# Patient Record
Sex: Female | Born: 1950 | Race: White | Hispanic: No | Marital: Married | Smoking: Never smoker
Health system: Southern US, Community
[De-identification: ages and names within clinical notes are randomized; demographics above are authoritative.]

## PROBLEM LIST (undated history)

## (undated) DIAGNOSIS — D759 Disease of blood and blood-forming organs, unspecified: Secondary | ICD-10-CM

## (undated) DIAGNOSIS — K219 Gastro-esophageal reflux disease without esophagitis: Secondary | ICD-10-CM

## (undated) DIAGNOSIS — D1803 Hemangioma of intra-abdominal structures: Secondary | ICD-10-CM

## (undated) DIAGNOSIS — Z9289 Personal history of other medical treatment: Secondary | ICD-10-CM

## (undated) DIAGNOSIS — I10 Essential (primary) hypertension: Secondary | ICD-10-CM

## (undated) DIAGNOSIS — Z87898 Personal history of other specified conditions: Secondary | ICD-10-CM

## (undated) DIAGNOSIS — C50919 Malignant neoplasm of unspecified site of unspecified female breast: Secondary | ICD-10-CM

## (undated) DIAGNOSIS — E1169 Type 2 diabetes mellitus with other specified complication: Secondary | ICD-10-CM

## (undated) DIAGNOSIS — M1990 Unspecified osteoarthritis, unspecified site: Secondary | ICD-10-CM

## (undated) DIAGNOSIS — Z87442 Personal history of urinary calculi: Secondary | ICD-10-CM

## (undated) DIAGNOSIS — E669 Obesity, unspecified: Secondary | ICD-10-CM

## (undated) HISTORY — DX: Obesity, unspecified: E66.9

## (undated) HISTORY — PX: DILATION AND CURETTAGE OF UTERUS: SHX78

## (undated) HISTORY — DX: Type 2 diabetes mellitus with other specified complication: E11.69

## (undated) HISTORY — PX: CATARACT EXTRACTION, BILATERAL: SHX1313

## (undated) HISTORY — PX: TONSILLECTOMY: SUR1361

## (undated) HISTORY — PX: LITHOTRIPSY: SUR834

## (undated) HISTORY — PX: OTHER SURGICAL HISTORY: SHX169

## (undated) HISTORY — DX: Malignant neoplasm of unspecified site of unspecified female breast: C50.919

## (undated) HISTORY — PX: ABDOMINAL HYSTERECTOMY: SHX81

---

## 2003-12-10 ENCOUNTER — Other Ambulatory Visit: Admission: RE | Admit: 2003-12-10 | Discharge: 2003-12-10 | Payer: Self-pay | Admitting: Family Medicine

## 2005-01-30 ENCOUNTER — Other Ambulatory Visit: Admission: RE | Admit: 2005-01-30 | Discharge: 2005-01-30 | Payer: Self-pay | Admitting: Family Medicine

## 2005-07-09 ENCOUNTER — Ambulatory Visit (HOSPITAL_COMMUNITY): Admission: RE | Admit: 2005-07-09 | Discharge: 2005-07-09 | Payer: Self-pay | Admitting: Gastroenterology

## 2005-07-09 ENCOUNTER — Encounter (INDEPENDENT_AMBULATORY_CARE_PROVIDER_SITE_OTHER): Payer: Self-pay | Admitting: Specialist

## 2006-02-13 ENCOUNTER — Other Ambulatory Visit: Admission: RE | Admit: 2006-02-13 | Discharge: 2006-02-13 | Payer: Self-pay | Admitting: Family Medicine

## 2006-08-09 ENCOUNTER — Ambulatory Visit (HOSPITAL_COMMUNITY): Admission: RE | Admit: 2006-08-09 | Discharge: 2006-08-09 | Payer: Self-pay | Admitting: *Deleted

## 2006-08-09 ENCOUNTER — Encounter (INDEPENDENT_AMBULATORY_CARE_PROVIDER_SITE_OTHER): Payer: Self-pay | Admitting: Specialist

## 2008-03-02 ENCOUNTER — Other Ambulatory Visit: Admission: RE | Admit: 2008-03-02 | Discharge: 2008-03-02 | Payer: Self-pay | Admitting: Family Medicine

## 2008-10-29 HISTORY — PX: DILATION AND CURETTAGE OF UTERUS: SHX78

## 2010-04-21 ENCOUNTER — Other Ambulatory Visit: Admission: RE | Admit: 2010-04-21 | Discharge: 2010-04-21 | Payer: Self-pay | Admitting: Family Medicine

## 2010-11-02 ENCOUNTER — Emergency Department (HOSPITAL_COMMUNITY)
Admission: EM | Admit: 2010-11-02 | Discharge: 2010-11-02 | Payer: Self-pay | Source: Home / Self Care | Admitting: Emergency Medicine

## 2010-11-02 LAB — URINE MICROSCOPIC-ADD ON

## 2010-11-02 LAB — URINALYSIS, ROUTINE W REFLEX MICROSCOPIC
Ketones, ur: NEGATIVE mg/dL
Nitrite: NEGATIVE
Protein, ur: NEGATIVE mg/dL
Urine Glucose, Fasting: NEGATIVE mg/dL
Urobilinogen, UA: 0.2 mg/dL (ref 0.0–1.0)
pH: 6 (ref 5.0–8.0)

## 2010-11-09 ENCOUNTER — Ambulatory Visit (HOSPITAL_COMMUNITY)
Admission: RE | Admit: 2010-11-09 | Discharge: 2010-11-09 | Payer: Self-pay | Source: Home / Self Care | Attending: Urology | Admitting: Urology

## 2010-11-22 ENCOUNTER — Encounter
Admission: RE | Admit: 2010-11-22 | Discharge: 2010-11-22 | Payer: Self-pay | Source: Home / Self Care | Attending: Family Medicine | Admitting: Family Medicine

## 2011-03-16 NOTE — Op Note (Signed)
NAMEPARISS, Morgan Frye               ACCOUNT NO.:  [id number]   MEDICAL RECORD NO.:  [id number]          PATIENT TYPE:  AMB   LOCATION:  ENDO                         FACILITY:  [facility]   PHYSICIAN:  Anselmo Rod, M.D.  DATE OF BIRTH:  1951/02/07   DATE OF PROCEDURE:  07/09/2005  DATE OF DISCHARGE:                                 OPERATIVE REPORT   PROCEDURE PERFORMED:  Screening colonoscopy.   ENDOSCOPIST:  Charna Elizabeth, M.D.   INSTRUMENT USED:  Olympus video colonoscope.   INDICATIONS FOR PROCEDURE:  A 60 year old white female undergoing a  screening colonoscopy to rule out colonic polyps, masses, etc.   PREPROCEDURE PREPARATION:  Informed consent was procured from the patient.  The patient was fasted for eight hours prior to the procedure and prepped  with a bottle of magnesium citrate and a gallon of GoLYTELY the night prior  to the procedure.  The risks and benefits of the procedure including a 10%  miss rate for colon polyps or cancers was discussed with the patient as  well.   PREPROCEDURE PHYSICAL:  The patient had stable vital signs.  Neck supple.  Chest clear to auscultation.  S1 and S2 regular.  Abdomen soft with normal  bowel sounds.   DESCRIPTION OF PROCEDURE:  The patient was placed in left lateral decubitus  position and sedated with 75 mg of Demerol and 10 mg of Versed in slow  incremental doses.  Once the patient was adequately sedated and maintained  on low flow oxygen and continuous cardiac monitoring, the Olympus video  colonoscope was advanced from the rectum to the cecum.  The appendicular  orifice and ileocecal valve appeared healthy.  Multiple washes were done.  The terminal ileum appeared normal.  Two small sessile polyps were removed  by cold biopsy forceps from the anal verge and the rectum.  An isolated  diverticulum was seen in the left colon.  The rest of the exam was  unremarkable.   IMPRESSION:  1.  Two small sessile polyps removed from the  rectum at the anal verge by      cold biopsy forceps (cold biopsies times two).  2.  Isolated diverticula with midleft colon.  3.  Normal-appearing transverse colon, right colon, cecum and terminal      ileum.   RECOMMENDATIONS:  1.  Await pathology results.  2.  Avoid all nonsteroidals including aspirin for the next two weeks.  3.  Repeat colonoscopy depending on pathology results.  4.  Outpatient followup as need arises in the future.  5.  A high fiber diet with liberal fluid intake has been emphasized.      Anselmo Rod, M.D.  Electronically Signed     JNM/MEDQ  D:  07/09/2005  T:  07/09/2005  Job:  [id number]   cc:   Otilio Connors. Gerri Spore, M.D.  [address]  [address]  [place]  [place] [address]  Fax: [phone]

## 2011-03-16 NOTE — Op Note (Signed)
[name], Morgan Frye               ACCOUNT NO.:  [id number]   MEDICAL RECORD NO.:  [id number]          PATIENT TYPE:  AMB   LOCATION:  SDC                           FACILITY:  [facility]   PHYSICIAN:  Pershing Cox, M.D.DATE OF BIRTH:  1951/09/27   DATE OF PROCEDURE:  08/09/2006  DATE OF DISCHARGE:                                 OPERATIVE REPORT   PREOPERATIVE DIAGNOSES:  1. Postmenopausal bleeding.  2. Endometrial polyp on hysterogram.   POSTOPERATIVE DIAGNOSIS:  Endometrial polyps arising from fundus.   OPERATION/PROCEDURE:  1. Exam under anesthesia.  2. Fractional dilatation and curettage.  3. Hysteroscopy with resection of polyp.   SURGEON:  Pershing Cox, M.D.   ASSISTANT:  None.   ANESTHESIA:  MAC by LMA and Marcaine paracervical block.   SPECIMENS:  Cervical curettings, combined endometrial polyp and endometrial  curettings.   INDICATIONS FOR PROCEDURE:  The patient has recently had some problems with  postmenopausal bleeding.  She is menopausal.  A sonogram performed by her  doctor, Dr. Clyde Canterbury showed an increased endometrial stripe and her  sonogram showed endometrial polyp arising from the fundus and after  counseling, she has agreed top present for resection of this polyp.  Endometrial biopsy performed on April 04, 2006 was benign.   DESCRIPTION OF PROCEDURE:  The patient was brought to the operating room  with an IV in place.  She received 400 mg of vancomycin in the holding area.  Supine on the OR table, IV sedation was administered.  LMA was placed  without difficulty.  Once totally asleep she was placed in the [place]  stirrups and prepped with Betadine in lower abdomen and perineum and vagina.  She was then draped for sterile vaginal procedure. and a collecting  deape__________ , was placed beneath her hip so that we could adequately  measure the _effluent_________ .  During the Betadine prep, I was able do an  exam under anesthesia with the finding of  a small anteflexed uterus and no  adnexal masses.   Bivalve speculum was inserted into the vagina.  Cervix was visualized and  0.25% Marcaine was injected into the anterior cervix which was then grasped  with a tenaculum.  Marcaine was injected at the 3, 4, 7, and 8 positions  using a total volume of 20 mL of 0.25% Marcaine.  A Kevorkian curet was used  to obtain endocervical curettings onto a Telfa and these were submitted  separately.  A sound passed to 9.25 cm.  Serial Pratt dilators were used to  dilate to size 15 and the diagnostic scope was introduced.  Photographs were  taken of the endometrial cavity and the decision was made to proceed with  resection.  The cervix was further dilated to size 31.  Resectoscope was  introduced and using through and through sorbitol irrigation, the cavity was  visualized using a single loop wire, the polyp fragments were resected.  The  large polyps were removed from the uterus with polyp forceps.  Once the  polyps had been resected, the small sharp curet was used to curet the  endometrial wall and the _meigs curette________ was used to do it likewise  again onto a separate Telfa.  There was no active bleeding even when the  tenaculum was removed.  The patient was taken to the recovery room in very  good condition.  Before completing the procedure, the uterus sounded again  to 9 cm.      Pershing Cox, M.D.  Electronically Signed     MAJ/MEDQ  D:  08/09/2006  T:  08/10/2006  Job:  [id number]   cc:   Otilio Connors. Gerri Spore, M.D.  Fax: [phone]

## 2011-04-25 ENCOUNTER — Other Ambulatory Visit: Payer: Self-pay | Admitting: Family Medicine

## 2011-04-25 DIAGNOSIS — R160 Hepatomegaly, not elsewhere classified: Secondary | ICD-10-CM

## 2011-06-20 ENCOUNTER — Other Ambulatory Visit: Payer: Self-pay

## 2011-06-22 ENCOUNTER — Ambulatory Visit
Admission: RE | Admit: 2011-06-22 | Discharge: 2011-06-22 | Disposition: A | Discharge status: Home / Self Care | Payer: BC Managed Care – PPO | Source: Ambulatory Visit | Attending: Family Medicine | Admitting: Family Medicine

## 2011-06-22 ENCOUNTER — Other Ambulatory Visit: Payer: Self-pay | Admitting: Family Medicine

## 2011-06-22 DIAGNOSIS — R160 Hepatomegaly, not elsewhere classified: Secondary | ICD-10-CM

## 2011-07-07 IMAGING — US US ABDOMEN COMPLETE
1 series · 13 of 25 positions shown · non-contrast
Comparison: CT from [HOSPITAL] on 08/18/2010

CLINICAL DATA: Liver mass seen on outside CT.  Nephrolithiasis.

ABDOMINAL ULTRASOUND COMPLETE

[Series 1: us abdomen complete · 13 of 91 slices shown]
[im 1/91]
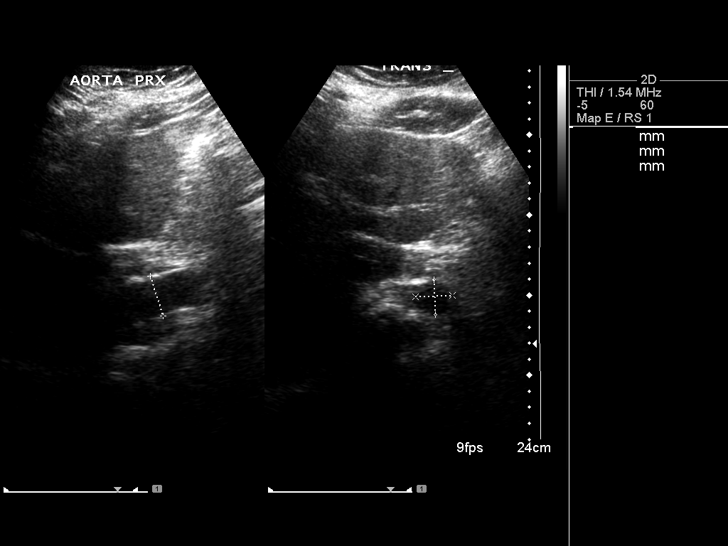
[im 8/91]
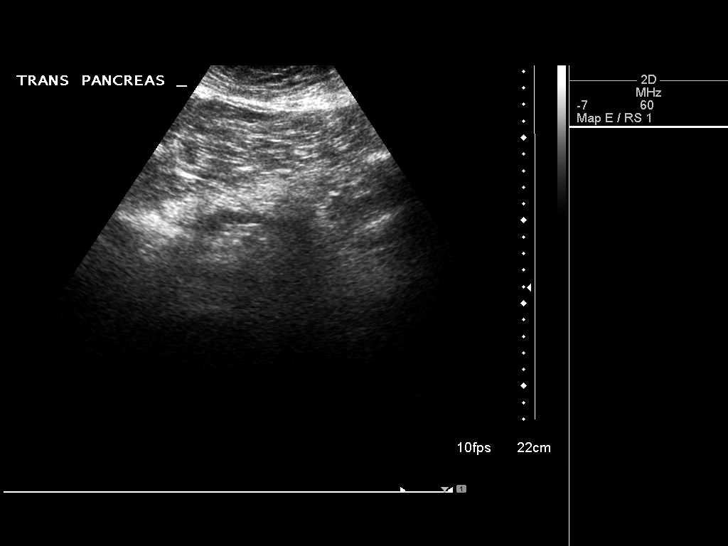
[im 16/91]
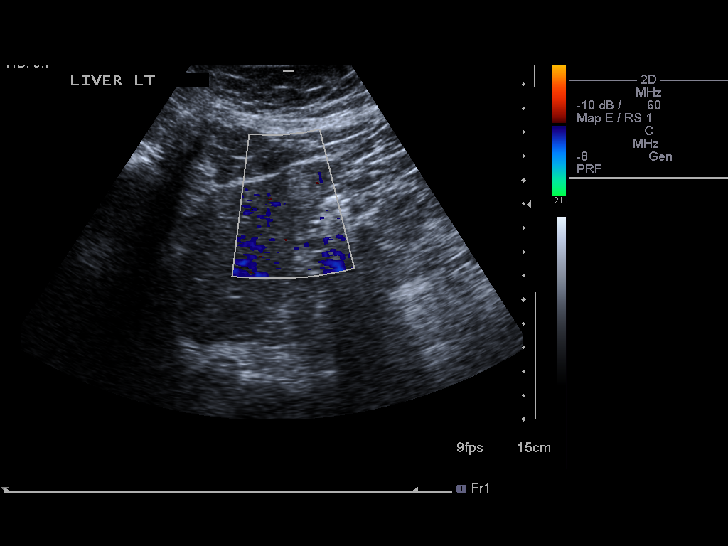
[im 23/91]
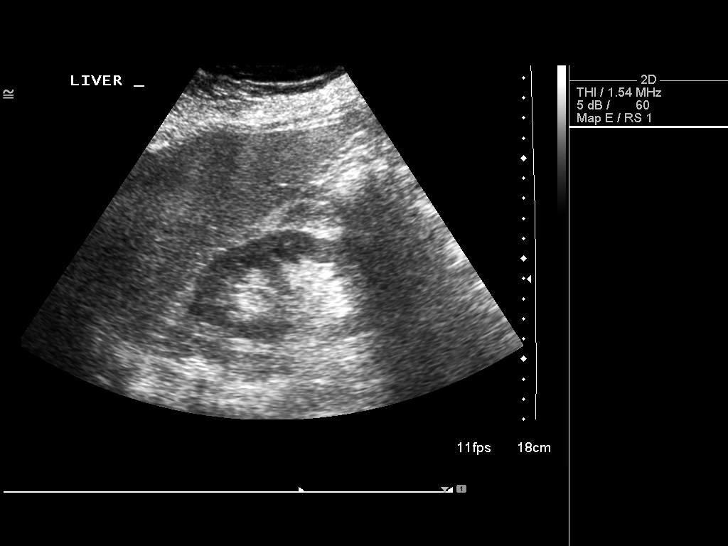
[im 31/91]
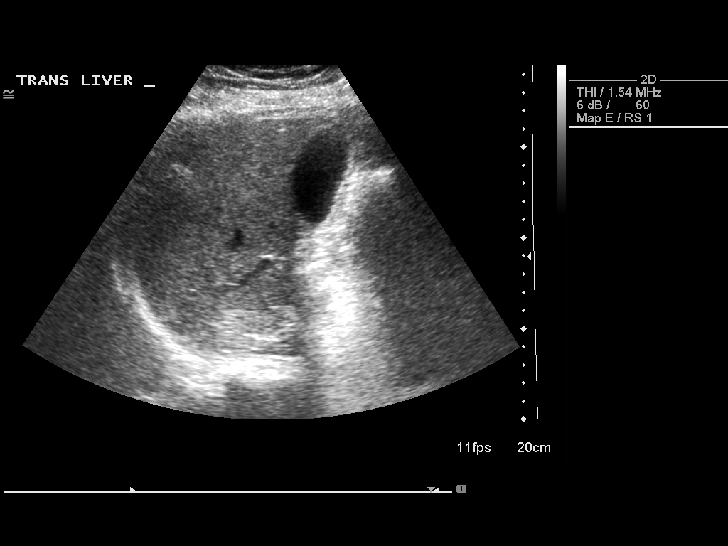
[im 38/91]
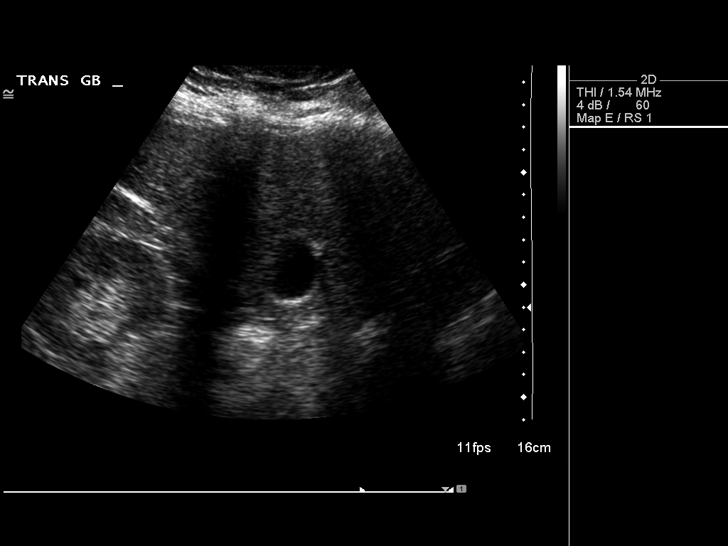
[im 46/91]
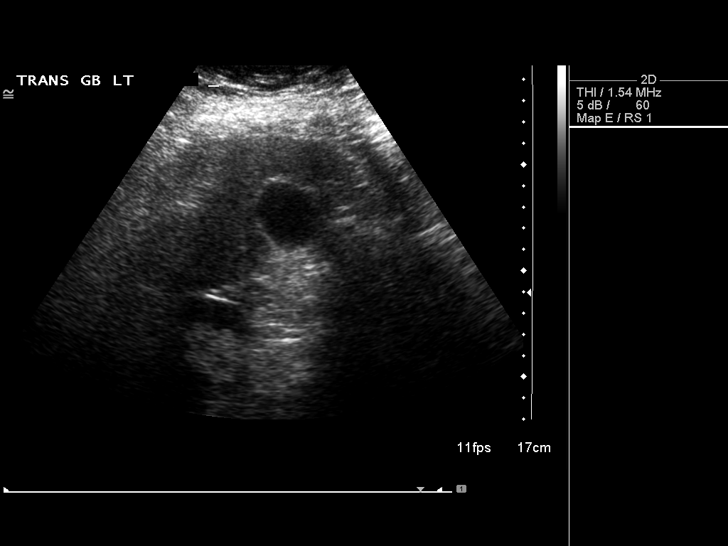
[im 53/91]
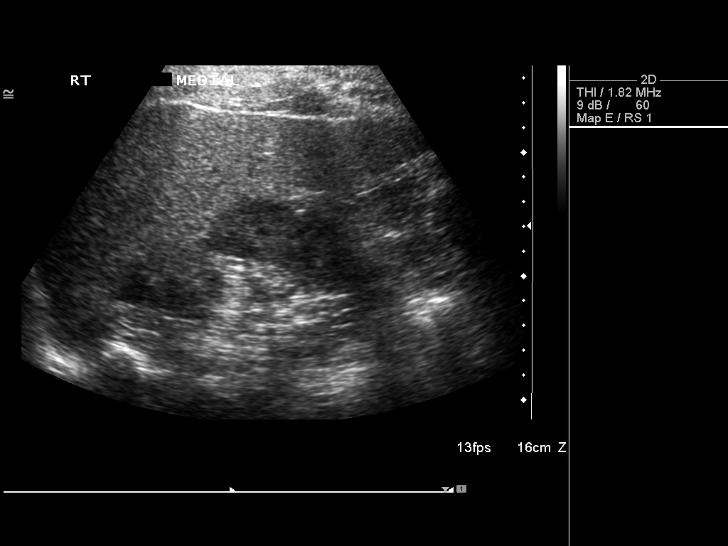
[im 61/91]
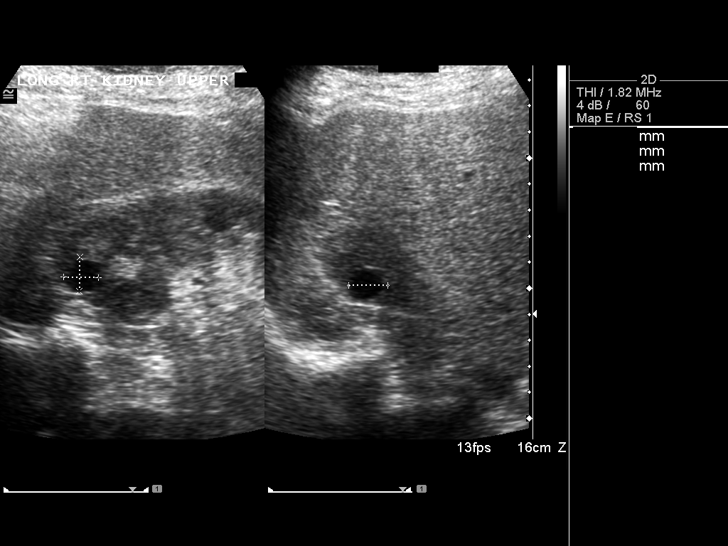
[im 68/91]
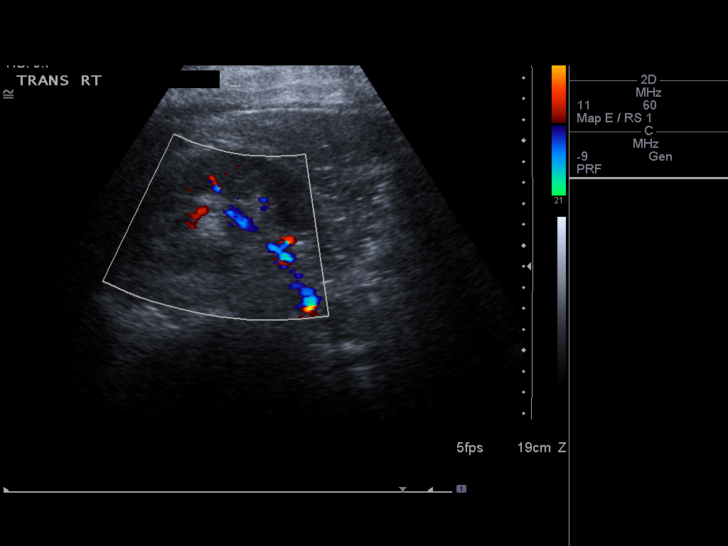
[im 76/91]
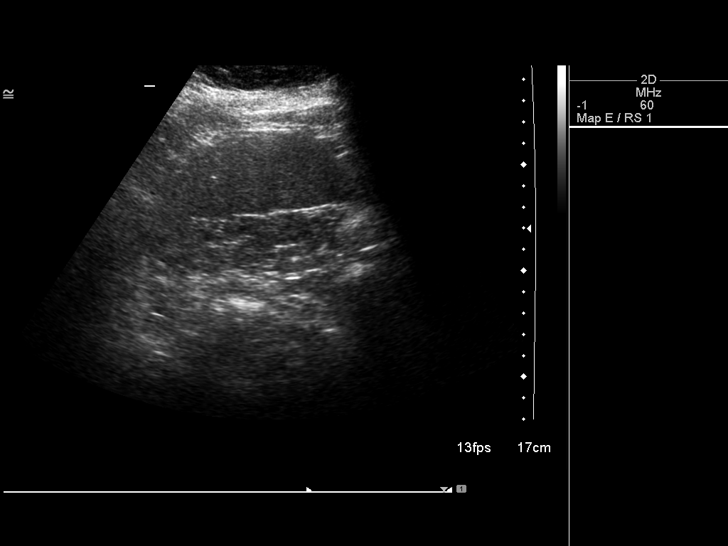
[im 83/91]
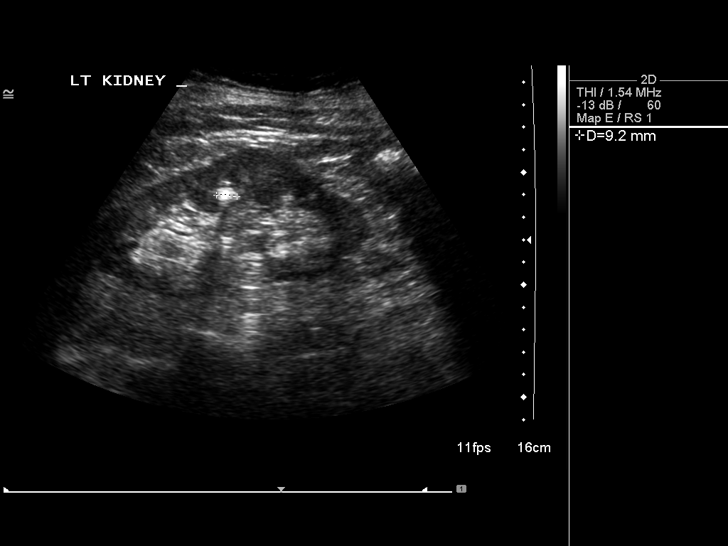
[im 91/91]
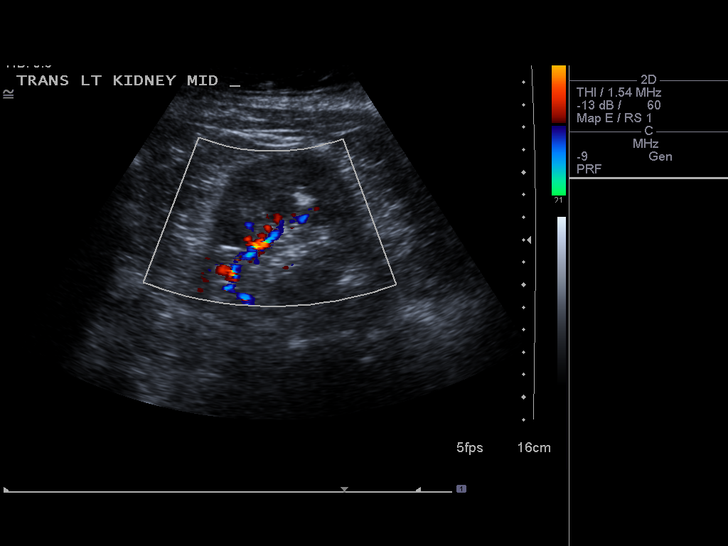

[13 of 25 positions shown; findings below may reference images not displayed]

FINDINGS: Gallbladder:  No gallstones, gallbladder wall thickening, or
pericholecystic fluid.

Common Bile Duct:  Within normal limits in caliber.  Measures 3 mm
in diameter.

Liver: A hyperechoic lesion is seen in the lateral segment of the
left hepatic lobe which measures 2.3 cm.  This corresponds to the
lesion seen on recent CT, although it measures larger than the
cm on that exam.  No other liver masses are identified.

IVC:  Appears normal.

Pancreas:  No abnormality identified.

Spleen:  Within normal limits in size and echotexture.

Right kidney:  Normal in size and parenchymal echogenicity.  No
evidence of mass or hydronephrosis.  A small less than 1 cm
calculus noted in the mid pole and 1.5 cm cysts seen in the upper
pole.

Left kidney:  Normal in size and parenchymal echogenicity.  No
evidence of mass or hydronephrosis.  A 1 cm calculus is seen in the
mid pole.

Abdominal Aorta:  No aneurysm identified.
IMPRESSION: 1.  2.3 cm hyperechoic lesion in the lateral segment of the left
hepatic lobe, consistent with a benign hemangioma.  This measures
larger than on the previous CT, however, this likely due to
technical differences between the two exams.  Follow-up by
ultrasound is recommended in 6 months to confirm stability over a
longer period of time.
2.  Bilateral nephrolithiasis.  No evidence of hydronephrosis.

## 2012-02-04 IMAGING — US US ABDOMEN LIMITED
1 series · 14 of 24 positions shown · non-contrast
Comparison: Ultrasound 11/22/2010 and CT [DATE]

CLINICAL DATA: Follow-up liver lesion seen on prior exams

LIMITED ABDOMINAL ULTRASOUND

[Series 1: us abdomen limited · 0.24mm/px · 14 of 24 slices shown]
[im 1/24]
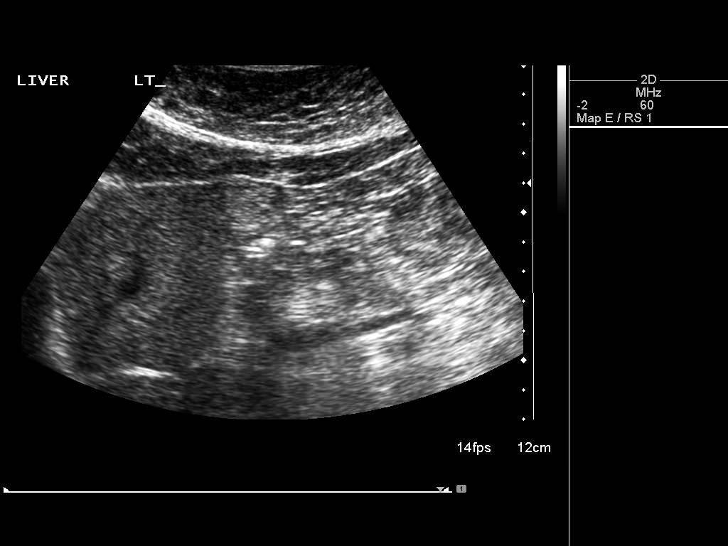
[im 3/24]
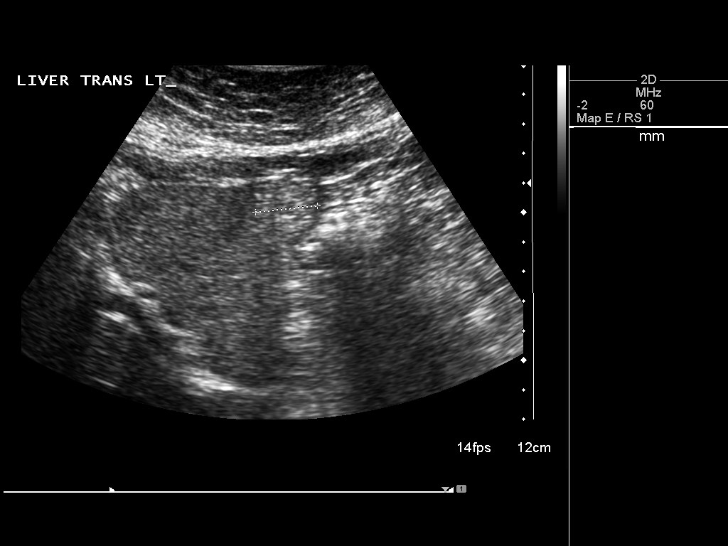
[im 5/24]
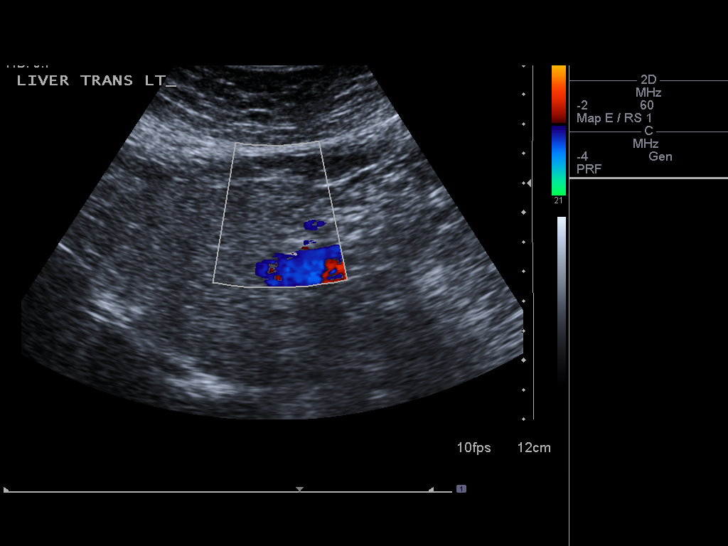
[im 7/24]
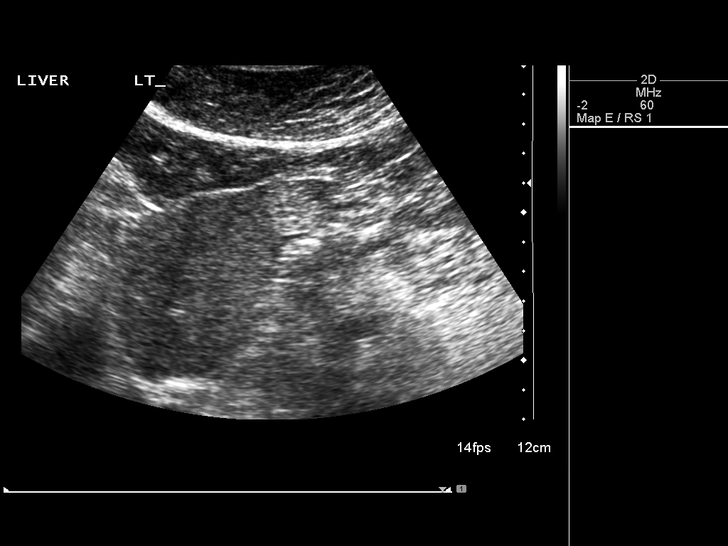
[im 8/24]
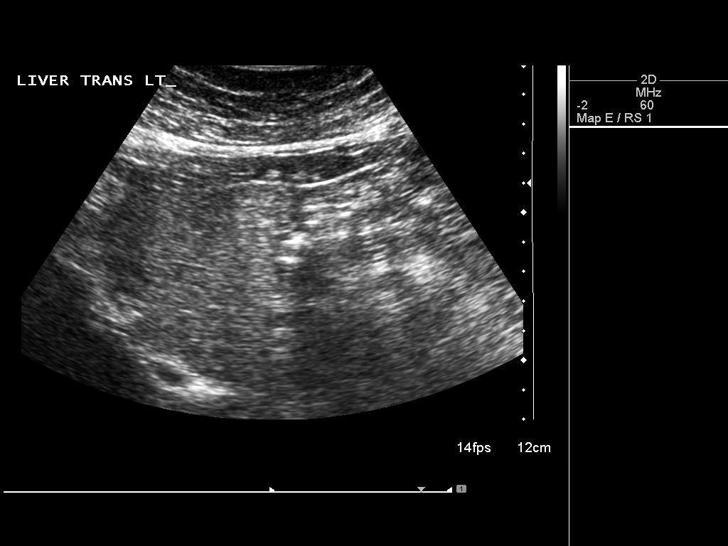
[im 10/24]
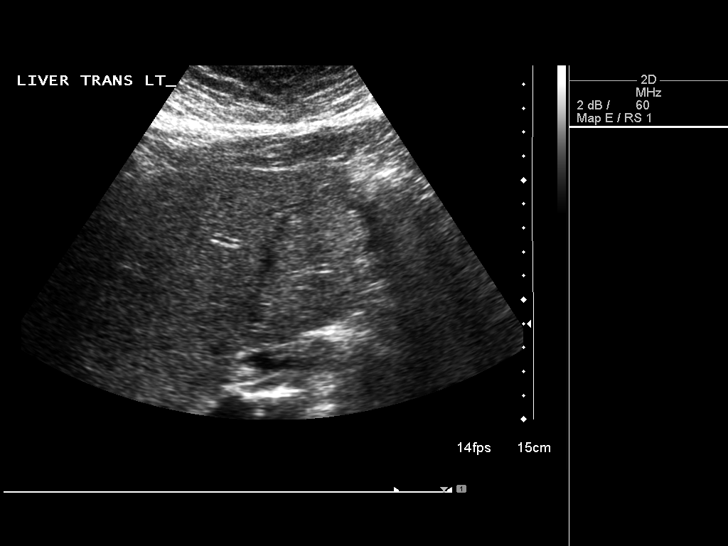
[im 12/24]
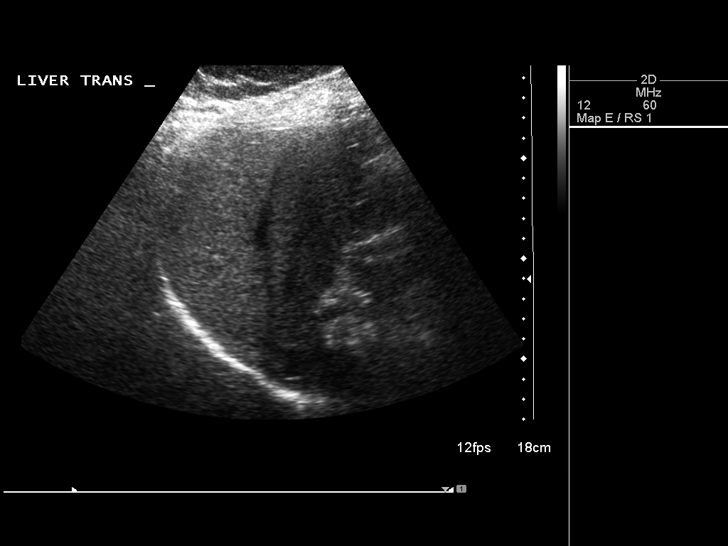
[im 13/24]
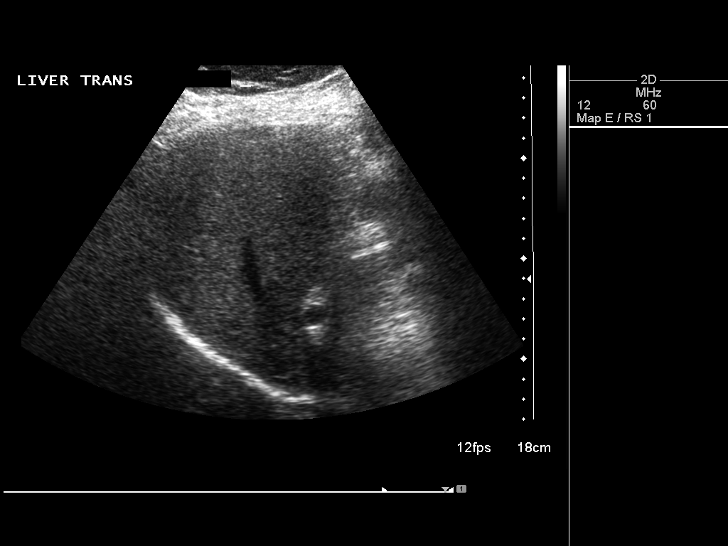
[im 15/24]
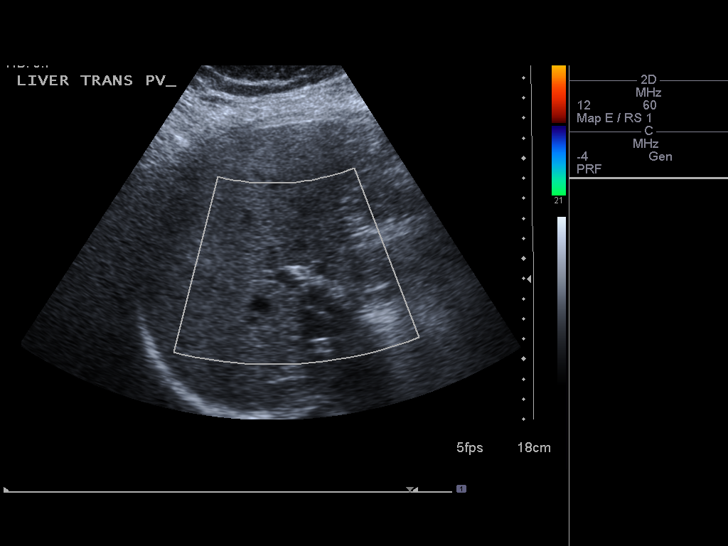
[im 17/24]
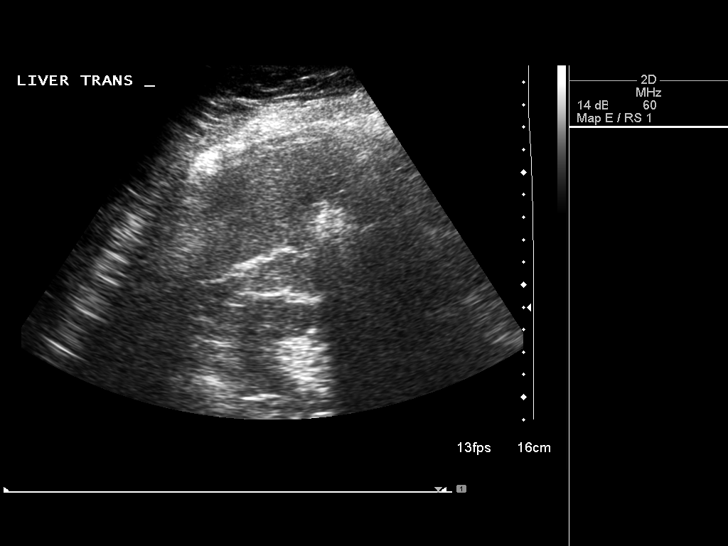
[im 19/24]
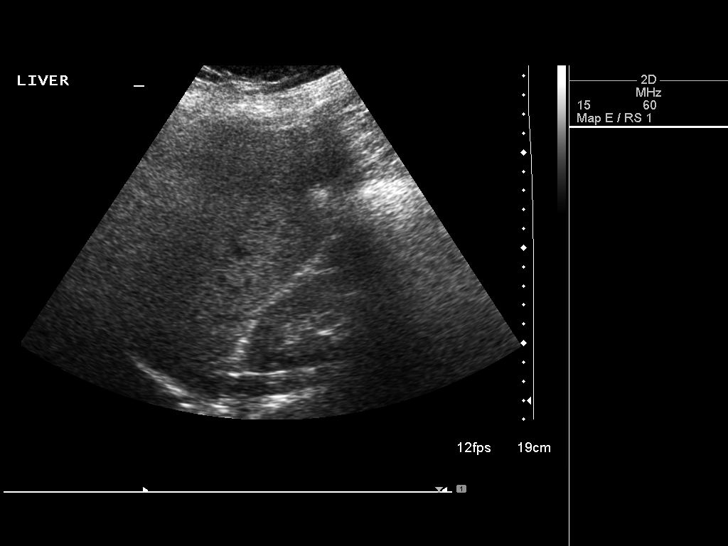
[im 20/24]
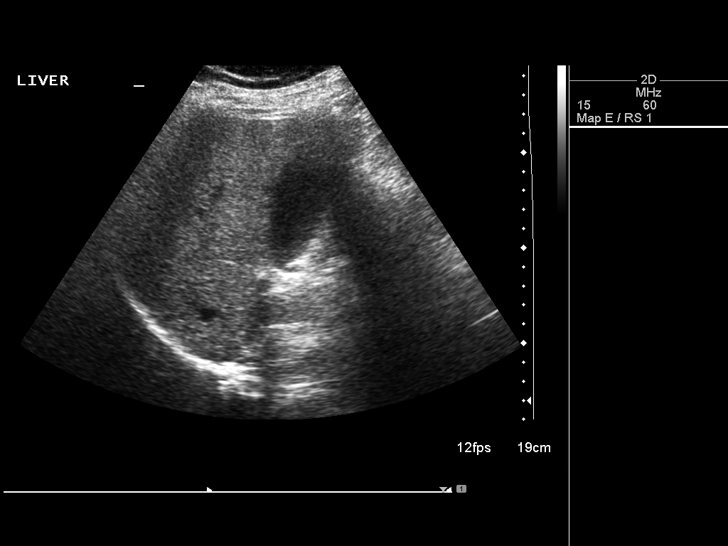
[im 22/24]
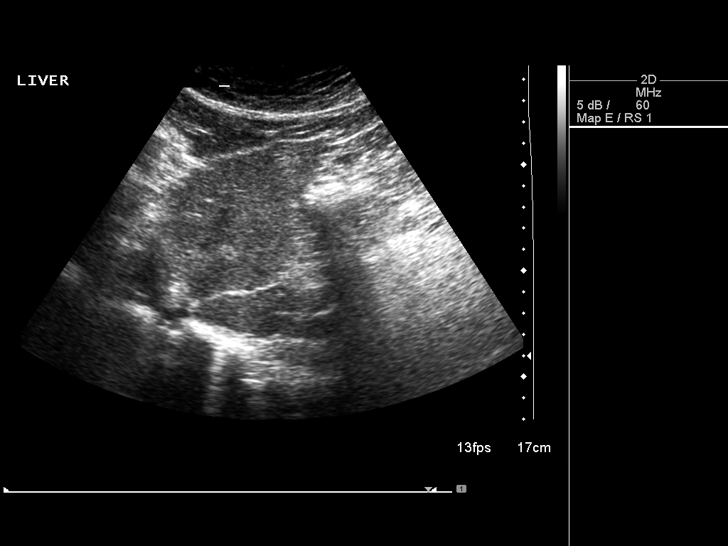
[im 24/24]
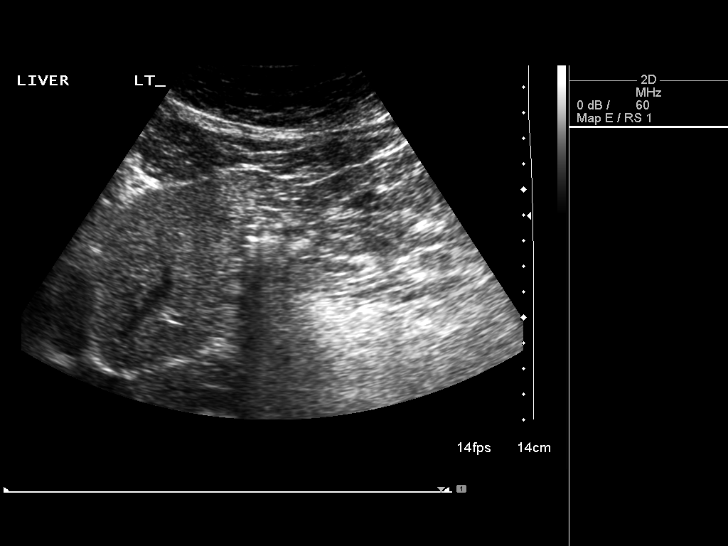

[14 of 24 positions shown; findings below may reference images not displayed]

FINDINGS: Again noted is an echogenic mass in the left lobe of the
liver which measures 2.2 x 1.8 x 2.3 cm when measured in a similar
plain to the prior study.  This is stable in comparison and would
correlate with the previous impression of a hepatic hemangioma. No
new focal hepatic lesions are identified.  No intrahepatic ductal
dilatation is seen.
IMPRESSION: Stable echogenic focus in the left lobe of the liver as noted above
most compatible with a benign hepatic hemangioma. If there is no
underlying history of cancer, no further follow-up is necessary.

## 2012-05-13 ENCOUNTER — Other Ambulatory Visit (HOSPITAL_COMMUNITY)
Admission: RE | Admit: 2012-05-13 | Discharge: 2012-05-13 | Disposition: A | Discharge status: Home / Self Care | Payer: BC Managed Care – PPO | Source: Ambulatory Visit | Attending: Family Medicine | Admitting: Family Medicine

## 2012-05-13 ENCOUNTER — Other Ambulatory Visit: Payer: Self-pay | Admitting: Family Medicine

## 2012-05-13 DIAGNOSIS — Z124 Encounter for screening for malignant neoplasm of cervix: Secondary | ICD-10-CM | POA: Insufficient documentation

## 2014-03-22 ENCOUNTER — Other Ambulatory Visit: Payer: Self-pay | Admitting: Orthopedic Surgery

## 2014-04-05 ENCOUNTER — Inpatient Hospital Stay: Admit: 2014-04-05 | Payer: Self-pay | Admitting: Orthopedic Surgery

## 2014-04-05 SURGERY — ARTHROPLASTY, KNEE, TOTAL
Anesthesia: Choice | Site: Knee | Laterality: Right

## 2014-05-05 ENCOUNTER — Encounter (HOSPITAL_COMMUNITY): Payer: Self-pay | Admitting: Pharmacy Technician

## 2014-05-12 ENCOUNTER — Encounter (HOSPITAL_COMMUNITY)
Admission: RE | Admit: 2014-05-12 | Discharge: 2014-05-12 | Disposition: A | Discharge status: Home / Self Care | Payer: BC Managed Care – PPO | Source: Ambulatory Visit | Attending: Orthopedic Surgery | Admitting: Orthopedic Surgery

## 2014-05-12 ENCOUNTER — Ambulatory Visit (HOSPITAL_COMMUNITY)
Admission: RE | Admit: 2014-05-12 | Discharge: 2014-05-12 | Disposition: A | Discharge status: Home / Self Care | Payer: BC Managed Care – PPO | Source: Ambulatory Visit | Attending: Orthopedic Surgery | Admitting: Orthopedic Surgery

## 2014-05-12 ENCOUNTER — Encounter (HOSPITAL_COMMUNITY): Payer: Self-pay

## 2014-05-12 DIAGNOSIS — I10 Essential (primary) hypertension: Secondary | ICD-10-CM | POA: Insufficient documentation

## 2014-05-12 DIAGNOSIS — Z01818 Encounter for other preprocedural examination: Secondary | ICD-10-CM | POA: Insufficient documentation

## 2014-05-12 DIAGNOSIS — Z01810 Encounter for preprocedural cardiovascular examination: Secondary | ICD-10-CM | POA: Insufficient documentation

## 2014-05-12 DIAGNOSIS — Z01812 Encounter for preprocedural laboratory examination: Secondary | ICD-10-CM | POA: Insufficient documentation

## 2014-05-12 HISTORY — DX: Personal history of other medical treatment: Z92.89

## 2014-05-12 HISTORY — DX: Unspecified osteoarthritis, unspecified site: M19.90

## 2014-05-12 HISTORY — DX: Personal history of urinary calculi: Z87.442

## 2014-05-12 HISTORY — DX: Hemangioma of intra-abdominal structures: D18.03

## 2014-05-12 HISTORY — DX: Gastro-esophageal reflux disease without esophagitis: K21.9

## 2014-05-12 HISTORY — DX: Personal history of other specified conditions: Z87.898

## 2014-05-12 HISTORY — DX: Essential (primary) hypertension: I10

## 2014-05-12 LAB — APTT: aPTT: 31 seconds (ref 24–37)

## 2014-05-12 LAB — PROTIME-INR
INR: 1.32 (ref 0.00–1.49)
Prothrombin Time: 16.4 seconds — ABNORMAL HIGH (ref 11.6–15.2)

## 2014-05-12 LAB — URINALYSIS, ROUTINE W REFLEX MICROSCOPIC
Glucose, UA: NEGATIVE mg/dL
Hgb urine dipstick: NEGATIVE
Ketones, ur: NEGATIVE mg/dL
NITRITE: NEGATIVE
PH: 6 (ref 5.0–8.0)
Protein, ur: NEGATIVE mg/dL
SPECIFIC GRAVITY, URINE: 1.021 (ref 1.005–1.030)
Urobilinogen, UA: 0.2 mg/dL (ref 0.0–1.0)

## 2014-05-12 LAB — CBC
HCT: 47.3 % — ABNORMAL HIGH (ref 36.0–46.0)
Hemoglobin: 17 g/dL — ABNORMAL HIGH (ref 12.0–15.0)
MCH: 33.2 pg (ref 26.0–34.0)
MCHC: 35.9 g/dL (ref 30.0–36.0)
MCV: 92.4 fL (ref 78.0–100.0)
PLATELETS: 180 10*3/uL (ref 150–400)
RBC: 5.12 MIL/uL — AB (ref 3.87–5.11)
RDW: 12.8 % (ref 11.5–15.5)
WBC: 5.4 10*3/uL (ref 4.0–10.5)

## 2014-05-12 LAB — COMPREHENSIVE METABOLIC PANEL
ALT: 82 U/L — AB (ref 0–35)
AST: 59 U/L — AB (ref 0–37)
Albumin: 4.1 g/dL (ref 3.5–5.2)
Alkaline Phosphatase: 89 U/L (ref 39–117)
Anion gap: 15 (ref 5–15)
BUN: 18 mg/dL (ref 6–23)
CALCIUM: 10.3 mg/dL (ref 8.4–10.5)
CO2: 23 meq/L (ref 19–32)
Chloride: 105 mEq/L (ref 96–112)
Creatinine, Ser: 0.71 mg/dL (ref 0.50–1.10)
GFR calc Af Amer: >90 mL/min (ref >90)
GFR calc non Af Amer: >90 mL/min (ref >90)
Glucose, Bld: 136 mg/dL — ABNORMAL HIGH (ref 70–99)
Potassium: 4 mEq/L (ref 3.7–5.3)
SODIUM: 143 meq/L (ref 137–147)
Total Bilirubin: 0.9 mg/dL (ref 0.3–1.2)
Total Protein: 7.3 g/dL (ref 6.0–8.3)

## 2014-05-12 LAB — URINE MICROSCOPIC-ADD ON

## 2014-05-12 LAB — SURGICAL PCR SCREEN
MRSA, PCR: NEGATIVE
Staphylococcus aureus: NEGATIVE

## 2014-05-12 NOTE — Pre-Procedure Instructions (Addendum)
05-12-14 EKG/CXR done today. Clearance note 5'15 Dr. Justin Mend- with chart. Labs viewable in epic, note urinalysis. 05-20-14 1401 faxed note from Dr. Anne Fu office" no Action"-labs noted.W.Teigen Bellin,RN

## 2014-05-12 NOTE — Progress Notes (Signed)
05-12-14 [place] in [place], note urinalysis.

## 2014-05-12 NOTE — Patient Instructions (Signed)
20 Morgan Frye  05/12/2014   Your procedure is scheduled on: 7-27  -2015  Enter through [facility] Entrance and follow signs to [facility]. Arrive at     0515   AM .  Call this number if you have problems the morning of surgery: [phone]  Or Presurgical Testing [phone](Morgan Frye) For Living Will and/or Health Care Power Attorney Forms: please provide copy for your medical record,may bring AM of surgery(Forms should be already notarized -we do not provide this service).(05-12-14 Yes/ will bring Living Will if not connected to financial will -05-24-14).     Do not eat food:After Midnight.    Take these medicines the morning of surgery with A SIP OF WATER: Tylenol if needed.   Do not wear jewelry, make-up or nail polish.  Do not wear lotions, powders, or perfumes. You may wear deodorant.  Do not shave 48 hours(2 days) prior to first CHG shower(legs and under arms).(Shaving face and neck okay.)  Do not bring valuables to the hospital.(Hospital is not responsible for lost valuables).  Contacts, dentures or removable bridgework, body piercing, hair pins may not be worn into surgery.  Leave suitcase in the car. After surgery it may be brought to your room.  For patients admitted to the hospital, checkout time is 11:00 AM the day of discharge.(Restricted visitors-Any Persons displaying flu-like symptoms or illness).    Patients discharged the day of surgery will not be allowed to drive home. Must have responsible person with you x 24 hours once discharged.  Name and phone number of your driver: Morgan Frye, spouse [phone] cell  Special Instructions: CHG(Chlorhedine 4%-"Hibiclens","Betasept","Aplicare") Shower Use Special Wash: see special instructions.(avoid face and genitals)   Please read over the following fact sheets that you were given: MRSA Information, Blood Transfusion fact sheet, Incentive Spirometry Instruction.  Remember : Type/Screen "Blue armbands" -  may not be removed once applied(would result in being retested AM of surgery, if removed).    ___________________________    [facility] - Preparing for Surgery Before surgery, you can play an important role.  Because skin is not sterile, your skin needs to be as free of germs as possible.  You can reduce the number of germs on your skin by washing with CHG (chlorahexidine gluconate) soap before surgery.  CHG is an antiseptic cleaner which kills germs and bonds with the skin to continue killing germs even after washing. Please DO NOT use if you have an allergy to CHG or antibacterial soaps.  If your skin becomes reddened/irritated stop using the CHG and inform your nurse when you arrive at Short Stay. Do not shave (including legs and underarms) for at least 48 hours prior to the first CHG shower.  You may shave your face/neck. Please follow these instructions carefully:  1.  Shower with CHG Soap the night before surgery and the  morning of Surgery.  2.  If you choose to wash your hair, wash your hair first as usual with your  normal  shampoo.  3.  After you shampoo, rinse your hair and body thoroughly to remove the  shampoo.                           4.  Use CHG as you would any other liquid soap.  You can apply chg directly  to the skin and wash  Gently with a scrungie or clean washcloth.  5.  Apply the CHG Soap to your body ONLY FROM THE NECK DOWN.   Do not use on face/ open                           Wound or open sores. Avoid contact with eyes, ears mouth and genitals (private parts).                       Wash face,  Genitals (private parts) with your normal soap.             6.  Wash thoroughly, paying special attention to the area where your surgery  will be performed.  7.  Thoroughly rinse your body with warm water from the neck down.  8.  DO NOT shower/wash with your normal soap after using and rinsing off  the CHG Soap.                9.  Pat yourself dry with a clean  towel.            10.  Wear clean pajamas.            11.  Place clean sheets on your bed the night of your first shower and do not  sleep with pets. Day of Surgery : Do not apply any lotions/deodorants the morning of surgery.  Please wear clean clothes to the hospital/surgery center.  FAILURE TO FOLLOW THESE INSTRUCTIONS MAY RESULT IN THE CANCELLATION OF YOUR SURGERY PATIENT SIGNATURE_________________________________  NURSE SIGNATURE__________________________________  ________________________________________________________________________   Morgan Frye  An incentive spirometer is a tool that can help keep your lungs clear and active. This tool measures how well you are filling your lungs with each breath. Taking long deep breaths may help reverse or decrease the chance of developing breathing (pulmonary) problems (especially infection) following:  A long period of time when you are unable to move or be active. BEFORE THE PROCEDURE   If the spirometer includes an indicator to show your best effort, your nurse or respiratory therapist will set it to a desired goal.  If possible, sit up straight or lean slightly forward. Try not to slouch.  Hold the incentive spirometer in an upright position. INSTRUCTIONS FOR USE  1. Sit on the edge of your bed if possible, or sit up as far as you can in bed or on a chair. 2. Hold the incentive spirometer in an upright position. 3. Breathe out normally. 4. Place the mouthpiece in your mouth and seal your lips tightly around it. 5. Breathe in slowly and as deeply as possible, raising the piston or the ball toward the top of the column. 6. Hold your breath for 3-5 seconds or for as long as possible. Allow the piston or ball to fall to the bottom of the column. 7. Remove the mouthpiece from your mouth and breathe out normally. 8. Rest for a few seconds and repeat Steps 1 through 7 at least 10 times every 1-2 hours when you are awake. Take your  time and take a few normal breaths between deep breaths. 9. The spirometer may include an indicator to show your best effort. Use the indicator as a goal to work toward during each repetition. 10. After each set of 10 deep breaths, practice coughing to be sure your lungs are clear. If you have an incision (the cut made at the time of  surgery), support your incision when coughing by placing a pillow or rolled up towels firmly against it. Once you are able to get out of bed, walk around indoors and cough well. You may stop using the incentive spirometer when instructed by your caregiver.  RISKS AND COMPLICATIONS  Take your time so you do not get dizzy or light-headed.  If you are in pain, you may need to take or ask for pain medication before doing incentive spirometry. It is harder to take a deep breath if you are having pain. AFTER USE  Rest and breathe slowly and easily.  It can be helpful to keep track of a log of your progress. Your caregiver can provide you with a simple table to help with this. If you are using the spirometer at home, follow these instructions: [place] IF:   You are having difficultly using the spirometer.  You have trouble using the spirometer as often as instructed.  Your pain medication is not giving enough relief while using the spirometer.  You develop fever of 100.5 F (38.1 C) or higher. SEEK IMMEDIATE MEDICAL CARE IF:   You cough up bloody sputum that had not been present before.  You develop fever of 102 F (38.9 C) or greater.  You develop worsening pain at or near the incision site. MAKE SURE YOU:   Understand these instructions.  Will watch your condition.  Will get help right away if you are not doing well or get worse. Document Released: 02/25/2007 Document Revised: 01/07/2012 Document Reviewed: 04/28/2007 Exit[employer] Patient Information 2014 [employer],  [place].   ________________________________________________________________________  WHAT IS A BLOOD TRANSFUSION? Blood Transfusion Information  A transfusion is the replacement of blood or some of its parts. Blood is made up of multiple cells which provide different functions.  Red blood cells carry oxygen and are used for blood loss replacement.  White blood cells fight against infection.  Platelets control bleeding.  Plasma helps clot blood.  Other blood products are available for specialized needs, such as hemophilia or other clotting disorders. BEFORE THE TRANSFUSION  Who gives blood for transfusions?   Healthy volunteers who are fully evaluated to make sure their blood is safe. This is blood bank blood. Transfusion therapy is the safest it has ever been in the practice of medicine. Before blood is taken from a donor, a complete history is taken to make sure that person has no history of diseases nor engages in risky social behavior (examples are intravenous drug use or sexual activity with multiple partners). The donor's travel history is screened to minimize risk of transmitting infections, such as malaria. The donated blood is tested for signs of infectious diseases, such as HIV and hepatitis. The blood is then tested to be sure it is compatible with you in order to minimize the chance of a transfusion reaction. If you or a relative donates blood, this is often done in anticipation of surgery and is not appropriate for emergency situations. It takes many days to process the donated blood. RISKS AND COMPLICATIONS Although transfusion therapy is very safe and saves many lives, the main dangers of transfusion include:   Getting an infectious disease.  Developing a transfusion reaction. This is an allergic reaction to something in the blood you were given. Every precaution is taken to prevent this. The decision to have a blood transfusion has been considered carefully by your caregiver  before blood is given. Blood is not given unless the benefits outweigh the risks. AFTER THE  TRANSFUSION  Right after receiving a blood transfusion, you will usually feel much better and more energetic. This is especially true if your red blood cells have gotten low (anemic). The transfusion raises the level of the red blood cells which carry oxygen, and this usually causes an energy increase.  The nurse administering the transfusion will monitor you carefully for complications. HOME CARE INSTRUCTIONS  No special instructions are needed after a transfusion. You may find your energy is better. Speak with your caregiver about any limitations on activity for underlying diseases you may have. SEEK MEDICAL CARE IF:   Your condition is not improving after your transfusion.  You develop redness or irritation at the intravenous (IV) site. SEEK IMMEDIATE MEDICAL CARE IF:  Any of the following symptoms occur over the next 12 hours:  Shaking chills.  You have a temperature by mouth above 102 F (38.9 C), not controlled by medicine.  Chest, back, or muscle pain.  People around you feel you are not acting correctly or are confused.  Shortness of breath or difficulty breathing.  Dizziness and fainting.  You get a rash or develop hives.  You have a decrease in urine output.  Your urine turns a dark color or changes to pink, red, or brown. Any of the following symptoms occur over the next 10 days:  You have a temperature by mouth above 102 F (38.9 C), not controlled by medicine.  Shortness of breath.  Weakness after normal activity.  The white part of the eye turns yellow (jaundice).  You have a decrease in the amount of urine or are urinating less often.  Your urine turns a dark color or changes to pink, red, or brown. Document Released: 10/12/2000 Document Revised: 01/07/2012 Document Reviewed: 05/31/2008 [facility] Patient Information 2014 [place],  [place].  _______________________________________________________________________

## 2014-05-13 ENCOUNTER — Other Ambulatory Visit: Payer: Self-pay | Admitting: Orthopedic Surgery

## 2014-05-23 ENCOUNTER — Other Ambulatory Visit: Payer: Self-pay | Admitting: Orthopedic Surgery

## 2014-05-23 NOTE — Anesthesia Preprocedure Evaluation (Addendum)
Anesthesia Evaluation  Patient identified by MRN, date of birth, ID band Patient awake    Reviewed: Allergy & Precautions, H&P , NPO status , Patient's Chart, lab work & pertinent test results  Airway Mallampati: III TM Distance: >3 FB Neck ROM: Full    Dental  (+) Teeth Intact, Dental Advisory Given   Pulmonary neg pulmonary ROS,  breath sounds clear to auscultation        Cardiovascular hypertension, Pt. on medications and Pt. on home beta blockers Rhythm:Regular Rate:Normal     Neuro/Psych negative neurological ROS  negative psych ROS   GI/Hepatic Neg liver ROS, GERD-  Medicated,  Endo/Other  Morbid obesity  Renal/GU negative Renal ROS  negative genitourinary   Musculoskeletal negative musculoskeletal ROS (+)   Abdominal   Peds  Hematology negative hematology ROS (+)   Anesthesia Other Findings   Reproductive/Obstetrics                          Anesthesia Physical Anesthesia Plan  ASA: III  Anesthesia Plan: Spinal   Post-op Pain Management:    Induction: Intravenous  Airway Management Planned: Simple Face Mask  Additional Equipment:   Intra-op Plan:   Post-operative Plan: Extubation in OR  Informed Consent: I have reviewed the patients History and Physical, chart, labs and discussed the procedure including the risks, benefits and alternatives for the proposed anesthesia with the patient or authorized representative who has indicated his/her understanding and acceptance.   Dental advisory given  Plan Discussed with: CRNA  Anesthesia Plan Comments:         Anesthesia Quick Evaluation

## 2014-05-23 NOTE — H&P (Signed)
Morgan Frye DOB: Jul 24, 1951 Married / Language: English / Race: White Female  Date of Admission:  05/24/2014  Chief Complaint:  Right knee pain  History of Present Illness The patient is a 63 year old female who comes in for a preoperative History and Physical. The patient is scheduled for a right total knee arthroplasty to be performed by Dr. Dione Plover. Aluisio, MD at [facility] on 05-24-2014. The patient is a 63 year old female who presents with knee complaints. The patient is seen for a second opinion (for right greater than left knee pain). The patient reports left knee and right knee symptoms including: pain which began year(s) ago without any known injury. Prior to being seen today the patient was previously evaluated by a colleague (Dr. Telford Nab) year(s) ago. Previous work-up for this problem has included knee x-rays. Past treatment for this problem has included intra-articular injection of corticosteroids (last one done 5-6 years ago). Management changes made at the last visit include adding Etodolac. Morgan Frye comes in today for evaluation for both of her knees. The right knee seems to be more problematic than the left. The right knee originally started up about nine years ago and was seen by Dr. Telford Nab and received a cortisone injection which relieved her pain. it occurred again about 4 years later and received the same treatment.  She gives a history of both of her parents having rheumatoid arhtrits and knew that she would "just have to live with it" meaing stiff and painful joints. She was placed on etodolac and has been on it for 6-7 years now. She gets blood work every six months thru her PCP.  The left knee tends to be more stiff but the right knee has started to have pain at time. There is some popping when she gets up and the left knee witll buckle at times. She has been favoring off the right knee but denies any swelling or licking of the knee. She  states that there has been some loss of motion with her knees and that she cann only go up and down steps one at a time. Sideways movement is more difficult now also.  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 The knees have become progressively worse over time. The right knee hurts more than the left. It is definitely limiting what she can and cannot do. She has had injections in the past without any long lasting benefit. She said her function has become so bad that she is at a stage where she feels like she needs to get the knee fixed. The left knee historically has not given her much trouble until recently when she has had progressively worsening problems with that, but her right knee is far worse at this time. They have been treated conservatively in the past for the above stated problem and despite conservative measures, they continue to have progressive pain and severe functional limitations and dysfunction. They have failed non-operative management including home exercise, medications. It is felt that they would benefit from undergoing total joint replacement. Risks and benefits of the procedure have been discussed with the patient and they elect to proceed with surgery. There are no active contraindications to surgery such as ongoing infection or rapidly progressive neurological disease.   Allergies Penicillins. Hives, Swelling. Childhood RXN  Problem List/Past Medical Primary osteoarthritis of both knees (715.16  M17.0) High blood pressure Hypercholesterolemia  Arrhythmia Gastroesophageal Reflux Disease Osteoarthritis Kidney Stone Menopause Measles Rubella Scarlet Fever  Family History Cancer. Father. Cerebrovascular Accident. Paternal Grandmother. Hypertension. Father, Mother, Paternal Grandmother. Liver Disease, Chronic. Maternal Grandmother, Mother. Osteoarthritis. Mother. Rheumatoid Arthritis.  Father.  Social History Living situation. live with spouse Marital status. married Most recent primary occupation. Realtor Exercise. Exercises rarely; does other Children. 1 Current drinker. 11/11/2013: Currently drinks wine only occasionally per week Current work status. working full time Tobacco use. Never smoker. 11/11/2013 Tobacco / smoke exposure. 11/11/2013: no No history of drug/alcohol rehab Not under pain contract Number of flights of stairs before winded. 2-3 Post-Surgical Plans. Insurance underwriter. Living Will, Healthcare POA  Medication History Lisinopril-Hydrochlorothiazide (20-12.5MG  Tablet, Oral) Active. Etodolac ER (400MG  Tablet ER 24HR, Oral) Active. Toprol XL (50MG  Tablet ER, Oral) Active. Lipitor (20MG  Tablet, Oral) Active. Glucosamine Chondroitin Complx ( Oral) Active. Multivitamin ( Oral) Active. Calcium Carbonate (600MG  Tablet, Oral) Active. Omega 3 ( Oral) Specific dose unknown - Active. Vitamin D (400UNIT Tablet, Oral) Active. Aspirin EC (325MG  Tablet DR, Oral) Active.  Past Surgical History Cataract Surgery. bilateral Dilation and Curettage of Uterus Tonsillectomy  Review of Systems General:Not Present- Chills, Fever, Night Sweats, Fatigue, Weight Gain, Weight Loss and Memory Loss. Skin:Not Present- Hives, Itching, Rash, Eczema and Lesions. HEENT:Not Present- Tinnitus, Headache, Double Vision, Visual Loss, Hearing Loss and Dentures. Respiratory:Not Present- Shortness of breath with exertion, Shortness of breath at rest, Allergies, Coughing up blood and Chronic Cough. Cardiovascular:Not Present- Chest Pain, Racing/skipping heartbeats, Difficulty Breathing Lying Down, Murmur, Swelling and Palpitations. Gastrointestinal:Not Present- Bloody Stool, Heartburn, Abdominal Pain, Vomiting, Nausea, Constipation, Diarrhea, Difficulty Swallowing, Jaundice and Loss of appetitie. Female Genitourinary:Not Present- Blood in Urine,  Urinary frequency, Weak urinary stream, Discharge, Flank Pain, Incontinence, Painful Urination, Urgency, Urinary Retention and Urinating at Night. Musculoskeletal:Present- Joint Pain and Morning Stiffness. Not Present- Muscle Weakness, Muscle Pain, Joint Swelling, Back Pain and Spasms. Neurological:Not Present- Tremor, Dizziness, Blackout spells, Paralysis, Difficulty with balance and Weakness. Psychiatric:Not Present- Insomnia.    Vitals BP: 134/88 (Sitting, Right Arm, Standard)   Physical Exam The physical exam findings are as follows:   General Mental Status - Alert, cooperative and good historian. General Appearance- pleasant. Not in acute distress. Orientation- Oriented X3. Build & Nutrition- Well nourished and Well developed.   Head and Neck Head- normocephalic, atraumatic . Neck Global Assessment- supple. no bruit auscultated on the right and no bruit auscultated on the left.   Eye Vision- Wears corrective lenses. Pupil- Bilateral- Regular and Round. Motion- Bilateral- EOMI.   Chest and Lung Exam Auscultation: Breath sounds:- clear at anterior chest wall and - clear at posterior chest wall. Adventitious sounds:- No Adventitious sounds.   Cardiovascular Auscultation:Rhythm- Regular rate and rhythm. Heart Sounds- S1 WNL and S2 WNL. Murmurs & Other Heart Sounds:Auscultation of the heart reveals - No Murmurs.   Abdomen Inspection:Contour- Generalized mild distention. Palpation/Percussion:Tenderness- Abdomen is non-tender to palpation. Rigidity (guarding)- Abdomen is soft. Auscultation:Auscultation of the abdomen reveals - Bowel sounds normal.   Female Genitourinary Not done, not pertinent to present illness  Musculoskeletal Her left knee shows no effusion. Range is about 5-115 degrees. There is slight tenderness medial. No lateral tenderness or instability.  Right knee with no effusion. Range about 10-100 degrees with  moderate crepitus on range of motion. Tenderness medial greater than lateral with no instability. Pulses, sensation, and motor intact in both lower extremities  RADIOGRAPHS AP of both knees and lateral show that she has advanced end stage arthritis of both knees,  medial and patellofemoral bone on bone, worse on the right than the left.   Assessment & Plan Primary osteoarthritis of both knees (715.16  M17.0) Impression: Right Knee  Note: Plan is for a Right Total Knee Replacement by Dr. Wynelle Link.  Plan is to go to [facility].  PCP - Dr. Maurice Small - Patient has been seen preoperatively and felt to be stable for surgery.  The patient does not have any contraindications and will receive TXA (tranexamic acid) prior to surgery.  Signed electronically by Ok Edwards, III PA-C

## 2014-05-24 ENCOUNTER — Inpatient Hospital Stay (HOSPITAL_COMMUNITY)
Admission: RE | Admit: 2014-05-24 | Discharge: 2014-05-26 | DRG: 470 | Disposition: A | Discharge status: Skilled Nursing Facility | Payer: BC Managed Care – PPO | Source: Ambulatory Visit | Attending: Orthopedic Surgery | Admitting: Orthopedic Surgery

## 2014-05-24 ENCOUNTER — Encounter (HOSPITAL_COMMUNITY): Payer: Self-pay | Admitting: *Deleted

## 2014-05-24 ENCOUNTER — Encounter (HOSPITAL_COMMUNITY): Payer: BC Managed Care – PPO | Admitting: Anesthesiology

## 2014-05-24 ENCOUNTER — Inpatient Hospital Stay (HOSPITAL_COMMUNITY): Payer: BC Managed Care – PPO | Admitting: Anesthesiology

## 2014-05-24 ENCOUNTER — Encounter (HOSPITAL_COMMUNITY)
Admission: RE | Disposition: A | Discharge status: Skilled Nursing Facility | Payer: Self-pay | Source: Ambulatory Visit | Attending: Orthopedic Surgery

## 2014-05-24 DIAGNOSIS — Z6841 Body Mass Index (BMI) 40.0 and over, adult: Secondary | ICD-10-CM

## 2014-05-24 DIAGNOSIS — K219 Gastro-esophageal reflux disease without esophagitis: Secondary | ICD-10-CM | POA: Diagnosis present

## 2014-05-24 DIAGNOSIS — Z96651 Presence of right artificial knee joint: Secondary | ICD-10-CM

## 2014-05-24 DIAGNOSIS — E871 Hypo-osmolality and hyponatremia: Secondary | ICD-10-CM | POA: Diagnosis not present

## 2014-05-24 DIAGNOSIS — D62 Acute posthemorrhagic anemia: Secondary | ICD-10-CM | POA: Diagnosis not present

## 2014-05-24 DIAGNOSIS — I10 Essential (primary) hypertension: Secondary | ICD-10-CM | POA: Diagnosis present

## 2014-05-24 DIAGNOSIS — M179 Osteoarthritis of knee, unspecified: Secondary | ICD-10-CM | POA: Diagnosis present

## 2014-05-24 DIAGNOSIS — M1711 Unilateral primary osteoarthritis, right knee: Secondary | ICD-10-CM

## 2014-05-24 DIAGNOSIS — M1710 Unilateral primary osteoarthritis, unspecified knee: Principal | ICD-10-CM | POA: Diagnosis present

## 2014-05-24 DIAGNOSIS — Z823 Family history of stroke: Secondary | ICD-10-CM

## 2014-05-24 HISTORY — PX: TOTAL KNEE ARTHROPLASTY: SHX125

## 2014-05-24 LAB — ABO/RH: ABO/RH(D): A POS

## 2014-05-24 LAB — TYPE AND SCREEN
ABO/RH(D): A POS
ANTIBODY SCREEN: NEGATIVE

## 2014-05-24 SURGERY — ARTHROPLASTY, KNEE, TOTAL
Anesthesia: Spinal | Site: Knee | Laterality: Right

## 2014-05-24 MED ORDER — EPHEDRINE SULFATE 50 MG/ML IJ SOLN
INTRAMUSCULAR | Status: AC
  Filled 2014-05-24: qty 1

## 2014-05-24 MED ORDER — FENTANYL CITRATE 0.05 MG/ML IJ SOLN
INTRAMUSCULAR | Status: DC | PRN
  Administered 2014-05-24: 100 ug via INTRAVENOUS

## 2014-05-24 MED ORDER — PHENYLEPHRINE 40 MCG/ML (10ML) SYRINGE FOR IV PUSH (FOR BLOOD PRESSURE SUPPORT)
PREFILLED_SYRINGE | INTRAVENOUS | Status: AC
  Filled 2014-05-24: qty 10

## 2014-05-24 MED ORDER — ATROPINE SULFATE 0.4 MG/ML IJ SOLN
INTRAMUSCULAR | Status: AC
  Filled 2014-05-24: qty 1

## 2014-05-24 MED ORDER — ROCURONIUM BROMIDE 100 MG/10ML IV SOLN
INTRAVENOUS | Status: AC
  Filled 2014-05-24: qty 1

## 2014-05-24 MED ORDER — SODIUM CHLORIDE 0.9 % IV SOLN: INTRAVENOUS | Status: DC

## 2014-05-24 MED ORDER — KETOROLAC TROMETHAMINE 15 MG/ML IJ SOLN
INTRAMUSCULAR | Status: AC
  Filled 2014-05-24: qty 1

## 2014-05-24 MED ORDER — FLEET ENEMA 7-19 GM/118ML RE ENEM: 1.0000 | ENEMA | Freq: Once | RECTAL | Status: AC | PRN

## 2014-05-24 MED ORDER — SODIUM CHLORIDE 0.9 % IJ SOLN
INTRAMUSCULAR | Status: AC
  Filled 2014-05-24: qty 10

## 2014-05-24 MED ORDER — HYDROMORPHONE HCL PF 1 MG/ML IJ SOLN: 0.2500 mg | INTRAMUSCULAR | Status: DC | PRN

## 2014-05-24 MED ORDER — LACTATED RINGERS IV SOLN
INTRAVENOUS | Status: DC | PRN
  Administered 2014-05-24 (×2): via INTRAVENOUS

## 2014-05-24 MED ORDER — METOCLOPRAMIDE HCL 10 MG PO TABS: 5.0000 mg | ORAL_TABLET | Freq: Three times a day (TID) | ORAL | Status: DC | PRN

## 2014-05-24 MED ORDER — BUPIVACAINE IN DEXTROSE 0.75-8.25 % IT SOLN
INTRATHECAL | Status: DC | PRN
  Administered 2014-05-24: 2 mL via INTRATHECAL

## 2014-05-24 MED ORDER — RIVAROXABAN 10 MG PO TABS
10.0000 mg | ORAL_TABLET | Freq: Every day | ORAL | Status: DC
  Administered 2014-05-25 – 2014-05-26 (×2): 10 mg via ORAL
  Filled 2014-05-24 (×3): qty 1

## 2014-05-24 MED ORDER — POTASSIUM CHLORIDE IN NACL 20-0.45 MEQ/L-% IV SOLN
INTRAVENOUS | Status: DC
  Administered 2014-05-24 – 2014-05-25 (×2): via INTRAVENOUS
  Filled 2014-05-24 (×3): qty 1000

## 2014-05-24 MED ORDER — PROPOFOL 10 MG/ML IV BOLUS
INTRAVENOUS | Status: AC
  Filled 2014-05-24: qty 20

## 2014-05-24 MED ORDER — PROMETHAZINE HCL 25 MG/ML IJ SOLN: 6.2500 mg | INTRAMUSCULAR | Status: DC | PRN

## 2014-05-24 MED ORDER — ACETAMINOPHEN 500 MG PO TABS
1000.0000 mg | ORAL_TABLET | Freq: Four times a day (QID) | ORAL | Status: AC
  Administered 2014-05-24 – 2014-05-25 (×4): 1000 mg via ORAL
  Filled 2014-05-24 (×3): qty 2

## 2014-05-24 MED ORDER — ONDANSETRON HCL 4 MG PO TABS: 4.0000 mg | ORAL_TABLET | Freq: Four times a day (QID) | ORAL | Status: DC | PRN

## 2014-05-24 MED ORDER — DEXAMETHASONE SODIUM PHOSPHATE 10 MG/ML IJ SOLN: 10.0000 mg | Freq: Once | INTRAMUSCULAR | Status: DC

## 2014-05-24 MED ORDER — SODIUM CHLORIDE 0.9 % IR SOLN
Status: DC | PRN
  Administered 2014-05-24: 1000 mL

## 2014-05-24 MED ORDER — ACETAMINOPHEN 650 MG RE SUPP: 650.0000 mg | Freq: Four times a day (QID) | RECTAL | Status: DC | PRN

## 2014-05-24 MED ORDER — METHOCARBAMOL 500 MG PO TABS
500.0000 mg | ORAL_TABLET | Freq: Four times a day (QID) | ORAL | Status: DC | PRN
  Administered 2014-05-24 – 2014-05-26 (×4): 500 mg via ORAL
  Filled 2014-05-24 (×7): qty 1

## 2014-05-24 MED ORDER — DEXAMETHASONE 6 MG PO TABS
10.0000 mg | ORAL_TABLET | Freq: Every day | ORAL | Status: AC
  Administered 2014-05-25: 10 mg via ORAL
  Filled 2014-05-24: qty 1

## 2014-05-24 MED ORDER — EPHEDRINE SULFATE 50 MG/ML IJ SOLN
INTRAMUSCULAR | Status: DC | PRN
  Administered 2014-05-24 (×2): 10 mg via INTRAVENOUS

## 2014-05-24 MED ORDER — KETOROLAC TROMETHAMINE 15 MG/ML IJ SOLN
7.5000 mg | Freq: Four times a day (QID) | INTRAMUSCULAR | Status: AC | PRN
  Administered 2014-05-24: 7.5 mg via INTRAVENOUS

## 2014-05-24 MED ORDER — MORPHINE SULFATE 2 MG/ML IJ SOLN: 1.0000 mg | INTRAMUSCULAR | Status: DC | PRN

## 2014-05-24 MED ORDER — METHOCARBAMOL 1000 MG/10ML IJ SOLN
500.0000 mg | Freq: Four times a day (QID) | INTRAVENOUS | Status: DC | PRN
  Administered 2014-05-24: 500 mg via INTRAVENOUS
  Filled 2014-05-24: qty 5

## 2014-05-24 MED ORDER — DIPHENHYDRAMINE HCL 12.5 MG/5ML PO ELIX: 12.5000 mg | ORAL_SOLUTION | ORAL | Status: DC | PRN

## 2014-05-24 MED ORDER — LACTATED RINGERS IV SOLN: INTRAVENOUS | Status: DC

## 2014-05-24 MED ORDER — SODIUM CHLORIDE 0.9 % IJ SOLN
INTRAMUSCULAR | Status: AC
  Filled 2014-05-24: qty 50

## 2014-05-24 MED ORDER — DEXAMETHASONE SODIUM PHOSPHATE 10 MG/ML IJ SOLN
INTRAMUSCULAR | Status: AC
  Filled 2014-05-24: qty 1

## 2014-05-24 MED ORDER — MIDAZOLAM HCL 5 MG/5ML IJ SOLN
INTRAMUSCULAR | Status: DC | PRN
  Administered 2014-05-24: 2 mg via INTRAVENOUS
  Administered 2014-05-24 (×2): 1 mg via INTRAVENOUS

## 2014-05-24 MED ORDER — PROPOFOL INFUSION 10 MG/ML OPTIME
INTRAVENOUS | Status: DC | PRN
  Administered 2014-05-24: 100 ug/kg/min via INTRAVENOUS

## 2014-05-24 MED ORDER — MIDAZOLAM HCL 2 MG/2ML IJ SOLN
INTRAMUSCULAR | Status: AC
  Filled 2014-05-24: qty 2

## 2014-05-24 MED ORDER — POLYETHYLENE GLYCOL 3350 17 G PO PACK
17.0000 g | PACK | Freq: Every day | ORAL | Status: DC | PRN
  Administered 2014-05-26: 17 g via ORAL

## 2014-05-24 MED ORDER — METOCLOPRAMIDE HCL 5 MG/ML IJ SOLN: 5.0000 mg | Freq: Three times a day (TID) | INTRAMUSCULAR | Status: DC | PRN

## 2014-05-24 MED ORDER — BUPIVACAINE LIPOSOME 1.3 % IJ SUSP: 20.0000 mL | Freq: Once | INTRAMUSCULAR | Status: DC

## 2014-05-24 MED ORDER — PROPOFOL 10 MG/ML IV BOLUS
INTRAVENOUS | Status: DC | PRN
  Administered 2014-05-24 (×2): 20 mg via INTRAVENOUS

## 2014-05-24 MED ORDER — TRAMADOL HCL 50 MG PO TABS
50.0000 mg | ORAL_TABLET | Freq: Four times a day (QID) | ORAL | Status: DC | PRN
  Administered 2014-05-25 – 2014-05-26 (×2): 100 mg via ORAL
  Filled 2014-05-24 (×2): qty 2

## 2014-05-24 MED ORDER — SODIUM CHLORIDE 0.9 % IV SOLN
1500.0000 mg | INTRAVENOUS | Status: AC
  Administered 2014-05-24: 1500 mg via INTRAVENOUS
  Filled 2014-05-24: qty 1500

## 2014-05-24 MED ORDER — OXYCODONE HCL 5 MG PO TABS
5.0000 mg | ORAL_TABLET | ORAL | Status: DC | PRN
  Administered 2014-05-24 – 2014-05-25 (×5): 5 mg via ORAL
  Administered 2014-05-25 (×2): 10 mg via ORAL
  Administered 2014-05-25 – 2014-05-26 (×3): 5 mg via ORAL
  Filled 2014-05-24 (×2): qty 2
  Filled 2014-05-24: qty 1
  Filled 2014-05-24: qty 2
  Filled 2014-05-24 (×6): qty 1
  Filled 2014-05-24 (×2): qty 2

## 2014-05-24 MED ORDER — BUPIVACAINE LIPOSOME 1.3 % IJ SUSP
INTRAMUSCULAR | Status: DC | PRN
  Administered 2014-05-24: 20 mL

## 2014-05-24 MED ORDER — MENTHOL 3 MG MT LOZG
1.0000 | LOZENGE | OROMUCOSAL | Status: DC | PRN
  Filled 2014-05-24: qty 9

## 2014-05-24 MED ORDER — BUPIVACAINE LIPOSOME 1.3 % IJ SUSP
20.0000 mL | Freq: Once | INTRAMUSCULAR | Status: DC
  Filled 2014-05-24: qty 20

## 2014-05-24 MED ORDER — CEFAZOLIN SODIUM-DEXTROSE 2-3 GM-% IV SOLR: 2.0000 g | INTRAVENOUS | Status: DC

## 2014-05-24 MED ORDER — DOCUSATE SODIUM 100 MG PO CAPS
100.0000 mg | ORAL_CAPSULE | Freq: Two times a day (BID) | ORAL | Status: DC
  Administered 2014-05-24 – 2014-05-26 (×4): 100 mg via ORAL

## 2014-05-24 MED ORDER — ONDANSETRON HCL 4 MG/2ML IJ SOLN: 4.0000 mg | Freq: Four times a day (QID) | INTRAMUSCULAR | Status: DC | PRN

## 2014-05-24 MED ORDER — CHLORHEXIDINE GLUCONATE 4 % EX LIQD: 60.0000 mL | Freq: Once | CUTANEOUS | Status: DC

## 2014-05-24 MED ORDER — DEXAMETHASONE SODIUM PHOSPHATE 10 MG/ML IJ SOLN
10.0000 mg | Freq: Once | INTRAMUSCULAR | Status: AC
  Administered 2014-05-24: 10 mg via INTRAVENOUS

## 2014-05-24 MED ORDER — ATORVASTATIN CALCIUM 20 MG PO TABS
20.0000 mg | ORAL_TABLET | Freq: Every day | ORAL | Status: DC
  Administered 2014-05-24 – 2014-05-25 (×2): 20 mg via ORAL
  Filled 2014-05-24 (×3): qty 1

## 2014-05-24 MED ORDER — LIDOCAINE HCL (CARDIAC) 20 MG/ML IV SOLN
INTRAVENOUS | Status: AC
  Filled 2014-05-24: qty 5

## 2014-05-24 MED ORDER — BUPIVACAINE HCL 0.25 % IJ SOLN
INTRAMUSCULAR | Status: DC | PRN
  Administered 2014-05-24: 30 mL

## 2014-05-24 MED ORDER — ACETAMINOPHEN 325 MG PO TABS
650.0000 mg | ORAL_TABLET | Freq: Four times a day (QID) | ORAL | Status: DC | PRN
  Filled 2014-05-24: qty 2

## 2014-05-24 MED ORDER — PHENOL 1.4 % MT LIQD: 1.0000 | OROMUCOSAL | Status: DC | PRN

## 2014-05-24 MED ORDER — BISACODYL 10 MG RE SUPP: 10.0000 mg | Freq: Every day | RECTAL | Status: DC | PRN

## 2014-05-24 MED ORDER — ACETAMINOPHEN 10 MG/ML IV SOLN
1000.0000 mg | Freq: Once | INTRAVENOUS | Status: AC
  Administered 2014-05-24: 1000 mg via INTRAVENOUS
  Filled 2014-05-24: qty 100

## 2014-05-24 MED ORDER — PHENYLEPHRINE HCL 10 MG/ML IJ SOLN
INTRAMUSCULAR | Status: DC | PRN
  Administered 2014-05-24 (×9): 80 ug via INTRAVENOUS

## 2014-05-24 MED ORDER — DEXAMETHASONE SODIUM PHOSPHATE 10 MG/ML IJ SOLN
10.0000 mg | Freq: Every day | INTRAMUSCULAR | Status: AC
  Filled 2014-05-24: qty 1

## 2014-05-24 MED ORDER — ACETAMINOPHEN 10 MG/ML IV SOLN: 1000.0000 mg | Freq: Once | INTRAVENOUS | Status: DC

## 2014-05-24 MED ORDER — VANCOMYCIN HCL IN DEXTROSE 1-5 GM/200ML-% IV SOLN
1000.0000 mg | Freq: Two times a day (BID) | INTRAVENOUS | Status: AC
  Administered 2014-05-24: 1000 mg via INTRAVENOUS
  Filled 2014-05-24: qty 200

## 2014-05-24 MED ORDER — METOPROLOL SUCCINATE ER 50 MG PO TB24
50.0000 mg | ORAL_TABLET | Freq: Every day | ORAL | Status: DC
  Administered 2014-05-24 – 2014-05-25 (×2): 50 mg via ORAL
  Filled 2014-05-24 (×3): qty 1

## 2014-05-24 MED ORDER — FENTANYL CITRATE 0.05 MG/ML IJ SOLN
INTRAMUSCULAR | Status: AC
  Filled 2014-05-24: qty 2

## 2014-05-24 MED ORDER — BUPIVACAINE-EPINEPHRINE (PF) 0.25% -1:200000 IJ SOLN
INTRAMUSCULAR | Status: AC
  Filled 2014-05-24: qty 30

## 2014-05-24 MED ORDER — SODIUM CHLORIDE 0.9 % IJ SOLN
INTRAMUSCULAR | Status: DC | PRN
  Administered 2014-05-24: 30 mL

## 2014-05-24 MED ORDER — TRANEXAMIC ACID 100 MG/ML IV SOLN
1000.0000 mg | INTRAVENOUS | Status: AC
  Administered 2014-05-24: 1000 mg via INTRAVENOUS
  Filled 2014-05-24: qty 10

## 2014-05-24 SURGICAL SUPPLY — 58 items
BAG ZIPLOCK 12X15 (MISCELLANEOUS) ×3 IMPLANT
BANDAGE ELASTIC 6 VELCRO ST LF (GAUZE/BANDAGES/DRESSINGS) ×3 IMPLANT
BANDAGE ESMARK 6X9 LF (GAUZE/BANDAGES/DRESSINGS) ×1 IMPLANT
BLADE SAG 18X100X1.27 (BLADE) ×3 IMPLANT
BLADE SAW SGTL 11.0X1.19X90.0M (BLADE) ×3 IMPLANT
BNDG ESMARK 6X9 LF (GAUZE/BANDAGES/DRESSINGS) ×3
BOWL SMART MIX CTS (DISPOSABLE) ×3 IMPLANT
CAP KNEE ATTUNE RP ×3 IMPLANT
CEMENT HV SMART SET (Cement) ×6 IMPLANT
CLOSURE WOUND 1/2 X4 (GAUZE/BANDAGES/DRESSINGS) ×1
CUFF TOURN SGL QUICK 34 (TOURNIQUET CUFF) ×2
CUFF TRNQT CYL 34X4X40X1 (TOURNIQUET CUFF) ×1 IMPLANT
DECANTER SPIKE VIAL GLASS SM (MISCELLANEOUS) ×3 IMPLANT
DRAPE EXTREMITY T 121X128X90 (DRAPE) ×3 IMPLANT
DRAPE POUCH INSTRU U-SHP 10X18 (DRAPES) ×3 IMPLANT
DRAPE U-SHAPE 47X51 STRL (DRAPES) ×3 IMPLANT
DRSG ADAPTIC 3X8 NADH LF (GAUZE/BANDAGES/DRESSINGS) ×3 IMPLANT
DRSG PAD ABDOMINAL 8X10 ST (GAUZE/BANDAGES/DRESSINGS) IMPLANT
DURAPREP 26ML APPLICATOR (WOUND CARE) ×3 IMPLANT
ELECT REM PT RETURN 9FT ADLT (ELECTROSURGICAL) ×3
ELECTRODE REM PT RTRN 9FT ADLT (ELECTROSURGICAL) ×1 IMPLANT
EVACUATOR 1/8 PVC DRAIN (DRAIN) ×3 IMPLANT
FACESHIELD WRAPAROUND (MASK) ×15 IMPLANT
GAUZE SPONGE 4X4 12PLY STRL (GAUZE/BANDAGES/DRESSINGS) IMPLANT
GLOVE BIO SURGEON STRL SZ7.5 (GLOVE) IMPLANT
GLOVE BIO SURGEON STRL SZ8 (GLOVE) ×3 IMPLANT
GLOVE BIOGEL PI IND STRL 6.5 (GLOVE) IMPLANT
GLOVE BIOGEL PI IND STRL 8 (GLOVE) ×1 IMPLANT
GLOVE BIOGEL PI INDICATOR 6.5 (GLOVE)
GLOVE BIOGEL PI INDICATOR 8 (GLOVE) ×2
GLOVE SURG SS PI 6.5 STRL IVOR (GLOVE) IMPLANT
GOWN STRL REUS W/TWL LRG LVL3 (GOWN DISPOSABLE) ×3 IMPLANT
GOWN STRL REUS W/TWL XL LVL3 (GOWN DISPOSABLE) IMPLANT
HANDPIECE INTERPULSE COAX TIP (DISPOSABLE) ×2
IMMOBILIZER KNEE 20 (SOFTGOODS) ×3 IMPLANT
IMMOBILIZER KNEE 20 THIGH 36 (SOFTGOODS) IMPLANT
KIT BASIN OR (CUSTOM PROCEDURE TRAY) ×3 IMPLANT
MANIFOLD NEPTUNE II (INSTRUMENTS) ×3 IMPLANT
NDL SAFETY ECLIPSE 18X1.5 (NEEDLE) ×2 IMPLANT
NEEDLE HYPO 18GX1.5 SHARP (NEEDLE) ×4
NS IRRIG 1000ML POUR BTL (IV SOLUTION) ×3 IMPLANT
PACK TOTAL JOINT (CUSTOM PROCEDURE TRAY) ×3 IMPLANT
PADDING CAST COTTON 6X4 STRL (CAST SUPPLIES) ×3 IMPLANT
POSITIONER SURGICAL ARM (MISCELLANEOUS) ×3 IMPLANT
SET HNDPC FAN SPRY TIP SCT (DISPOSABLE) ×1 IMPLANT
STRIP CLOSURE SKIN 1/2X4 (GAUZE/BANDAGES/DRESSINGS) ×2 IMPLANT
SUCTION FRAZIER 12FR DISP (SUCTIONS) ×3 IMPLANT
SUT MNCRL AB 4-0 PS2 18 (SUTURE) ×3 IMPLANT
SUT VIC AB 2-0 CT1 27 (SUTURE) ×6
SUT VIC AB 2-0 CT1 TAPERPNT 27 (SUTURE) ×3 IMPLANT
SUT VLOC 180 0 24IN GS25 (SUTURE) ×3 IMPLANT
SYRINGE 20CC LL (MISCELLANEOUS) ×3 IMPLANT
SYRINGE 60CC LL (MISCELLANEOUS) ×3 IMPLANT
TOWEL OR 17X26 10 PK STRL BLUE (TOWEL DISPOSABLE) ×3 IMPLANT
TOWEL OR NON WOVEN STRL DISP B (DISPOSABLE) IMPLANT
TRAY FOLEY CATH 14FRSI W/METER (CATHETERS) ×3 IMPLANT
WATER STERILE IRR 1500ML POUR (IV SOLUTION) ×3 IMPLANT
WRAP KNEE MAXI GEL POST OP (GAUZE/BANDAGES/DRESSINGS) ×3 IMPLANT

## 2014-05-24 NOTE — Progress Notes (Signed)
Clinical Social Work Department CLINICAL SOCIAL WORK PLACEMENT NOTE 05/24/2014  Patient:  Morgan Frye, Morgan Frye  Account Number:  [id number] Admit date:  05/24/2014  Clinical Social Worker:  Werner Lean, LCSW  Date/time:  05/24/2014 12:01 PM  Clinical Social Work is seeking post-discharge placement for this patient at the following level of care:   SKILLED NURSING   (*CSW will update this form in Epic as items are completed)     Patient/family provided with [place] Department of Clinical Social Work's list of facilities offering this level of care within the geographic area requested by the patient (or if unable, by the patient's family).  05/24/2014  Patient/family informed of their freedom to choose among providers that offer the needed level of care, that participate in Medicare, Medicaid or managed care program needed by the patient, have an available bed and are willing to accept the patient.    Patient/family informed of [facility]' ownership interest in [facility], as well as of the fact that they are under no obligation to receive care at this facility.  PASARR submitted to EDS on 05/24/2014 PASARR number received on 05/24/2014  FL2 transmitted to all facilities in geographic area requested by pt/family on  05/24/2014 FL2 transmitted to all facilities within larger geographic area on   Patient informed that his/her managed care company has contracts with or will negotiate with  certain facilities, including the following:     Patient/family informed of bed offers received:  05/24/2014 Patient chooses bed at [place] Physician recommends and patient chooses bed at    Patient to be transferred to  on   Patient to be transferred to facility by  Patient and family notified of transfer on  Name of family member notified:    The following physician request were entered in Epic:   Additional Comments:  Werner Lean LCSW [phone]

## 2014-05-24 NOTE — Interval H&P Note (Signed)
History and Physical Interval Note:  05/24/2014 6:36 AM  Morgan Frye  has presented today for surgery, with the diagnosis of OSTEOARTHRITIS  RIGHT KNEE  The various methods of treatment have been discussed with the patient and family. After consideration of risks, benefits and other options for treatment, the patient has consented to  Procedure(s): RIGHT TOTAL KNEE ARTHROPLASTY (Right) as a surgical intervention .  The patient's history has been reviewed, patient examined, no change in status, stable for surgery.  I have reviewed the patient's chart and labs.  Questions were answered to the patient's satisfaction.     Gearlean Alf

## 2014-05-24 NOTE — Anesthesia Procedure Notes (Signed)
Spinal  Patient location during procedure: OR Start time: 05/24/2014 7:15 AM End time: 05/24/2014 7:25 AM Staffing Anesthesiologist: FORTUNE, ALEXANDER F Performed by: anesthesiologist  Preanesthetic Checklist Completed: patient identified, site marked, surgical consent, pre-op evaluation, timeout performed, IV checked, risks and benefits discussed and monitors and equipment checked Spinal Block Patient position: sitting Prep: Betadine Patient monitoring: heart rate, continuous pulse ox and blood pressure Approach: midline Location: L2-3 Injection technique: single-shot Needle Needle type: Spinocan  Needle gauge: 22 G Needle length: 9 cm Additional Notes Expiration date of kit checked and confirmed. Patient tolerated procedure well, without complications. Negative heme/paresthesia    

## 2014-05-24 NOTE — Transfer of Care (Signed)
Immediate Anesthesia Transfer of Care Note  Patient: Morgan Frye  Procedure(s) Performed: Procedure(s): RIGHT TOTAL KNEE ARTHROPLASTY (Right)  Patient Location: PACU  Anesthesia Type:Spinal  Level of Consciousness: awake, alert , oriented and patient cooperative  Airway & Oxygen Therapy: Patient Spontanous Breathing and Patient connected to face mask oxygen  Post-op Assessment: Report given to PACU RN, Post -op Vital signs reviewed and stable and SAB level T7.  Post vital signs: Reviewed and stable  Complications: No apparent anesthesia complications

## 2014-05-24 NOTE — Plan of Care (Signed)
Problem: Consults Goal: Diagnosis- Total Joint Replacement Primary Total Knee     

## 2014-05-24 NOTE — Anesthesia Postprocedure Evaluation (Signed)
Anesthesia Post Note  Patient: Morgan Frye  Procedure(s) Performed: Procedure(s) (LRB): RIGHT TOTAL KNEE ARTHROPLASTY (Right)  Anesthesia type: Spinal  Patient location: PACU  Post pain: Pain level controlled  Post assessment: Post-op Vital signs reviewed  Last Vitals:  Filed Vitals:   05/24/14 1346  BP: 101/62  Pulse: 85  Temp: 36.3 C  Resp: 16    Post vital signs: Reviewed  Level of consciousness: sedated  Complications: No apparent anesthesia complications

## 2014-05-24 NOTE — Evaluation (Signed)
Physical Therapy Evaluation Patient Details Name: Morgan Frye MRN: [id number] DOB: [birth year] Today's Date: 05/24/2014   History of Present Illness  RTKA  Clinical Impression  Pt will benefit from Post acute rehab at [facility] . Pt will benefit from PT while in acute care to address problems listed    Follow Up Recommendations SNF;Supervision/Assistance - 24 hour    Equipment Recommendations  Rolling walker with 5" wheels    Recommendations for Other Services       Precautions / Restrictions Precautions Precautions: Knee Required Braces or Orthoses: Knee Immobilizer - Right Knee Immobilizer - Right: Discontinue once straight leg raise with < 10 degree lag      Mobility  Bed Mobility Overal bed mobility: Needs Assistance Bed Mobility: Supine to Sit     Supine to sit: Min assist     General bed mobility comments: cues for technique  Transfers Overall transfer level: Needs assistance Equipment used: Rolling walker (2 wheeled) Transfers: Sit to/from Stand           General transfer comment: cues for hand placement, R leg  Ambulation/Gait Ambulation/Gait assistance: Min assist Ambulation Distance (Feet): 10 Feet Assistive device: Rolling walker (2 wheeled) Gait Pattern/deviations: WFL(Within Functional Limits)     General Gait Details:  cues for sequence  Stairs            Wheelchair Mobility    Modified Rankin (Stroke Patients Only)       Balance                                             Pertinent Vitals/Pain reports no pain    Home Living Family/patient expects to be discharged to:: Private residence (plans SNF) Living Arrangements: Spouse/significant other Available Help at Discharge: Family Type of Home: House       Home Layout: Two level Home Equipment: None      Prior Function                 Hand Dominance        Extremity/Trunk Assessment   Upper Extremity Assessment: Overall WFL for tasks  assessed           Lower Extremity Assessment: RLE deficits/detail RLE Deficits / Details: able to perform SLR       Communication      Cognition Arousal/Alertness: Awake/alert Behavior During Therapy: WFL for tasks assessed/performed Overall Cognitive Status: Within Functional Limits for tasks assessed                      General Comments      Exercises Total Joint Exercises Straight Leg Raises: AROM;Right;5 reps      Assessment/Plan    PT Assessment Patient needs continued PT services  PT Diagnosis Difficulty walking;Acute pain   PT Problem List Decreased strength;Decreased range of motion;Decreased activity tolerance;Decreased mobility;Decreased knowledge of use of DME;Decreased knowledge of precautions;Decreased safety awareness;Pain  PT Treatment Interventions DME instruction;Gait training;Functional mobility training;Therapeutic exercise;Therapeutic activities;Patient/family education   PT Goals (Current goals can be found in the Care Plan section) Acute Rehab PT Goals Patient Stated Goal: to go to [facility] PT Goal Formulation: With patient/family Time For Goal Achievement: 05/31/14 Potential to Achieve Goals: Good    Frequency 7X/week   Barriers to discharge Inaccessible home environment      Co-evaluation  End of Session Equipment Utilized During Treatment: Right knee immobilizer Activity Tolerance: Patient tolerated treatment well Patient left: in chair;with call bell/phone within reach;with family/visitor present Nurse Communication: Mobility status         Time: 1638-4665 PT Time Calculation (min): 25 min   Charges:   PT Evaluation $Initial PT Evaluation Tier I: 1 Procedure PT Treatments $Gait Training: 23-37 mins   PT G CodesClaretha Cooper 05/24/2014, 4:27 PM Tresa Endo PT [phone]'

## 2014-05-24 NOTE — H&P (View-Only) (Signed)
Morgan Frye DOB: 1951-03-14 Married / Language: English / Race: White Female  Date of Admission:  05/24/2014  Chief Complaint:  Right knee pain  History of Present Illness The patient is a 63 year old female who comes in for a preoperative History and Physical. The patient is scheduled for a right total knee arthroplasty to be performed by Dr. Dione Plover. Aluisio, MD at [facility] on 05-24-2014. The patient is a 63 year old female who presents with knee complaints. The patient is seen for a second opinion (for right greater than left knee pain). The patient reports left knee and right knee symptoms including: pain which began year(s) ago without any known injury. Prior to being seen today the patient was previously evaluated by a colleague (Dr. Telford Nab) year(s) ago. Previous work-up for this problem has included knee x-rays. Past treatment for this problem has included intra-articular injection of corticosteroids (last one done 5-6 years ago). Management changes made at the last visit include adding Etodolac. Mrs. Morgan Frye comes in today for evaluation for both of her knees. The right knee seems to be more problematic than the left. The right knee originally started up about nine years ago and was seen by Dr. Telford Nab and received a cortisone injection which relieved her pain. it occurred again about 4 years later and received the same treatment.  She gives a history of both of her parents having rheumatoid arhtrits and knew that she would "just have to live with it" meaing stiff and painful joints. She was placed on etodolac and has been on it for 6-7 years now. She gets blood work every six months thru her PCP.  The left knee tends to be more stiff but the right knee has started to have pain at time. There is some popping when she gets up and the left knee witll buckle at times. She has been favoring off the right knee but denies any swelling or licking of the knee. She  states that there has been some loss of motion with her knees and that she cann only go up and down steps one at a time. Sideways movement is more difficult now also.  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 The knees have become progressively worse over time. The right knee hurts more than the left. It is definitely limiting what she can and cannot do. She has had injections in the past without any long lasting benefit. She said her function has become so bad that she is at a stage where she feels like she needs to get the knee fixed. The left knee historically has not given her much trouble until recently when she has had progressively worsening problems with that, but her right knee is far worse at this time. They have been treated conservatively in the past for the above stated problem and despite conservative measures, they continue to have progressive pain and severe functional limitations and dysfunction. They have failed non-operative management including home exercise, medications. It is felt that they would benefit from undergoing total joint replacement. Risks and benefits of the procedure have been discussed with the patient and they elect to proceed with surgery. There are no active contraindications to surgery such as ongoing infection or rapidly progressive neurological disease.   Allergies Penicillins. Hives, Swelling. Childhood RXN  Problem List/Past Medical Primary osteoarthritis of both knees (715.16  M17.0) High blood pressure Hypercholesterolemia  Arrhythmia Gastroesophageal Reflux Disease Osteoarthritis Kidney Stone Menopause Measles Rubella Scarlet Fever  Family History Cancer. Father. Cerebrovascular Accident. Paternal Grandmother. Hypertension. Father, Mother, Paternal Grandmother. Liver Disease, Chronic. Maternal Grandmother, Mother. Osteoarthritis. Mother. Rheumatoid Arthritis.  Father.  Social History Living situation. live with spouse Marital status. married Most recent primary occupation. Realtor Exercise. Exercises rarely; does other Children. 1 Current drinker. 11/11/2013: Currently drinks wine only occasionally per week Current work status. working full time Tobacco use. Never smoker. 11/11/2013 Tobacco / smoke exposure. 11/11/2013: no No history of drug/alcohol rehab Not under pain contract Number of flights of stairs before winded. 2-3 Post-Surgical Plans. Insurance underwriter. Living Will, Healthcare POA  Medication History Lisinopril-Hydrochlorothiazide (20-12.5MG  Tablet, Oral) Active. Etodolac ER (400MG  Tablet ER 24HR, Oral) Active. Toprol XL (50MG  Tablet ER, Oral) Active. Lipitor (20MG  Tablet, Oral) Active. Glucosamine Chondroitin Complx ( Oral) Active. Multivitamin ( Oral) Active. Calcium Carbonate (600MG  Tablet, Oral) Active. Omega 3 ( Oral) Specific dose unknown - Active. Vitamin D (400UNIT Tablet, Oral) Active. Aspirin EC (325MG  Tablet DR, Oral) Active.  Past Surgical History Cataract Surgery. bilateral Dilation and Curettage of Uterus Tonsillectomy  Review of Systems General:Not Present- Chills, Fever, Night Sweats, Fatigue, Weight Gain, Weight Loss and Memory Loss. Skin:Not Present- Hives, Itching, Rash, Eczema and Lesions. HEENT:Not Present- Tinnitus, Headache, Double Vision, Visual Loss, Hearing Loss and Dentures. Respiratory:Not Present- Shortness of breath with exertion, Shortness of breath at rest, Allergies, Coughing up blood and Chronic Cough. Cardiovascular:Not Present- Chest Pain, Racing/skipping heartbeats, Difficulty Breathing Lying Down, Murmur, Swelling and Palpitations. Gastrointestinal:Not Present- Bloody Stool, Heartburn, Abdominal Pain, Vomiting, Nausea, Constipation, Diarrhea, Difficulty Swallowing, Jaundice and Loss of appetitie. Female Genitourinary:Not Present- Blood in Urine,  Urinary frequency, Weak urinary stream, Discharge, Flank Pain, Incontinence, Painful Urination, Urgency, Urinary Retention and Urinating at Night. Musculoskeletal:Present- Joint Pain and Morning Stiffness. Not Present- Muscle Weakness, Muscle Pain, Joint Swelling, Back Pain and Spasms. Neurological:Not Present- Tremor, Dizziness, Blackout spells, Paralysis, Difficulty with balance and Weakness. Psychiatric:Not Present- Insomnia.    Vitals BP: 134/88 (Sitting, Right Arm, Standard)   Physical Exam The physical exam findings are as follows:   General Mental Status - Alert, cooperative and good historian. General Appearance- pleasant. Not in acute distress. Orientation- Oriented X3. Build & Nutrition- Well nourished and Well developed.   Head and Neck Head- normocephalic, atraumatic . Neck Global Assessment- supple. no bruit auscultated on the right and no bruit auscultated on the left.   Eye Vision- Wears corrective lenses. Pupil- Bilateral- Regular and Round. Motion- Bilateral- EOMI.   Chest and Lung Exam Auscultation: Breath sounds:- clear at anterior chest wall and - clear at posterior chest wall. Adventitious sounds:- No Adventitious sounds.   Cardiovascular Auscultation:Rhythm- Regular rate and rhythm. Heart Sounds- S1 WNL and S2 WNL. Murmurs & Other Heart Sounds:Auscultation of the heart reveals - No Murmurs.   Abdomen Inspection:Contour- Generalized mild distention. Palpation/Percussion:Tenderness- Abdomen is non-tender to palpation. Rigidity (guarding)- Abdomen is soft. Auscultation:Auscultation of the abdomen reveals - Bowel sounds normal.   Female Genitourinary Not done, not pertinent to present illness  Musculoskeletal Her left knee shows no effusion. Range is about 5-115 degrees. There is slight tenderness medial. No lateral tenderness or instability.  Right knee with no effusion. Range about 10-100 degrees with  moderate crepitus on range of motion. Tenderness medial greater than lateral with no instability. Pulses, sensation, and motor intact in both lower extremities  RADIOGRAPHS AP of both knees and lateral show that she has advanced end stage arthritis of both knees,  medial and patellofemoral bone on bone, worse on the right than the left.   Assessment & Plan Primary osteoarthritis of both knees (715.16  M17.0) Impression: Right Knee  Note: Plan is for a Right Total Knee Replacement by Dr. Wynelle Link.  Plan is to go to [facility].  PCP - Dr. Maurice Small - Patient has been seen preoperatively and felt to be stable for surgery.  The patient does not have any contraindications and will receive TXA (tranexamic acid) prior to surgery.  Signed electronically by Ok Edwards, III PA-C

## 2014-05-24 NOTE — Progress Notes (Signed)
Clinical Social Work Department BRIEF PSYCHOSOCIAL ASSESSMENT 05/24/2014  Patient:  FINA, HEIZER     Account Number:  [id number]     Admit date:  05/24/2014  Clinical Social Worker:  Lacie Scotts  Date/Time:  05/24/2014 11:54 AM  Referred by:  Physician  Date Referred:  05/24/2014 Referred for  SNF Placement   Other Referral:   Interview type:  Patient Other interview type:    PSYCHOSOCIAL DATA Living Status:  HUSBAND Admitted from facility:   Level of care:   Primary support name:  John Primary support relationship to patient:  SPOUSE Degree of support available:   supportive    CURRENT CONCERNS Current Concerns  Post-Acute Placement   Other Concerns:    SOCIAL WORK ASSESSMENT / PLAN Pt is a 63 yr old female living at home prior to hospitalization. CSW met with pt / spouse to assist with d/c planning. This is a planned admission. Pt has made prior arrangements to have ST Rehab at [facility] following hospital d/c. CSW has contacted SNF and d/c plan has been confirmed pending BCBS authorization. CSW will continue to follow to assist with d/c planning needs.   Assessment/plan status:  Psychosocial Support/Ongoing Assessment of Needs Other assessment/ plan:   Information/referral to community resources:   Insurance coverage for SNF and ambulance transport reviewed.    PATIENT'S/FAMILY'S RESPONSE TO PLAN OF CARE: Pt is relieved surgery is over. She is comfortable at this time. Pt is motivated to begin therapy and is looking forward to rehab at [facility].    Werner Lean LCSW [phone]

## 2014-05-24 NOTE — Op Note (Signed)
Pre-operative diagnosis- Osteoarthritis  Right knee(s)  Post-operative diagnosis- Osteoarthritis Right knee(s)  Procedure-  Right  Total Knee Arthroplasty (Attune system)  Surgeon- Dione Plover. Shameeka Silliman, MD  Assistant- Molli Barrows, PA-C   Anesthesia-  Spinal  EBL-* No blood loss amount entered *   Drains Hemovac  Tourniquet time-  Total Tourniquet Time Documented: Thigh (Right) - 37 minutes Total: Thigh (Right) - 37 minutes     Complications- None  Condition-PACU - hemodynamically stable.   Brief Clinical Note  Morgan Frye is a 63 y.o. year old female with end stage OA of her right knee with progressively worsening pain and dysfunction. She has constant pain, with activity and at rest and significant functional deficits with difficulties even with ADLs. She has had extensive non-op management including analgesics, injections of cortisone and viscosupplements, and home exercise program, but remains in significant pain with significant dysfunction.Radiographs show bone on bone arthritis medial and patellofemoral. She presents now for right Total Knee Arthroplasty.    Procedure in detail---   The patient is brought into the operating room and positioned supine on the operating table. After successful administration of  Spinal,   a tourniquet is placed high on the  Right thigh(s) and the lower extremity is prepped and draped in the usual sterile fashion. Time out is performed by the operating team and then the  Right lower extremity is wrapped in Esmarch, knee flexed and the tourniquet inflated to 300 mmHg.       A midline incision is made with a ten blade through the subcutaneous tissue to the level of the extensor mechanism. A fresh blade is used to make a medial parapatellar arthrotomy. Soft tissue over the proximal medial tibia is subperiosteally elevated to the joint line with a knife and into the semimembranosus bursa with a Cobb elevator. Soft tissue over the proximal lateral tibia  is elevated with attention being paid to avoiding the patellar tendon on the tibial tubercle. The patella is everted, knee flexed 90 degrees and the ACL and PCL are removed. Findings are bone on bone medial and patellofemoral with large  Global osteophytes.      The drill is used to create a starting hole in the distal femur and the canal is thoroughly irrigated with sterile saline to remove the fatty contents. The 5 degree Right  valgus alignment guide is placed into the femoral canal and the distal femoral cutting block is pinned to remove 10 mm off the distal femur. Resection is made with an oscillating saw.      The tibia is subluxed forward and the menisci are removed. The extramedullary alignment guide is placed referencing proximally at the medial aspect of the tibial tubercle and distally along the second metatarsal axis and tibial crest. The block is pinned to remove 43mm off the more deficient medial  side. Resection is made with an oscillating saw. Size 5is the most appropriate size for the tibia and the proximal tibia is prepared with the modular drill and keel punch for that size.      The femoral sizing guide is placed and size 6 is most appropriate. Rotation is marked off the epicondylar axis and confirmed by creating a rectangular flexion gap at 90 degrees. The size 6 cutting block is pinned in this rotation and the anterior, posterior and chamfer cuts are made with the oscillating saw. The intercondylar block is then placed and that cut is made.      Trial size 5 tibial component,  trial size 6 posterior stabilized femur and a 7  mm posterior stabilized rotating platform insert trial is placed. Full extension is achieved with excellent varus/valgus and anterior/posterior balance throughout full range of motion. The patella is everted and thickness measured to be 25  mm. Free hand resection is taken to 15 mm, a 38 template is placed, lug holes are drilled, trial patella is placed, and it tracks  normally. Osteophytes are removed off the posterior femur with the trial in place. All trials are removed and the cut bone surfaces prepared with pulsatile lavage. Cement is mixed and once ready for implantation, the size 5 tibial implant, size  6 posterior stabilized femoral component, and the size 38 patella are cemented in place and the patella is held with the clamp. The trial insert is placed and the knee held in full extension. The Exparel (20 ml mixed with 30 ml saline) and .25% Bupivicaine, are injected into the extensor mechanism, posterior capsule, medial and lateral gutters and subcutaneous tissues.  All extruded cement is removed and once the cement is hard the permanent 8 mm posterior stabilized rotating platform insert is placed into the tibial tray.      The wound is copiously irrigated with saline solution and the extensor mechanism closed over a hemovac drain with #1 V-loc suture. The tourniquet is released for a total tourniquet time of 37  minutes. Flexion against gravity is 135 degrees and the patella tracks normally. Subcutaneous tissue is closed with 2.0 vicryl and subcuticular with running 4.0 Monocryl. The incision is cleaned and dried and steri-strips and a bulky sterile dressing are applied. The limb is placed into a knee immobilizer and the patient is awakened and transported to recovery in stable condition.      Please note that a surgical assistant was a medical necessity for this procedure in order to perform it in a safe and expeditious manner. Surgical assistant was necessary to retract the ligaments and vital neurovascular structures to prevent injury to them and also necessary for proper positioning of the limb to allow for anatomic placement of the prosthesis.   Dione Plover Morgan Ingber, MD    05/24/2014, 8:21 AM

## 2014-05-25 LAB — BASIC METABOLIC PANEL
ANION GAP: 12 (ref 5–15)
BUN: 10 mg/dL (ref 6–23)
CALCIUM: 9 mg/dL (ref 8.4–10.5)
CHLORIDE: 102 meq/L (ref 96–112)
CO2: 24 mEq/L (ref 19–32)
Creatinine, Ser: 0.64 mg/dL (ref 0.50–1.10)
GFR calc Af Amer: >90 mL/min (ref >90)
GFR calc non Af Amer: >90 mL/min (ref >90)
Glucose, Bld: 204 mg/dL — ABNORMAL HIGH (ref 70–99)
Potassium: 4.3 mEq/L (ref 3.7–5.3)
SODIUM: 138 meq/L (ref 137–147)

## 2014-05-25 LAB — CBC
HCT: 35.8 % — ABNORMAL LOW (ref 36.0–46.0)
HEMOGLOBIN: 12.9 g/dL (ref 12.0–15.0)
MCH: 32.9 pg (ref 26.0–34.0)
MCHC: 36 g/dL (ref 30.0–36.0)
MCV: 91.3 fL (ref 78.0–100.0)
PLATELETS: 171 10*3/uL (ref 150–400)
RBC: 3.92 MIL/uL (ref 3.87–5.11)
RDW: 12.9 % (ref 11.5–15.5)
WBC: 12.2 10*3/uL — AB (ref 4.0–10.5)

## 2014-05-25 NOTE — Progress Notes (Signed)
   Subjective: 1 Day Post-Op Procedure(s) (LRB): RIGHT TOTAL KNEE ARTHROPLASTY (Right) Patient reports pain as mild and moderate.   Patient seen in rounds with Dr. Wynelle Link. Doing better this morning. Patient is well, but has had some minor complaints of pain in the knee, requiring pain medications We will start therapy today.  Plan is to go Skilled nursing facility after hospital stay.  Objective: Vital signs in last 24 hours: Temp:  [97.4 F (36.3 C)-98.5 F (36.9 C)] 97.6 F (36.4 C) (07/28 7225) Pulse Rate:  [65-95] 65 (07/28 0637) Resp:  [13-20] 16 (07/28 0637) BP: (90-131)/(48-88) 120/78 mmHg (07/28 0637) SpO2:  [95 %-100 %] 97 % (07/28 0637)  Intake/Output from previous day:  Intake/Output Summary (Last 24 hours) at 05/25/14 0722 Last data filed at 05/25/14 0645  Gross per 24 hour  Intake 4751.25 ml  Output   3940 ml  Net 811.25 ml    Labs:  Recent Labs  05/25/14 0419  HGB 12.9    Recent Labs  05/25/14 0419  WBC 12.2*  RBC 3.92  HCT 35.8*  PLT 171    Recent Labs  05/25/14 0419  NA 138  K 4.3  CL 102  CO2 24  BUN 10  CREATININE 0.64  GLUCOSE 204*  CALCIUM 9.0   No results found for this basename: LABPT, INR,  in the last 72 hours  EXAM General - Patient is Alert, Appropriate and Oriented Extremity - Neurovascular intact Sensation intact distally Dorsiflexion/Plantar flexion intact Dressing - dressing C/D/I Motor Function - intact, moving foot and toes well on exam.  Hemovac pulled without difficulty.  Past Medical History  Diagnosis Date  . Hypertension   . History of kidney stones     x1 lithotripsy-has others not a bother at this time  . GERD (gastroesophageal reflux disease)     mild- uses Tums as needed  . Arthritis     osteoarthritis- knees, hips, rt. hip bursitis  . Transfusion history     '91- s/p childbirth  . Hemangioma of liver     very small- evaluated and considered stable- no further problems or follow up in many  years  . History of palpitations     none since on Metoprolol.    Assessment/Plan: 1 Day Post-Op Procedure(s) (LRB): RIGHT TOTAL KNEE ARTHROPLASTY (Right) Principal Problem:   OA (osteoarthritis) of knee  Estimated body mass index is 42.12 kg/(m^2) as calculated from the following:   Height as of this encounter: 5' 5.75" (1.67 m).   Weight as of this encounter: 117.482 kg (259 lb). Up with therapy Discharge to SNF - wants to look into [facility]  DVT Prophylaxis - Xarelto Weight-Bearing as tolerated to right leg D/C O2 and Pulse OX and try on Room Air  Arlee Muslim, PA-C Orthopaedic Surgery 05/25/2014, 7:22 AM

## 2014-05-25 NOTE — Progress Notes (Signed)
Physical Therapy Treatment Patient Details Name: Morgan Frye MRN: [id number] DOB: 1951/03/15 Today's Date: 05/25/2014    History of Present Illness RTKA    PT Comments    POD # 1 am session.  Applied KI and instructed pt on use for amb and when to D/C.  Assisted pt OOB with increased time and VC's on proper tech.  Pt required increased time to adjust to position change demon mild dizziness.  Assisted with amb limited distance due to pain and fatigue.  Mild unsteady gait esp with turns.  Pt progressing slowly and will need ST Rehab at SNF prior to returning home.   Follow Up Recommendations  SNF     Equipment Recommendations       Recommendations for Other Services       Precautions / Restrictions Precautions Precautions: Knee Precaution Comments: Instructed pt on KI use for amb Knee Immobilizer - Right: Discontinue once straight leg raise with < 10 degree lag Restrictions Weight Bearing Restrictions: No Other Position/Activity Restrictions: WBAT    Mobility  Bed Mobility               General bed mobility comments: Pt OOB in recliner  Transfers Overall transfer level: Needs assistance Equipment used: Rolling walker (2 wheeled) Transfers: Sit to/from Stand Sit to Stand: Min guard;Min assist         General transfer comment: 25% VC's on proper tech and safety with turns  Ambulation/Gait Ambulation/Gait assistance: Min assist Ambulation Distance (Feet): 14 Feet Assistive device: Rolling walker (2 wheeled) Gait Pattern/deviations: Step-to pattern;Decreased stance time - right Gait velocity: decreased   General Gait Details: 25% VC's on proper sequencing and increased time.  Amb distance limited by c/o pain 8/10 and MAX c/o fatigue.   Stairs            Wheelchair Mobility    Modified Rankin (Stroke Patients Only)       Balance                                    Cognition                            Exercises    Total Knee Replacement TE's 10 reps B LE ankle pumps 10 reps towel squeezes 10 reps knee presses 5 reps heel slides  5 reps SAQ's 5 reps SLR's 5 reps ABD Followed by ICE  TE's limited by pain level    General Comments        Pertinent Vitals/Pain C/o 8/10 knee pain Pre medicated ICE applied    Home Living                      Prior Function            PT Goals (current goals can now be found in the care plan section) Progress towards PT goals: Progressing toward goals    Frequency       PT Plan      Co-evaluation             End of Session Equipment Utilized During Treatment: Right knee immobilizer;Gait belt Activity Tolerance: Patient limited by fatigue;Patient limited by pain Patient left: in chair;with call bell/phone within reach;with family/visitor present     Time: 4403-4742 PT Time Calculation (min): 25 min  Charges:  $Gait Training: 8-22 mins $  Therapeutic Exercise: 8-22 mins                    G Codes:      Rica Koyanagi  PTA [facility]  Acute  Rehab Pager      [phone]

## 2014-05-25 NOTE — Progress Notes (Signed)
Physical Therapy Treatment Patient Details Name: PATRIA WARZECHA MRN: [id number] DOB: [birth year] Today's Date: 05/25/2014    History of Present Illness RTKA    PT Comments    POD # 1 pm session.  Pt progressing slowly.  Assisted OOB and amb in hallway second time.  Increased amb distance but not by much.  Unsteady gait.  Will need ST Rehab at SNF prior to D/C to home.  Follow Up Recommendations  SNF     Equipment Recommendations       Recommendations for Other Services       Precautions / Restrictions Precautions Precautions: Knee Precaution Comments: Instructed pt on KI use for amb Knee Immobilizer - Right: Discontinue once straight leg raise with < 10 degree lag Restrictions Weight Bearing Restrictions: No Other Position/Activity Restrictions: WBAT    Mobility  Bed Mobility Overal bed mobility: Needs Assistance Bed Mobility: Supine to Sit;Sit to Supine     Supine to sit: Min assist Sit to supine: Min assist   General bed mobility comments: Required Min Assist to support R LE as pt c/o increased pain > am.  Transfers Overall transfer level: Needs assistance Equipment used: Rolling walker (2 wheeled) Transfers: Sit to/from Stand Sit to Stand: Min assist;Min guard         General transfer comment: 25% VC's on proper tech and safety with turns plus increased time.  Much slower gait this pm.  Unsteady.  Ambulation/Gait Ambulation/Gait assistance: Min assist Ambulation Distance (Feet): 22 Feet Assistive device: Rolling walker (2 wheeled) Gait Pattern/deviations: Step-to pattern;Decreased stance time - right Gait velocity: decreased   General Gait Details: Still unsteady and limited distance due to pain level.     Stairs            Wheelchair Mobility    Modified Rankin (Stroke Patients Only)       Balance                                    Cognition                            Exercises      General Comments         Pertinent Vitals/Pain C/0 9/10 Not yet time for meds ICE applied    Home Living                      Prior Function            PT Goals (current goals can now be found in the care plan section) Progress towards PT goals: Progressing toward goals    Frequency       PT Plan      Co-evaluation             End of Session Equipment Utilized During Treatment: Right knee immobilizer;Gait belt Activity Tolerance: Patient limited by fatigue;Patient limited by pain Patient left: in bed;with call bell/phone within reach     Time: 1305-1330 PT Time Calculation (min): 25 min  Charges:  $Gait Training: 8-22 mins $Therapeutic Activity: 8-22 mins                    G Codes:      Rica Koyanagi  PTA [facility]  Acute  Rehab Pager      [phone]

## 2014-05-25 NOTE — Evaluation (Signed)
Occupational Therapy Evaluation Patient Details Name: Morgan Frye MRN: [id number] DOB: September 06, 1951 Today's Date: 05/25/2014    History of Present Illness RTKA   Clinical Impression   Pt educated on AE for LB self care and did well with practicing with this AE. Pt motivated and tolerated up to chair well. Had just been up to bathroom with nursing tech when OT arrived. Assisted to sit up in recliner. Will benefit from continued OT to progress ADL independence.     Follow Up Recommendations  SNF    Equipment Recommendations  None recommended by OT    Recommendations for Other Services       Precautions / Restrictions Precautions Precautions: Knee Restrictions Weight Bearing Restrictions: No      Mobility Bed Mobility Overal bed mobility: Needs Assistance Bed Mobility: Supine to Sit;Sit to Supine     Supine to sit: Min guard Sit to supine: Min guard      Transfers Overall transfer level: Needs assistance Equipment used: Rolling walker (2 wheeled) Transfers: Sit to/from Stand Sit to Stand: Min guard         General transfer comment: min verbal cues hand placement.    Balance                                            ADL Overall ADL's : Needs assistance/impaired Eating/Feeding: Independent;Sitting   Grooming: Set up;Sitting   Upper Body Bathing: Set up;Sitting   Lower Body Bathing: Minimal assistance;Sit to/from stand   Upper Body Dressing : Set up;Sitting   Lower Body Dressing: Moderate assistance;Sit to/from stand   Toilet Transfer: Min guard;Stand-pivot;RW   Toileting- Water quality scientist and Hygiene: Minimal assistance;Sit to/from stand;Min guard         General ADL Comments: Educated on AE options for LB self care. Pt tried reacher to doff sock with supervision and min cues. Donned sock with min assist and sock aid. Educatd on long shoe horn and long handled sponge also. Pt interested in obtaining AE kit and educated on  coverage. She just came out of bathroom with nursing tech for toileting so up to chair with OT.      Vision                     Perception     Praxis      Pertinent Vitals/Pain 2/10 at start of session up to 6-7/10 over to chair; down to 2/10 with rest; ice.     Hand Dominance     Extremity/Trunk Assessment Upper Extremity Assessment Upper Extremity Assessment: Overall WFL for tasks assessed           Communication Communication Communication: No difficulties   Cognition Arousal/Alertness: Awake/alert Behavior During Therapy: WFL for tasks assessed/performed Overall Cognitive Status: Within Functional Limits for tasks assessed                     General Comments       Exercises       Shoulder Instructions      Home Living Family/patient expects to be discharged to:: Private residence (plans SNF) Living Arrangements: Spouse/significant other Available Help at Discharge: Family Type of Home: House       Home Layout: Two level Alternate Level Stairs-Number of Steps: 13   Bathroom Shower/Tub: Occupational psychologist: Handicapped height  Home Equipment: Shower seat - built in;Bedside commode          Prior Functioning/Environment Level of Independence: Independent             OT Diagnosis: Generalized weakness   OT Problem List: Decreased strength;Decreased knowledge of use of DME or AE   OT Treatment/Interventions: Self-care/ADL training;Patient/family education;Therapeutic activities;DME and/or AE instruction    OT Goals(Current goals can be found in the care plan section) Acute Rehab OT Goals Patient Stated Goal: to go to [facility] and be independent again OT Goal Formulation: With patient Time For Goal Achievement: 06/01/14 Potential to Achieve Goals: Good  OT Frequency: Min 2X/week   Barriers to D/C:            Co-evaluation              End of Session Equipment Utilized During Treatment: Gait  belt;Rolling walker CPM Right Knee CPM Right Knee: Off  Activity Tolerance: Patient tolerated treatment well Patient left: in chair;with call bell/phone within reach;with family/visitor present   Time: 0940-1010 OT Time Calculation (min): 30 min Charges:  OT General Charges $OT Visit: 1 Procedure OT Evaluation $Initial OT Evaluation Tier I: 1 Procedure OT Treatments $Therapeutic Activity: 8-22 mins G-Codes:    Jules Schick [phone] 05/25/2014, 10:29 AM

## 2014-05-25 NOTE — Discharge Instructions (Addendum)
° °Dr. Frank Aluisio °Total Joint Specialist °[facility] °[address] °[place], [place] [address] °[phone] ° °TOTAL KNEE REPLACEMENT POSTOPERATIVE DIRECTIONS ° ° ° °Knee Rehabilitation, Guidelines Following Surgery  °Results after knee surgery are often greatly improved when you follow the exercise, range of motion and muscle strengthening exercises prescribed by your doctor. Safety measures are also important to protect the knee from further injury. Any time any of these exercises cause you to have increased pain or swelling in your knee joint, decrease the amount until you are comfortable again and slowly increase them. If you have problems or questions, call your caregiver or physical therapist for advice.  ° °HOME CARE INSTRUCTIONS  °Remove items at home which could result in a fall. This includes throw rugs or furniture in walking pathways.  °Continue medications as instructed at time of discharge. °You may have some home medications which will be placed on hold until you complete the course of blood thinner medication.  °You may start showering once you are discharged home but do not submerge the incision under water. Just pat the incision dry and apply a dry gauze dressing on daily. °Walk with walker as instructed.  °You may resume a sexual relationship in one month or when given the OK by  your doctor.  °· Use walker as long as suggested by your caregivers. °· Avoid periods of inactivity such as sitting longer than an hour when not asleep. This helps prevent blood clots.  °You may put full weight on your legs and walk as much as is comfortable.  °You may return to work once you are cleared by your doctor.  °Do not drive a car for 6 weeks or until released by you surgeon.  °· Do not drive while taking narcotics.  °Wear the elastic stockings for three weeks following surgery during the day but you may remove then at night. °Make sure you keep all of your appointments after your  operation with all of your doctors and caregivers. You should call the office at the above phone number and make an appointment for approximately two weeks after the date of your surgery. °Change the dressing daily and reapply a dry dressing each time. °Please pick up a stool softener and laxative for home use as long as you are requiring pain medications. °· Continue to use ice on the knee for pain and swelling from surgery. You may notice swelling that will progress down to the foot and ankle.  This is normal after surgery.  Elevate the leg when you are not up walking on it.   °It is important for you to complete the blood thinner medication as prescribed by your doctor. °· Continue to use the breathing machine which will help keep your temperature down.  It is common for your temperature to cycle up and down following surgery, especially at night when you are not up moving around and exerting yourself.  The breathing machine keeps your lungs expanded and your temperature down. ° °RANGE OF MOTION AND STRENGTHENING EXERCISES  °Rehabilitation of the knee is important following a knee injury or an operation. After just a few days of immobilization, the muscles of the thigh which control the knee become weakened and shrink (atrophy). Knee exercises are designed to build up the tone and strength of the thigh muscles and to improve knee motion. Often times heat used for twenty to thirty minutes before working out will loosen up your tissues and help with improving the   range of motion but do not use heat for the first two weeks following surgery. These exercises can be done on a training (exercise) mat, on the floor, on a table or on a bed. Use what ever works the best and is most comfortable for you Knee exercises include:  Leg Lifts - While your knee is still immobilized in a splint or cast, you can do straight leg raises. Lift the leg to 60 degrees, hold for 3 sec, and slowly lower the leg. Repeat 10-20 times 2-3  times daily. Perform this exercise against resistance later as your knee gets better.  Quad and Hamstring Sets - Tighten up the muscle on the front of the thigh (Quad) and hold for 5-10 sec. Repeat this 10-20 times hourly. Hamstring sets are done by pushing the foot backward against an object and holding for 5-10 sec. Repeat as with quad sets.  A rehabilitation program following serious knee injuries can speed recovery and prevent re-injury in the future due to weakened muscles. Contact your doctor or a physical therapist for more information on knee rehabilitation.   SKILLED REHAB INSTRUCTIONS: If the patient is transferred to a skilled rehab facility following release from the hospital, a list of the current medications will be sent to the facility for the patient to continue.  When discharged from the skilled rehab facility, please have the facility set up the patient's [place] prior to being released. Also, the skilled facility will be responsible for providing the patient with their medications at time of release from the facility to include their pain medication, the muscle relaxants, and their blood thinner medication. If the patient is still at the rehab facility at time of the two week follow up appointment, the skilled rehab facility will also need to assist the patient in arranging follow up appointment in our office and any transportation needs.  MAKE SURE YOU:  Understand these instructions.  Will watch your condition.  Will get help right away if you are not doing well or get worse.    Pick up stool softner and laxative for home. Do not submerge incision under water. May shower. Continue to use ice for pain and swelling from surgery.  Take Xarelto for two and a half more weeks, then discontinue Xarelto. Once the patient has completed the Xarelto, they may resume the 81 mg Aspirin.  When discharged from the skilled rehab facility, please have the facility set up  the patient's [place] prior to being released.  Also provide the patient with their medications at time of release from the facility to include their pain medication, the muscle relaxants, and their blood thinner medication.  If the patient is still at the rehab facility at time of follow up appointment, please also assist the patient in arranging follow up appointment in our office and any transportation needs.     Information on my medicine - XARELTO (Rivaroxaban)  This medication education was reviewed with me or my healthcare representative as part of my discharge preparation.  The pharmacist that spoke with me during my hospital stay was:  Angela Adam [facility]  Why was Xarelto prescribed for you? Xarelto was prescribed for you to reduce the risk of blood clots forming after orthopedic surgery. The medical term for these abnormal blood clots is venous thromboembolism (VTE).  What do you need to know about xarelto ? Take your Xarelto ONCE DAILY at the same time every day. You may take it either  either with or without food. ° °If you have difficulty swallowing the tablet whole, you may crush it and mix in applesauce just prior to taking your dose. ° °Take Xarelto® exactly as prescribed by your doctor and DO NOT stop taking Xarelto® without talking to the doctor who prescribed the medication.  Stopping without other VTE prevention medication to take the place of Xarelto® may increase your risk of developing a clot. ° °After discharge, you should have regular check-up appointments with your healthcare provider that is prescribing your Xarelto®.   ° °What do you do if you miss a dose? °If you miss a dose, take it as soon as you remember on the same day then continue your regularly scheduled once daily regimen the next day. Do not take two doses of Xarelto® on the same day.  ° °Important Safety Information °A possible side effect of Xarelto® is bleeding. You should call your  healthcare provider right away if you experience any of the following: °  Bleeding from an injury or your nose that does not stop. °  Unusual colored urine (red or dark brown) or unusual colored stools (red or black). °  Unusual bruising for unknown reasons. °  A serious fall or if you hit your head (even if there is no bleeding). ° °Some medicines may interact with Xarelto® and might increase your risk of bleeding while on Xarelto®. To help avoid this, consult your healthcare provider or pharmacist prior to using any new prescription or non-prescription medications, including herbals, vitamins, non-steroidal anti-inflammatory drugs (NSAIDs) and supplements. ° °This website has more information on Xarelto®: www.xarelto.com. ° ° °

## 2014-05-26 DIAGNOSIS — D62 Acute posthemorrhagic anemia: Secondary | ICD-10-CM | POA: Diagnosis not present

## 2014-05-26 DIAGNOSIS — E871 Hypo-osmolality and hyponatremia: Secondary | ICD-10-CM | POA: Diagnosis not present

## 2014-05-26 LAB — CBC
HEMATOCRIT: 32.4 % — AB (ref 36.0–46.0)
HEMOGLOBIN: 11.4 g/dL — AB (ref 12.0–15.0)
MCH: 32.3 pg (ref 26.0–34.0)
MCHC: 35.2 g/dL (ref 30.0–36.0)
MCV: 91.8 fL (ref 78.0–100.0)
Platelets: 184 10*3/uL (ref 150–400)
RBC: 3.53 MIL/uL — ABNORMAL LOW (ref 3.87–5.11)
RDW: 13.1 % (ref 11.5–15.5)
WBC: 11.4 10*3/uL — ABNORMAL HIGH (ref 4.0–10.5)

## 2014-05-26 LAB — BASIC METABOLIC PANEL
Anion gap: 13 (ref 5–15)
BUN: 12 mg/dL (ref 6–23)
CALCIUM: 9.2 mg/dL (ref 8.4–10.5)
CO2: 24 meq/L (ref 19–32)
Chloride: 98 mEq/L (ref 96–112)
Creatinine, Ser: 0.61 mg/dL (ref 0.50–1.10)
GFR calc Af Amer: >90 mL/min (ref >90)
GFR calc non Af Amer: >90 mL/min (ref >90)
GLUCOSE: 216 mg/dL — AB (ref 70–99)
POTASSIUM: 4.3 meq/L (ref 3.7–5.3)
SODIUM: 135 meq/L — AB (ref 137–147)

## 2014-05-26 MED ORDER — TRAMADOL HCL 50 MG PO TABS: 50.0000 mg | ORAL_TABLET | Freq: Four times a day (QID) | ORAL | Status: DC | PRN

## 2014-05-26 MED ORDER — ACETAMINOPHEN 325 MG PO TABS: 650.0000 mg | ORAL_TABLET | Freq: Four times a day (QID) | ORAL | Status: DC | PRN

## 2014-05-26 MED ORDER — POLYETHYLENE GLYCOL 3350 17 G PO PACK: 17.0000 g | PACK | Freq: Every day | ORAL | Status: DC | PRN

## 2014-05-26 MED ORDER — OXYCODONE HCL 5 MG PO TABS: 5.0000 mg | ORAL_TABLET | ORAL | Status: DC | PRN

## 2014-05-26 MED ORDER — METOCLOPRAMIDE HCL 5 MG PO TABS: 5.0000 mg | ORAL_TABLET | Freq: Three times a day (TID) | ORAL | Status: DC | PRN

## 2014-05-26 MED ORDER — RIVAROXABAN 10 MG PO TABS: 10.0000 mg | ORAL_TABLET | Freq: Every day | ORAL | Status: DC

## 2014-05-26 MED ORDER — BISACODYL 10 MG RE SUPP: 10.0000 mg | Freq: Every day | RECTAL | Status: DC | PRN

## 2014-05-26 MED ORDER — ONDANSETRON HCL 4 MG PO TABS: 4.0000 mg | ORAL_TABLET | Freq: Four times a day (QID) | ORAL | Status: DC | PRN

## 2014-05-26 MED ORDER — METHOCARBAMOL 500 MG PO TABS: 500.0000 mg | ORAL_TABLET | Freq: Four times a day (QID) | ORAL | Status: DC | PRN

## 2014-05-26 MED ORDER — DSS 100 MG PO CAPS: 100.0000 mg | ORAL_CAPSULE | Freq: Two times a day (BID) | ORAL | Status: DC

## 2014-05-26 NOTE — Progress Notes (Signed)
Clinical Social Work Department CLINICAL SOCIAL WORK PLACEMENT NOTE 05/26/2014  Patient:  Morgan Frye, Morgan Frye  Account Number:  [id number] Admit date:  05/24/2014  Clinical Social Worker:  Werner Lean, LCSW  Date/time:  05/24/2014 12:01 PM  Clinical Social Work is seeking post-discharge placement for this patient at the following level of care:   SKILLED NURSING   (*CSW will update this form in Epic as items are completed)     Patient/family provided with [facility] Department of Clinical Social Work's list of facilities offering this level of care within the geographic area requested by the patient (or if unable, by the patient's family).  05/24/2014  Patient/family informed of their freedom to choose among providers that offer the needed level of care, that participate in Medicare, Medicaid or managed care program needed by the patient, have an available bed and are willing to accept the patient.    Patient/family informed of [facility]' ownership interest in [facility], as well as of the fact that they are under no obligation to receive care at this facility.  PASARR submitted to EDS on 05/24/2014 PASARR number received on 05/24/2014  FL2 transmitted to all facilities in geographic area requested by pt/family on  05/24/2014 FL2 transmitted to all facilities within larger geographic area on   Patient informed that his/her managed care company has contracts with or will negotiate with  certain facilities, including the following:     Patient/family informed of bed offers received:  05/24/2014 Patient chooses bed at [facility]ician recommends and patient chooses bed at    Patient to be transferred to [facility] on  05/26/2014 Patient to be transferred to facility by Spouse Patient and family notified of transfer on 05/26/2014 Name of family member notified:  Spouse  The following physician request were entered in Epic:   Additional Comments: Pt / spouse  are in agreement with d/c to SNF today. PT felt pt could transport by car. NSG reviewed d/c summary, scripts,avs. Scripts are included in d/c packet. [employer] provided prior authorization for SNF placement.  Werner Lean LCSW [phone]

## 2014-05-26 NOTE — Discharge Summary (Signed)
Physician Discharge Summary   Patient ID: Morgan Frye MRN: [id number] DOB/AGE: 05/18/1951 63 y.o.  Admit date: 05/24/2014 Discharge date: 05-26-2014  Primary Diagnosis:  Osteoarthritis Right knee(s)  Admission Diagnoses:  Past Medical History  Diagnosis Date  . Hypertension   . History of kidney stones     x1 lithotripsy-has others not a bother at this time  . GERD (gastroesophageal reflux disease)     mild- uses Tums as needed  . Arthritis     osteoarthritis- knees, hips, rt. hip bursitis  . Transfusion history     '91- s/p childbirth  . Hemangioma of liver     very small- evaluated and considered stable- no further problems or follow up in many years  . History of palpitations     none since on Metoprolol.   Discharge Diagnoses:   Principal Problem:   OA (osteoarthritis) of knee  Estimated body mass index is 42.12 kg/(m^2) as calculated from the following:   Height as of this encounter: 5' 5.75" (1.67 m).   Weight as of this encounter: 117.482 kg (259 lb).  Procedure:  Procedure(s) (LRB): RIGHT TOTAL KNEE ARTHROPLASTY (Right)   Consults: None  HPI: Morgan Frye is a 63 y.o. year old female with end stage OA of her right knee with progressively worsening pain and dysfunction. She has constant pain, with activity and at rest and significant functional deficits with difficulties even with ADLs. She has had extensive non-op management including analgesics, injections of cortisone and viscosupplements, and home exercise program, but remains in significant pain with significant dysfunction.Radiographs show bone on bone arthritis medial and patellofemoral. She presents now for right Total Knee Arthroplasty.   Laboratory Data: Admission on 05/24/2014  Component Date Value Ref Range Status  . ABO/RH(D) 05/24/2014 A POS   Final  . Antibody Screen 05/24/2014 NEG   Final  . Sample Expiration 05/24/2014 05/27/2014   Final  . ABO/RH(D) 05/24/2014 A POS   Final  . WBC  05/25/2014 12.2* 4.0 - 10.5 K/uL Final  . RBC 05/25/2014 3.92  3.87 - 5.11 MIL/uL Final  . Hemoglobin 05/25/2014 12.9  12.0 - 15.0 g/dL Final  . HCT 05/25/2014 35.8* 36.0 - 46.0 % Final  . MCV 05/25/2014 91.3  78.0 - 100.0 fL Final  . MCH 05/25/2014 32.9  26.0 - 34.0 pg Final  . MCHC 05/25/2014 36.0  30.0 - 36.0 g/dL Final  . RDW 05/25/2014 12.9  11.5 - 15.5 % Final  . Platelets 05/25/2014 171  150 - 400 K/uL Final  . Sodium 05/25/2014 138  137 - 147 mEq/L Final  . Potassium 05/25/2014 4.3  3.7 - 5.3 mEq/L Final  . Chloride 05/25/2014 102  96 - 112 mEq/L Final  . CO2 05/25/2014 24  19 - 32 mEq/L Final  . Glucose, Bld 05/25/2014 204* 70 - 99 mg/dL Final  . BUN 05/25/2014 10  6 - 23 mg/dL Final  . Creatinine, Ser 05/25/2014 0.64  0.50 - 1.10 mg/dL Final  . Calcium 05/25/2014 9.0  8.4 - 10.5 mg/dL Final  . GFR calc non Af Amer 05/25/2014 >90  >90 mL/min Final  . GFR calc Af Amer 05/25/2014 >90  >90 mL/min Final   Comment: (NOTE)                          The eGFR has been calculated using the CKD EPI equation.  This calculation has not been validated in all clinical situations.                          eGFR's persistently <90 mL/min signify possible Chronic Kidney                          Disease.  . Anion gap 05/25/2014 12  5 - 15 Final  . WBC 05/26/2014 11.4* 4.0 - 10.5 K/uL Final  . RBC 05/26/2014 3.53* 3.87 - 5.11 MIL/uL Final  . Hemoglobin 05/26/2014 11.4* 12.0 - 15.0 g/dL Final  . HCT 05/26/2014 32.4* 36.0 - 46.0 % Final  . MCV 05/26/2014 91.8  78.0 - 100.0 fL Final  . MCH 05/26/2014 32.3  26.0 - 34.0 pg Final  . MCHC 05/26/2014 35.2  30.0 - 36.0 g/dL Final  . RDW 05/26/2014 13.1  11.5 - 15.5 % Final  . Platelets 05/26/2014 184  150 - 400 K/uL Final  . Sodium 05/26/2014 135* 137 - 147 mEq/L Final  . Potassium 05/26/2014 4.3  3.7 - 5.3 mEq/L Final  . Chloride 05/26/2014 98  96 - 112 mEq/L Final  . CO2 05/26/2014 24  19 - 32 mEq/L Final  . Glucose, Bld  05/26/2014 216* 70 - 99 mg/dL Final  . BUN 05/26/2014 12  6 - 23 mg/dL Final  . Creatinine, Ser 05/26/2014 0.61  0.50 - 1.10 mg/dL Final  . Calcium 05/26/2014 9.2  8.4 - 10.5 mg/dL Final  . GFR calc non Af Amer 05/26/2014 >90  >90 mL/min Final  . GFR calc Af Amer 05/26/2014 >90  >90 mL/min Final   Comment: (NOTE)                          The eGFR has been calculated using the CKD EPI equation.                          This calculation has not been validated in all clinical situations.                          eGFR's persistently <90 mL/min signify possible Chronic Kidney                          Disease.  Georgiann Hahn gap 05/26/2014 13  5 - 15 Final  Hospital Outpatient Visit on 05/12/2014  Component Date Value Ref Range Status  . MRSA, PCR 05/12/2014 NEGATIVE  NEGATIVE Final  . Staphylococcus aureus 05/12/2014 NEGATIVE  NEGATIVE Final   Comment:                                 The Xpert SA Assay (FDA                          approved for NASAL specimens                          in patients over 58 years of age),                          is one component of  a comprehensive surveillance                          program.  Test performance has                          been validated by [facility] for patients greater                          than or equal to 69 year old.                          It is not intended                          to diagnose infection nor to                          guide or monitor treatment.  Marland Kitchen aPTT 05/12/2014 31  24 - 37 seconds Final  . WBC 05/12/2014 5.4  4.0 - 10.5 K/uL Final  . RBC 05/12/2014 5.12* 3.87 - 5.11 MIL/uL Final  . Hemoglobin 05/12/2014 17.0* 12.0 - 15.0 g/dL Final  . HCT 05/12/2014 47.3* 36.0 - 46.0 % Final  . MCV 05/12/2014 92.4  78.0 - 100.0 fL Final  . MCH 05/12/2014 33.2  26.0 - 34.0 pg Final  . MCHC 05/12/2014 35.9  30.0 - 36.0 g/dL Final  . RDW 05/12/2014 12.8  11.5 - 15.5 % Final  .  Platelets 05/12/2014 180  150 - 400 K/uL Final  . Sodium 05/12/2014 143  137 - 147 mEq/L Final  . Potassium 05/12/2014 4.0  3.7 - 5.3 mEq/L Final  . Chloride 05/12/2014 105  96 - 112 mEq/L Final  . CO2 05/12/2014 23  19 - 32 mEq/L Final  . Glucose, Bld 05/12/2014 136* 70 - 99 mg/dL Final  . BUN 05/12/2014 18  6 - 23 mg/dL Final  . Creatinine, Ser 05/12/2014 0.71  0.50 - 1.10 mg/dL Final  . Calcium 05/12/2014 10.3  8.4 - 10.5 mg/dL Final  . Total Protein 05/12/2014 7.3  6.0 - 8.3 g/dL Final  . Albumin 05/12/2014 4.1  3.5 - 5.2 g/dL Final  . AST 05/12/2014 59* 0 - 37 U/L Final  . ALT 05/12/2014 82* 0 - 35 U/L Final  . Alkaline Phosphatase 05/12/2014 89  39 - 117 U/L Final  . Total Bilirubin 05/12/2014 0.9  0.3 - 1.2 mg/dL Final  . GFR calc non Af Amer 05/12/2014 >90  >90 mL/min Final  . GFR calc Af Amer 05/12/2014 >90  >90 mL/min Final   Comment: (NOTE)                          The eGFR has been calculated using the CKD EPI equation.                          This calculation has not been validated in all clinical situations.                          eGFR's  persistently <90 mL/min signify possible Chronic Kidney                          Disease.  . Anion gap 05/12/2014 15  5 - 15 Final  . Prothrombin Time 05/12/2014 16.4* 11.6 - 15.2 seconds Final  . INR 05/12/2014 1.32  0.00 - 1.49 Final  . Color, Urine 05/12/2014 YELLOW  YELLOW Final  . APPearance 05/12/2014 CLEAR  CLEAR Final  . Specific Gravity, Urine 05/12/2014 1.021  1.005 - 1.030 Final  . pH 05/12/2014 6.0  5.0 - 8.0 Final  . Glucose, UA 05/12/2014 NEGATIVE  NEGATIVE mg/dL Final  . Hgb urine dipstick 05/12/2014 NEGATIVE  NEGATIVE Final  . Bilirubin Urine 05/12/2014 MODERATE* NEGATIVE Final  . Ketones, ur 05/12/2014 NEGATIVE  NEGATIVE mg/dL Final  . Protein, ur 05/12/2014 NEGATIVE  NEGATIVE mg/dL Final  . Urobilinogen, UA 05/12/2014 0.2  0.0 - 1.0 mg/dL Final  . Nitrite 05/12/2014 NEGATIVE  NEGATIVE Final  . Leukocytes, UA  05/12/2014 MODERATE* NEGATIVE Final  . Squamous Epithelial / LPF 05/12/2014 RARE  RARE Final  . WBC, UA 05/12/2014 3-6  <3 WBC/hpf Final     X-Rays:Dg Chest 2 View  05/12/2014   CLINICAL DATA:  63 year old female preoperative study for knee surgery. Hypertension. Initial encounter.  EXAM: CHEST  2 VIEW  COMPARISON:  08/08/2006.  FINDINGS: Stable lung volumes, within normal limits. Stable cardiac size at the upper limits of normal. Other mediastinal contours are within normal limits. Visualized tracheal air column is within normal limits. Lung parenchyma stable and clear. No pneumothorax or effusion. No acute osseous abnormality identified.  IMPRESSION: No acute cardiopulmonary abnormality.   Electronically Signed   By: Lars Pinks M.D.   On: 05/12/2014 11:40    EKG: Orders placed during the hospital encounter of 05/12/14  . EKG 12-LEAD  . EKG 12-LEAD     Hospital Course: Morgan Frye is a 63 y.o. who was admitted to [facility]. They were brought to the operating room on 05/24/2014 and underwent Procedure(s): RIGHT TOTAL KNEE ARTHROPLASTY.  Patient tolerated the procedure well and was later transferred to the recovery room and then to the orthopaedic floor for postoperative care.  They were given PO and IV analgesics for pain control following their surgery.  They were given 24 hours of postoperative antibiotics of  Anti-infectives   Start     Dose/Rate Route Frequency Ordered Stop   05/24/14 1900  vancomycin (VANCOCIN) IVPB 1000 mg/200 mL premix     1,000 mg 200 mL/hr over 60 Minutes Intravenous Every 12 hours 05/24/14 1044 05/24/14 1951   05/24/14 0518  vancomycin (VANCOCIN) 1,500 mg in sodium chloride 0.9 % 500 mL IVPB     1,500 mg 250 mL/hr over 120 Minutes Intravenous On call to O.R. 05/24/14 0518 05/24/14 0845   05/24/14 0518  ceFAZolin (ANCEF) IVPB 2 g/50 mL premix  Status:  Discontinued     2 g 100 mL/hr over 30 Minutes Intravenous On call to O.R. 05/24/14 0518 05/24/14  1017     and started on DVT prophylaxis in the form of Xarelto.   PT and OT were ordered for total joint protocol.  Discharge planning consulted to help with postop disposition and equipment needs.  Patient had a tough night on the evening of surgery with pain.  They started to get up OOB with therapy on day one. Hemovac drain was pulled without difficulty. Social worker got involved to  assist with placement of the patient.  Continued to work with therapy into day two.  Dressing was changed on day two and the incision was healing well but did note some blisters under the edges of the steri strips on the lower half of the incision.  Waited on insurance approval for [facility].  We received approval for placement.  Patient was seen in rounds and was ready to go to [facility].   Diet: Cardiac diet Activity:WBAT Follow-up:in 2 weeks Disposition - [facility] Discharged Condition: good       Discharge Instructions   Call MD / Call 911    Complete by:  As directed   If you experience chest pain or shortness of breath, CALL 911 and be transported to the hospital emergency room.  If you develope a fever above 101 F, pus (white drainage) or increased drainage or redness at the wound, or calf pain, call your surgeon's office.     Change dressing    Complete by:  As directed   Change dressing daily with sterile 4 x 4 inch gauze dressing and apply TED hose. Do not submerge the incision under water. May change the tegaderm dressing coverage every third day for the blisters noted along the steri strips.     Constipation Prevention    Complete by:  As directed   Drink plenty of fluids.  Prune juice may be helpful.  You may use a stool softener, such as Colace (over the counter) 100 mg twice a day.  Use MiraLax (over the counter) for constipation as needed.     Diet - low sodium heart healthy    Complete by:  As directed      Diet Carb Modified    Complete by:  As directed        Discharge instructions    Complete by:  As directed   Pick up stool softner and laxative for home. Do not submerge incision under water. May shower. Continue to use ice for pain and swelling from surgery.  Take Xarelto for two and a half more weeks, then discontinue Xarelto. Once the patient has completed the Xarelto, they may resume the 81 mg Aspirin.  When discharged from the skilled rehab facility, please have the facility set up the patient's Early prior to being released.  Also provide the patient with their medications at time of release from the facility to include their pain medication, the muscle relaxants, and their blood thinner medication.  If the patient is still at the rehab facility at time of follow up appointment, please also assist the patient in arranging follow up appointment in our office and any transportation needs.  Leave tegaderm blister coverage on the knee for three days and then change tegaderms     Do not put a pillow under the knee. Place it under the heel.    Complete by:  As directed      Do not sit on low chairs, stoools or toilet seats, as it may be difficult to get up from low surfaces    Complete by:  As directed      Driving restrictions    Complete by:  As directed   No driving until released by the physician.     Increase activity slowly as tolerated    Complete by:  As directed      Lifting restrictions    Complete by:  As directed   No lifting  until released by the physician.     Patient may shower    Complete by:  As directed   You may shower without a dressing once there is no drainage.  Do not wash over the wound.  If drainage remains, do not shower until drainage stops.     TED hose    Complete by:  As directed   Use stockings (TED hose) for 3 weeks on both leg(s).  You may remove them at night for sleeping.     Weight bearing as tolerated    Complete by:  As directed             Medication List    STOP  taking these medications       aspirin EC 81 MG tablet     CALCIUM 600 + D PO     etodolac 400 MG 24 hr tablet  Commonly known as:  LODINE XL     ibuprofen 200 MG tablet  Commonly known as:  ADVIL,MOTRIN     multivitamin with minerals Tabs tablet     OMEGA 3 PO      TAKE these medications       acetaminophen 325 MG tablet  Commonly known as:  TYLENOL  Take 2 tablets (650 mg total) by mouth every 6 (six) hours as needed for mild pain (or Fever >/= 101).     atorvastatin 20 MG tablet  Commonly known as:  LIPITOR  Take 20 mg by mouth daily.     bisacodyl 10 MG suppository  Commonly known as:  DULCOLAX  Place 1 suppository (10 mg total) rectally daily as needed for moderate constipation.     DSS 100 MG Caps  Take 100 mg by mouth 2 (two) times daily.     lisinopril-hydrochlorothiazide 20-12.5 MG per tablet  Commonly known as:  PRINZIDE,ZESTORETIC  Take 1 tablet by mouth every morning.     methocarbamol 500 MG tablet  Commonly known as:  ROBAXIN  Take 1 tablet (500 mg total) by mouth every 6 (six) hours as needed for muscle spasms.     metoCLOPramide 5 MG tablet  Commonly known as:  REGLAN  Take 1-2 tablets (5-10 mg total) by mouth every 8 (eight) hours as needed for nausea (if ondansetron (ZOFRAN) ineffective.).     metoprolol succinate 50 MG 24 hr tablet  Commonly known as:  TOPROL-XL  Take 50 mg by mouth every evening. Take with or immediately following a meal.     ondansetron 4 MG tablet  Commonly known as:  ZOFRAN  Take 1 tablet (4 mg total) by mouth every 6 (six) hours as needed for nausea.     oxyCODONE 5 MG immediate release tablet  Commonly known as:  Oxy IR/ROXICODONE  Take 1-2 tablets (5-10 mg total) by mouth every 3 (three) hours as needed for moderate pain, severe pain or breakthrough pain.     polyethylene glycol packet  Commonly known as:  MIRALAX / GLYCOLAX  Take 17 g by mouth daily as needed for mild constipation.     rivaroxaban 10 MG Tabs  tablet  Commonly known as:  XARELTO  - Take 1 tablet (10 mg total) by mouth daily with breakfast. Take Xarelto for two and a half more weeks, then discontinue Xarelto.  - Once the patient has completed the Xarelto, they may resume the 81 mg Aspirin.     traMADol 50 MG tablet  Commonly known as:  ULTRAM  Take 1-2 tablets (50-100 mg  total) by mouth every 6 (six) hours as needed (mild to moderate pain).       Follow-up Information   Follow up with Gearlean Alf, MD. Schedule an appointment as soon as possible for a visit on 06/08/2014. (Please call the office at [phone] to set up appointment time.)    Specialty:  Orthopedic Surgery   Contact information:   [address] [place] [address] [phone]       Signed: Arlee Muslim, PA-C Orthopaedic Surgery 05/26/2014, 2:19 PM

## 2014-05-26 NOTE — Progress Notes (Signed)
Physical Therapy Treatment Patient Details Name: Morgan Frye MRN: [id number] DOB: 11-22-1950 Today's Date: 05/26/2014    History of Present Illness RTKA    PT Comments    POD# 2 am session.  Pt progressing slowly with increased c/o pain and stiffness this am.  Amb limited distance and demon unsteady gait.  Performed TKR TE's with increased time.  Pt will need ST Rehab at SNF prior to D/C to home.  Follow Up Recommendations  SNF     Equipment Recommendations       Recommendations for Other Services       Precautions / Restrictions Precautions Precautions: Knee Precaution Comments: Instructed pt on KI use for amb Required Braces or Orthoses: Knee Immobilizer - Right Restrictions Weight Bearing Restrictions: No Other Position/Activity Restrictions: WBAT    Mobility  Bed Mobility               General bed mobility comments: Pt sitting EOB on arrival  Transfers Overall transfer level: Needs assistance Equipment used: Rolling walker (2 wheeled) Transfers: Sit to/from Stand Sit to Stand: Min assist;Min guard         General transfer comment: 25% VC's on proper tech and safety with turns plus increased time.  Increased c/o pain and stiffness this am.  Moving slow.  Ambulation/Gait Ambulation/Gait assistance: Min assist Ambulation Distance (Feet): 26 Feet Assistive device: Rolling walker (2 wheeled) Gait Pattern/deviations: Step-to pattern;Decreased stance time - right;Trunk flexed Gait velocity: decreased   General Gait Details: Still unsteady and limited distance due to pain level.     Stairs            Wheelchair Mobility    Modified Rankin (Stroke Patients Only)       Balance                                    Cognition                            Exercises      General Comments        Pertinent Vitals/Pain C/o 7/10 Pre medicated ICE applied    Home Living                      Prior  Function            PT Goals (current goals can now be found in the care plan section) Progress towards PT goals: Progressing toward goals    Frequency  7X/week    PT Plan      Co-evaluation             End of Session Equipment Utilized During Treatment: Right knee immobilizer;Gait belt Activity Tolerance: Patient limited by fatigue;Patient limited by pain       Time: 0915-0940 PT Time Calculation (min): 25 min  Charges:  $Gait Training: 8-22 mins $Therapeutic Exercise: 8-22 mins                    G Codes:      Rica Koyanagi  PTA [facility]  Acute  Rehab Pager      [phone]

## 2014-05-26 NOTE — Progress Notes (Signed)
   Subjective: 2 Days Post-Op Procedure(s) (LRB): RIGHT TOTAL KNEE ARTHROPLASTY (Right) Patient reports pain as mild.   Patient seen in rounds for Dr. Wynelle Link. Patient is well, but has had some minor complaints of pain in the knee, requiring pain medications Patient is ready to go to [facility].  Objective: Vital signs in last 24 hours: Temp:  [97.6 F (36.4 C)-98.4 F (36.9 C)] 98 F (36.7 C) (07/29 1330) Pulse Rate:  [88-98] 96 (07/29 1330) Resp:  [16-19] 18 (07/29 1330) BP: (133-153)/(68-102) 147/68 mmHg (07/29 1330) SpO2:  [95 %-98 %] 98 % (07/29 1330)  Intake/Output from previous day:  Intake/Output Summary (Last 24 hours) at 05/26/14 1410 Last data filed at 05/26/14 1325  Gross per 24 hour  Intake    960 ml  Output   3250 ml  Net  -2290 ml    Intake/Output this shift: Total I/O In: 480 [P.O.:480] Out: 600 [Urine:600]  Labs:  Recent Labs  05/25/14 0419 05/26/14 0422  HGB 12.9 11.4*    Recent Labs  05/25/14 0419 05/26/14 0422  WBC 12.2* 11.4*  RBC 3.92 3.53*  HCT 35.8* 32.4*  PLT 171 184    Recent Labs  05/25/14 0419 05/26/14 0422  NA 138 135*  K 4.3 4.3  CL 102 98  CO2 24 24  BUN 10 12  CREATININE 0.64 0.61  GLUCOSE 204* 216*  CALCIUM 9.0 9.2   No results found for this basename: LABPT, INR,  in the last 72 hours  EXAM: General - Patient is Alert, Appropriate and Oriented Extremity - Neurovascular intact Sensation intact distally Dorsiflexion/Plantar flexion intact Incision - clean, dry, no drainage, healing, small tape blisters under the steri strips noted on the lower half of the incision, some ecchymosis noted on the inner knee Motor Function - intact, moving foot and toes well on exam.   Assessment/Plan: 2 Days Post-Op Procedure(s) (LRB): RIGHT TOTAL KNEE ARTHROPLASTY (Right) Procedure(s) (LRB): RIGHT TOTAL KNEE ARTHROPLASTY (Right) Past Medical History  Diagnosis Date  . Hypertension   . History of kidney stones     x1  lithotripsy-has others not a bother at this time  . GERD (gastroesophageal reflux disease)     mild- uses Tums as needed  . Arthritis     osteoarthritis- knees, hips, rt. hip bursitis  . Transfusion history     '91- s/p childbirth  . Hemangioma of liver     very small- evaluated and considered stable- no further problems or follow up in many years  . History of palpitations     none since on Metoprolol.   Principal Problem:   OA (osteoarthritis) of knee  Estimated body mass index is 42.12 kg/(m^2) as calculated from the following:   Height as of this encounter: 5' 5.75" (1.67 m).   Weight as of this encounter: 117.482 kg (259 lb). Advance diet Up with therapy Discharge to SNF Diet - Cardiac diet Follow up - in 2 weeks Activity - WBAT Disposition - Skilled nursing facility - [facility] Condition Upon Discharge - Good D/C Meds - See DC Summary DVT Prophylaxis - Leigh, PA-C Orthopaedic Surgery 05/26/2014, 2:10 PM

## 2014-05-28 ENCOUNTER — Non-Acute Institutional Stay (SKILLED_NURSING_FACILITY): Payer: BC Managed Care – PPO | Admitting: Adult Health

## 2014-05-28 ENCOUNTER — Encounter: Payer: Self-pay | Admitting: Adult Health

## 2014-05-28 DIAGNOSIS — K5900 Constipation, unspecified: Secondary | ICD-10-CM | POA: Insufficient documentation

## 2014-05-28 DIAGNOSIS — I10 Essential (primary) hypertension: Secondary | ICD-10-CM | POA: Insufficient documentation

## 2014-05-28 DIAGNOSIS — D62 Acute posthemorrhagic anemia: Secondary | ICD-10-CM

## 2014-05-28 DIAGNOSIS — E871 Hypo-osmolality and hyponatremia: Secondary | ICD-10-CM

## 2014-05-28 DIAGNOSIS — E785 Hyperlipidemia, unspecified: Secondary | ICD-10-CM | POA: Insufficient documentation

## 2014-05-28 DIAGNOSIS — M1711 Unilateral primary osteoarthritis, right knee: Secondary | ICD-10-CM

## 2014-05-28 DIAGNOSIS — M1710 Unilateral primary osteoarthritis, unspecified knee: Secondary | ICD-10-CM

## 2014-05-28 NOTE — Progress Notes (Signed)
Patient ID: Morgan Frye, female   DOB: 12-08-1950, 63 y.o.   MRN: [id number]               PROGRESS NOTE  DATE: 05/28/2014  FACILITY: Nursing Home Location: [facility]  LEVEL OF CARE: SNF (31)  Acute Visit  CHIEF COMPLAINT:  Follow-up Hospitalization   HISTORY OF PRESENT ILLNESS: This is a 63 year old female who has been admitted to [facility] on 05/26/14 from [facility] with Osteoarthritis S/P Right total knee arthroplasty. She has been admitted for a short-term rehabilitation.  REASSESSMENT OF ONGOING PROBLEM(S):  HTN: Pt 's HTN remains stable.  Denies CP, sob, DOE, pedal edema, headaches, dizziness or visual disturbances.  No complications from the medications currently being used.  Last BP : 108/66  ANEMIA: The anemia has been stable. The patient denies fatigue, melena or hematochezia. No  medications currently being used. 7/15 hgb 11.4  CONSTIPATION: The constipation remains stable. No complications from the medications presently being used. Patient denies ongoing constipation, abdominal pain, nausea or vomiting.  PAST MEDICAL HISTORY : Reviewed.  No changes/see problem list  CURRENT MEDICATIONS: Reviewed per MAR/see medication list  REVIEW OF SYSTEMS:  GENERAL: no change in appetite, no fatigue, no weight changes, no fever, chills or weakness RESPIRATORY: no cough, SOB, DOE, wheezing, hemoptysis CARDIAC: no chest pain, edema or palpitations GI: no abdominal pain, diarrhea, constipation, heart burn, nausea or vomiting  PHYSICAL EXAMINATION  GENERAL: no acute distress, morbidly obese EYES: conjunctivae normal, sclerae normal, normal eye lids NECK: supple, trachea midline, no neck masses, no thyroid tenderness, no thyromegaly LYMPHATICS: no LAN in the neck, no supraclavicular LAN RESPIRATORY: breathing is even & unlabored, BS CTAB CARDIAC: RRR, no murmur,no extra heart sounds, no edema GI: abdomen soft, normal BS, no masses, no  tenderness, no hepatomegaly, no splenomegaly EXTREMITIES:  Able to move all 4 extremities PSYCHIATRIC: the patient is alert & oriented to person, affect & behavior appropriate  LABS/RADIOLOGY: Labs reviewed: Basic Metabolic Panel:  Recent Labs  05/12/14 1020 05/25/14 0419 05/26/14 0422  NA 143 138 135*  K 4.0 4.3 4.3  CL 105 102 98  CO2 23 24 24   GLUCOSE 136* 204* 216*  BUN 18 10 12   CREATININE 0.71 0.64 0.61  CALCIUM 10.3 9.0 9.2   Liver Function Tests:  Recent Labs  05/12/14 1020  AST 59*  ALT 82*  ALKPHOS 89  BILITOT 0.9  PROT 7.3  ALBUMIN 4.1   CBC:  Recent Labs  05/12/14 1020 05/25/14 0419 05/26/14 0422  WBC 5.4 12.2* 11.4*  HGB 17.0* 12.9 11.4*  HCT 47.3* 35.8* 32.4*  MCV 92.4 91.3 91.8  PLT 180 171 184      ASSESSMENT/PLAN:  Osteoarthritis status post right total knee arthroplasty - for rehabilitation  Hyperlipidemia - continue Lipitor Hypertension - well controlled; continue Zestoretic and Toprol XL Hyponatremia - check BMP Anemia, acute blood loss - check CBC Constipation - continue Colace and MiraLax   CPT CODE: 74259   Monina Vargas - NP [facility] [phone]

## 2014-06-01 ENCOUNTER — Non-Acute Institutional Stay (SKILLED_NURSING_FACILITY): Payer: BC Managed Care – PPO | Admitting: Internal Medicine

## 2014-06-01 DIAGNOSIS — E785 Hyperlipidemia, unspecified: Secondary | ICD-10-CM

## 2014-06-01 DIAGNOSIS — I10 Essential (primary) hypertension: Secondary | ICD-10-CM

## 2014-06-01 DIAGNOSIS — M1710 Unilateral primary osteoarthritis, unspecified knee: Secondary | ICD-10-CM

## 2014-06-01 DIAGNOSIS — M1711 Unilateral primary osteoarthritis, right knee: Secondary | ICD-10-CM

## 2014-06-01 DIAGNOSIS — K5900 Constipation, unspecified: Secondary | ICD-10-CM

## 2014-06-02 ENCOUNTER — Encounter: Payer: Self-pay | Admitting: Adult Health

## 2014-06-02 ENCOUNTER — Non-Acute Institutional Stay (SKILLED_NURSING_FACILITY): Payer: BC Managed Care – PPO | Admitting: Adult Health

## 2014-06-02 DIAGNOSIS — M1710 Unilateral primary osteoarthritis, unspecified knee: Secondary | ICD-10-CM

## 2014-06-02 DIAGNOSIS — E785 Hyperlipidemia, unspecified: Secondary | ICD-10-CM

## 2014-06-02 DIAGNOSIS — D62 Acute posthemorrhagic anemia: Secondary | ICD-10-CM

## 2014-06-02 DIAGNOSIS — I10 Essential (primary) hypertension: Secondary | ICD-10-CM

## 2014-06-02 DIAGNOSIS — E871 Hypo-osmolality and hyponatremia: Secondary | ICD-10-CM

## 2014-06-02 DIAGNOSIS — M1711 Unilateral primary osteoarthritis, right knee: Secondary | ICD-10-CM

## 2014-06-02 DIAGNOSIS — K5900 Constipation, unspecified: Secondary | ICD-10-CM

## 2014-06-02 NOTE — Progress Notes (Signed)
HISTORY & PHYSICAL  DATE: 06/01/2014   FACILITY: [facility]  LEVEL OF CARE: SNF (31)  ALLERGIES:  Allergies  Allergen Reactions  . Penicillins Hives and Swelling    CHIEF COMPLAINT:  Manage right knee osteoarthritis, hypertension and hyperlipidemia  HISTORY OF PRESENT ILLNESS: Patient is a 63 year old Caucasian female.  KNEE OSTEOARTHRITIS: Patient had a history of pain and functional disability in the knee due to end-stage osteoarthritis and has failed nonsurgical conservative treatments. Patient had worsening of pain with activity and weight bearing, pain that interfered with activities of daily living & pain with passive range of motion. Therefore patient underwent total knee arthroplasty and tolerated the procedure well. Patient is admitted to this facility for sort short-term rehabilitation. Patient denies knee pain. Complaints of right lower extremity swelling.  HTN: Pt 's HTN remains stable.  Denies CP, sob, DOE, headaches, dizziness or visual disturbances.  No complications from the medications currently being used.  Last BP : 114/62.  HYPERLIPIDEMIA: No complications from the medications presently being used. Last fasting lipid panel not available.  PAST MEDICAL HISTORY :  Past Medical History  Diagnosis Date  . Hypertension   . History of kidney stones     x1 lithotripsy-has others not a bother at this time  . GERD (gastroesophageal reflux disease)     mild- uses Tums as needed  . Arthritis     osteoarthritis- knees, hips, rt. hip bursitis  . Transfusion history     '91- s/p childbirth  . Hemangioma of liver     very small- evaluated and considered stable- no further problems or follow up in many years  . History of palpitations     none since on Metoprolol.    PAST SURGICAL HISTORY: Past Surgical History  Procedure Laterality Date  . Dilation and curettage of uterus    . Tonsillectomy      child  . Cataract extraction,  bilateral Bilateral     5 yrs ago  . Lithotripsy    . Total knee arthroplasty Right 05/24/2014    Procedure: RIGHT TOTAL KNEE ARTHROPLASTY;  Surgeon: Gearlean Alf, MD;  Location: [facility];  Service: Orthopedics;  Laterality: Right;    SOCIAL HISTORY:  reports that she has never smoked. She does not have any smokeless tobacco history on file. She reports that she drinks alcohol. She reports that she does not use illicit drugs.  FAMILY HISTORY: None  CURRENT MEDICATIONS: Reviewed per MAR/see medication list  REVIEW OF SYSTEMS:  See HPI otherwise 14 point ROS is negative.  PHYSICAL EXAMINATION  VS:  See VS section  GENERAL: no acute distress, obese body habitus EYES: conjunctivae normal, sclerae normal, normal eye lids MOUTH/THROAT: lips without lesions,no lesions in the mouth,tongue is without lesions,uvula elevates in midline NECK: supple, trachea midline, no neck masses, no thyroid tenderness, no thyromegaly LYMPHATICS: no LAN in the neck, no supraclavicular LAN RESPIRATORY: breathing is even & unlabored, BS CTAB CARDIAC: RRR, no murmur,no extra heart sounds, right lower extremely +3 edema GI:  ABDOMEN: abdomen soft, normal BS, no masses, no tenderness  LIVER/SPLEEN: no hepatomegaly, no splenomegaly MUSCULOSKELETAL: HEAD: normal to inspection  EXTREMITIES: LEFT UPPER EXTREMITY: full range of motion, normal strength & tone RIGHT UPPER EXTREMITY:  full range of motion, normal strength & tone LEFT LOWER EXTREMITY:  Moderate range of motion, normal strength & tone RIGHT LOWER EXTREMITY:  range of motion not tested due to surgery, normal strength & tone  PSYCHIATRIC: the patient is alert & oriented to person, affect & behavior appropriate  LABS/RADIOLOGY:  Labs reviewed: Basic Metabolic Panel:  Recent Labs  05/12/14 1020 05/25/14 0419 05/26/14 0422  NA 143 138 135*  K 4.0 4.3 4.3  CL 105 102 98  CO2 23 24 24   GLUCOSE 136* 204* 216*  BUN 18 10 12   CREATININE 0.71 0.64  0.61  CALCIUM 10.3 9.0 9.2   Liver Function Tests:  Recent Labs  05/12/14 1020  AST 59*  ALT 82*  ALKPHOS 89  BILITOT 0.9  PROT 7.3  ALBUMIN 4.1   CBC:  Recent Labs  05/12/14 1020 05/25/14 0419 05/26/14 0422  WBC 5.4 12.2* 11.4*  HGB 17.0* 12.9 11.4*  HCT 47.3* 35.8* 32.4*  MCV 92.4 91.3 91.8  PLT 180 171 184    CHEST  2 VIEW   COMPARISON:  08/08/2006.   FINDINGS: Stable lung volumes, within normal limits. Stable cardiac size at the upper limits of normal. Other mediastinal contours are within normal limits. Visualized tracheal air column is within normal limits. Lung parenchyma stable and clear. No pneumothorax or effusion. No acute osseous abnormality identified.   IMPRESSION: No acute cardiopulmonary abnormality   ASSESSMENT/PLAN:  Right knee osteoarthritis-status post total knee arthroplasty. Continue rehabilitation. Hypertension-well-controlled Hyperlipidemia-continue Lipitor Constipation-continue current laxatives.  I have reviewed patient's medical records received at admission/from hospitalization.  CPT CODE: 88325  Zynia Wojtowicz Y Koston Hennes, [place] [phone]

## 2014-06-02 NOTE — Progress Notes (Signed)
Patient ID: SHALISSA EASTERWOOD, female   DOB: 06/21/1951, 63 y.o.   MRN: [id number]              PROGRESS NOTE  DATE:  06/02/14  FACILITY: Nursing Home Location: [facility]  LEVEL OF CARE: SNF (31)  Acute Visit  CHIEF COMPLAINT:  Discharge Notes  HISTORY OF PRESENT ILLNESS: This is a 63 year old female who is for discharge home with Home health PT and OT. She has been admitted to [facility] on 05/26/14 from [facility] with Osteoarthritis S/P Right total knee arthroplasty. Patient was admitted to this facility for short-term rehabilitation after the patient's recent hospitalization.  Patient has completed SNF rehabilitation and therapy has cleared the patient for discharge.   REASSESSMENT OF ONGOING PROBLEM(S):  HTN: Pt 's HTN remains stable.  Denies CP, sob, DOE, pedal edema, headaches, dizziness or visual disturbances.  No complications from the medications currently being used.  Last BP : 115/64  ANEMIA: The anemia has been stable. The patient denies fatigue, melena or hematochezia. No  medications currently being used. 8/15 hgb 8.9  HYPERLIPIDEMIA: No complications from the medications presently being used. Last fasting lipid panel showed : is not available   PAST MEDICAL HISTORY : Reviewed.  No changes/see problem list  CURRENT MEDICATIONS: Reviewed per MAR/see medication list  REVIEW OF SYSTEMS:  GENERAL: no change in appetite, no fatigue, no weight changes, no fever, chills or weakness RESPIRATORY: no cough, SOB, DOE, wheezing, hemoptysis CARDIAC: no chest pain, edema or palpitations GI: no abdominal pain, diarrhea, constipation, heart burn, nausea or vomiting  PHYSICAL EXAMINATION  GENERAL: no acute distress, morbidly obese NECK: supple, trachea midline, no neck masses, no thyroid tenderness, no thyromegaly LYMPHATICS: no LAN in the neck, no supraclavicular LAN RESPIRATORY: breathing is even & unlabored, BS CTAB CARDIAC: RRR, no murmur,no  extra heart sounds, no edema GI: abdomen soft, normal BS, no masses, no tenderness, no hepatomegaly, no splenomegaly EXTREMITIES:  Able to move all 4 extremities PSYCHIATRIC: the patient is alert & oriented to person, affect & behavior appropriate  LABS/RADIOLOGY: 05/31/14  WBC 9.5 hemoglobin 8.9 hematocrit 27.5 sodium 133 potassium 3.9 glucose 143 BUN 15 creatinine 0.7 calcium 8.6 Labs reviewed: Basic Metabolic Panel:  Recent Labs  05/12/14 1020 05/25/14 0419 05/26/14 0422  NA 143 138 135*  K 4.0 4.3 4.3  CL 105 102 98  CO2 23 24 24   GLUCOSE 136* 204* 216*  BUN 18 10 12   CREATININE 0.71 0.64 0.61  CALCIUM 10.3 9.0 9.2   Liver Function Tests:  Recent Labs  05/12/14 1020  AST 59*  ALT 82*  ALKPHOS 89  BILITOT 0.9  PROT 7.3  ALBUMIN 4.1   CBC:  Recent Labs  05/12/14 1020 05/25/14 0419 05/26/14 0422  WBC 5.4 12.2* 11.4*  HGB 17.0* 12.9 11.4*  HCT 47.3* 35.8* 32.4*  MCV 92.4 91.3 91.8  PLT 180 171 184      ASSESSMENT/PLAN:  Osteoarthritis status post right total knee arthroplasty - for Home health PT and OT Hyperlipidemia - continue Lipitor Hypertension - well controlled; continue Zestoretic and Toprol XL Hyponatremia - stable Anemia, acute blood loss - stable Constipation - continue Colace and MiraLax   I have filled out patient's discharge paperwork and written prescriptions.  Patient will receive home health PT and OT.  Total discharge time: Less than 30 minutes  Discharge time involved coordination of the discharge process with Education officer, museum, nursing staff and therapy department. Medical justification for  home health services verified.   CPT CODE: 65681  Seth Bake - NP [facility] [phone]

## 2015-03-25 ENCOUNTER — Encounter: Payer: Self-pay | Admitting: Cardiology

## 2015-03-25 ENCOUNTER — Ambulatory Visit (INDEPENDENT_AMBULATORY_CARE_PROVIDER_SITE_OTHER): Payer: BC Managed Care – PPO | Admitting: Cardiology

## 2015-03-25 VITALS — BP 122/87 | HR 92 | Ht 65.0 in | Wt 255.0 lb

## 2015-03-25 DIAGNOSIS — I10 Essential (primary) hypertension: Secondary | ICD-10-CM | POA: Diagnosis not present

## 2015-03-25 DIAGNOSIS — R002 Palpitations: Secondary | ICD-10-CM | POA: Insufficient documentation

## 2015-03-25 NOTE — Patient Instructions (Signed)
Your physician recommends that you schedule a follow-up appointment in: as needed with dr. Percival Spanish

## 2015-03-25 NOTE — Progress Notes (Signed)
Cardiology Office Note   Date:  03/25/2015   ID:  Bellina, Tokarczyk 1951-03-04, MRN [id number]  PCP:  Jonathon Bellows, MD  Cardiologist:   Minus Breeding, MD   Chief Complaint  Patient presents with  . Palpitations      History of Present Illness: Morgan Frye is a 64 y.o. female who presents for evaluation of palpitations. I saw her about 10 years ago for this although I don't have these records. She was treated with beta blockers. She did well over the years. She knows that she missed this she would feel palpitations so she was careful about taking her dose. She recently has been very busy on her job and has had some increased sense of anxiety in her chest. She might feel some palpitations. However, this seems to have been short-lived. She's not had any presyncope or syncope. She had knee surgery recently and is going to have the other knee done. She's not been doing as much water aerobics as she was doing. 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 She does not have any chest pressure, neck or arm discomfort. She's had no weight gain or edema.   Past Medical History  Diagnosis Date  . Hypertension   . History of kidney stones     x1 lithotripsy-has others not a bother at this time  . GERD (gastroesophageal reflux disease)     mild- uses Tums as needed  . Arthritis     osteoarthritis- knees, hips, rt. hip bursitis  . Transfusion history     '91- s/p childbirth  . Hemangioma of liver     very small- evaluated and considered stable- no further problems or follow up in many years  . History of palpitations     none since on Metoprolol.    Past Surgical History  Procedure Laterality Date  . Dilation and curettage of uterus    . Tonsillectomy      child  . Cataract extraction, bilateral Bilateral     5 yrs ago  . Lithotripsy    . Total knee arthroplasty Right 05/24/2014    Procedure: RIGHT TOTAL KNEE  ARTHROPLASTY;  Surgeon: Gearlean Alf, MD;  Location: [facility];  Service: Orthopedics;  Laterality: Right;     Current Outpatient Prescriptions  Medication Sig Dispense Refill  . atorvastatin (LIPITOR) 20 MG tablet Take 20 mg by mouth daily.    Marland Kitchen lisinopril-hydrochlorothiazide (PRINZIDE,ZESTORETIC) 20-12.5 MG per tablet Take 1 tablet by mouth every morning.    . metoprolol succinate (TOPROL-XL) 50 MG 24 hr tablet Take 50 mg by mouth every evening. Take with or immediately following a meal.     No current facility-administered medications for this visit.    Allergies:   Penicillins    Social History:  The patient  reports that she has never smoked. She does not have any smokeless tobacco history on file. She reports that she drinks alcohol. She reports that she does not use illicit drugs.   Family History:  The patient's family history includes Heart disease in her father; Liver disease in her mother.    ROS:  Please see the history of present illness.   Otherwise, review of systems are positive for none.   All other systems are reviewed and negative.    PHYSICAL EXAM: VS:  BP 122/87 mmHg  Pulse 92  Ht 5\' 5"  (1.651 m)  Wt 255 lb (  115.667 kg)  BMI 42.43 kg/m2 , BMI Body mass index is 42.43 kg/(m^2). GENERAL:  Well appearing HEENT:  Pupils equal round and reactive, fundi not visualized, oral mucosa unremarkable NECK:  No jugular venous distention, waveform within normal limits, carotid upstroke brisk and symmetric, no bruits, no thyromegaly LYMPHATICS:  No cervical, inguinal adenopathy LUNGS:  Clear to auscultation bilaterally BACK:  No CVA tenderness CHEST:  Unremarkable HEART:  PMI not displaced or sustained,S1 and S2 within normal limits, no S3, no S4, no clicks, no rubs, no murmurs ABD:  Flat, positive bowel sounds normal in frequency in pitch, no bruits, no rebound, no guarding, no midline pulsatile mass, no hepatomegaly, no splenomegaly EXT:  2 plus pulses throughout, no  edema, no cyanosis no clubbing SKIN:  No rashes no nodules NEURO:  Cranial nerves II through XII grossly intact, motor grossly intact throughout PSYCH:  Cognitively intact, oriented to person place and time    EKG:  EKG is ordered today. The ekg ordered today demonstrates sinus rhythm, rate 92, axis within normal limits, intervals within normal limits, left atrial enlargement possibly, no acute ST-T wave changes.   Recent Labs: 05/12/2014: ALT 82* 05/26/2014: BUN 12; Creatinine 0.61; Hemoglobin 11.4*; Platelets 184; Potassium 4.3; Sodium 135*    Lipid Panel No results found for: CHOL, TRIG, HDL, CHOLHDL, VLDL, LDLCALC, LDLDIRECT    Wt Readings from Last 3 Encounters:  03/25/15 255 lb (115.667 kg)  06/02/14 263 lb 3.2 oz (119.387 kg)  05/28/14 259 lb (117.482 kg)      Other studies Reviewed: Additional studies/ records that were reviewed today include: Outside labs. Review of the above records demonstrates:  Please see elsewhere in the note.     ASSESSMENT AND PLAN:  PALPITATIONS:  These are mild and fairly well controlled with beta blockers. Her exam is unremarkable. Her EKG is unremarkable. I did review labs and her TSH she says is normal for her recent electrolytes were normal. At this point I don't think further change in therapy or workup is indicated and we had a long discussion about this. However, should she would need to see me back to discuss this.   PREOP:  The patient would be at acceptable risk for the planned procedure without further cardiovascular testing.   Current medicines are reviewed at length with the patient today.  The patient does not have concerns regarding medicines.  The following changes have been made:  no change  Labs/ tests ordered today include:  Orders Placed This Encounter  Procedures  . EKG 12-Lead     Disposition:   FU with me as needed.     Signed, Minus Breeding, MD  03/25/2015 3:07 PM    [facility]

## 2015-03-31 ENCOUNTER — Ambulatory Visit: Payer: BC Managed Care – PPO | Admitting: Internal Medicine

## 2015-05-06 ENCOUNTER — Ambulatory Visit: Payer: Self-pay | Admitting: Orthopedic Surgery

## 2015-05-19 NOTE — Patient Instructions (Addendum)
Morgan Frye  05/19/2015   Your procedure is scheduled on:    05/30/2015    Report to [facility] Main  Entrance take [place]  elevators to 3rd floor to  [place] at     [place] AM.  Call this number if you have problems the morning of surgery [phone]   Remember: ONLY 1 PERSON MAY GO WITH YOU TO SHORT STAY TO GET  READY MORNING OF [place].  Do not eat food or drink liquids :After Midnight.     Take these medicines the morning of surgery with A SIP OF WATER: none                                You may not have any metal on your body including hair pins and              piercings  Do not wear jewelry, make-up, lotions, powders or perfumes, deodorant             Do not wear nail polish.  Do not shave  48 hours prior to surgery.                 Do not bring valuables to the hospital. [place].  Contacts, dentures or bridgework may not be worn into surgery.  Leave suitcase in the car. After surgery it may be brought to your room.         Special Instructions: coughing and deep breathing exercises, leg exercises               Please read over the following fact sheets you were given: _____________________________________________________________________             [facility] - Preparing for Surgery Before surgery, you can play an important role.  Because skin is not sterile, your skin needs to be as free of germs as possible.  You can reduce the number of germs on your skin by washing with CHG (chlorahexidine gluconate) soap before surgery.  CHG is an antiseptic cleaner which kills germs and bonds with the skin to continue killing germs even after washing. Please DO NOT use if you have an allergy to CHG or antibacterial soaps.  If your skin becomes reddened/irritated stop using the CHG and inform your nurse when you arrive at Short Stay. Do not shave (including legs and underarms) for at least 48  hours prior to the first CHG shower.  You may shave your face/neck. Please follow these instructions carefully:  1.  Shower with CHG Soap the night before surgery and the  morning of Surgery.  2.  If you choose to wash your hair, wash your hair first as usual with your  normal  shampoo.  3.  After you shampoo, rinse your hair and body thoroughly to remove the  shampoo.                           4.  Use CHG as you would any other liquid soap.  You can apply chg directly  to the skin and wash  Gently with a scrungie or clean washcloth.  5.  Apply the CHG Soap to your body ONLY FROM THE NECK DOWN.   Do not use on face/ open                           Wound or open sores. Avoid contact with eyes, ears mouth and genitals (private parts).                       Wash face,  Genitals (private parts) with your normal soap.             6.  Wash thoroughly, paying special attention to the area where your surgery  will be performed.  7.  Thoroughly rinse your body with warm water from the neck down.  8.  DO NOT shower/wash with your normal soap after using and rinsing off  the CHG Soap.                9.  Pat yourself dry with a clean towel.            10.  Wear clean pajamas.            11.  Place clean sheets on your bed the night of your first shower and do not  sleep with pets. Day of Surgery : Do not apply any lotions/deodorants the morning of surgery.  Please wear clean clothes to the hospital/surgery center.  FAILURE TO FOLLOW THESE INSTRUCTIONS MAY RESULT IN THE CANCELLATION OF YOUR SURGERY PATIENT SIGNATURE_________________________________  NURSE SIGNATURE__________________________________  ________________________________________________________________________  WHAT IS A BLOOD TRANSFUSION? Blood Transfusion Information  A transfusion is the replacement of blood or some of its parts. Blood is made up of multiple cells which provide different functions.  Red blood cells  carry oxygen and are used for blood loss replacement.  White blood cells fight against infection.  Platelets control bleeding.  Plasma helps clot blood.  Other blood products are available for specialized needs, such as hemophilia or other clotting disorders. BEFORE THE TRANSFUSION  Who gives blood for transfusions?   Healthy volunteers who are fully evaluated to make sure their blood is safe. This is blood bank blood. Transfusion therapy is the safest it has ever been in the practice of medicine. Before blood is taken from a donor, a complete history is taken to make sure that person has no history of diseases nor engages in risky social behavior (examples are intravenous drug use or sexual activity with multiple partners). The donor's travel history is screened to minimize risk of transmitting infections, such as malaria. The donated blood is tested for signs of infectious diseases, such as HIV and hepatitis. The blood is then tested to be sure it is compatible with you in order to minimize the chance of a transfusion reaction. If you or a relative donates blood, this is often done in anticipation of surgery and is not appropriate for emergency situations. It takes many days to process the donated blood. RISKS AND COMPLICATIONS Although transfusion therapy is very safe and saves many lives, the main dangers of transfusion include:  1. Getting an infectious disease. 2. Developing a transfusion reaction. This is an allergic reaction to something in the blood you were given. Every precaution is taken to prevent this. The decision to have a blood transfusion has been considered carefully by your caregiver before blood is given. Blood is not given unless the benefits outweigh  the risks. AFTER THE TRANSFUSION  Right after receiving a blood transfusion, you will usually feel much better and more energetic. This is especially true if your red blood cells have gotten low (anemic). The transfusion  raises the level of the red blood cells which carry oxygen, and this usually causes an energy increase.  The nurse administering the transfusion will monitor you carefully for complications. HOME CARE INSTRUCTIONS  No special instructions are needed after a transfusion. You may find your energy is better. Speak with your caregiver about any limitations on activity for underlying diseases you may have. SEEK MEDICAL CARE IF:   Your condition is not improving after your transfusion.  You develop redness or irritation at the intravenous (IV) site. SEEK IMMEDIATE MEDICAL CARE IF:  Any of the following symptoms occur over the next 12 hours:  Shaking chills.  You have a temperature by mouth above 102 F (38.9 C), not controlled by medicine.  Chest, back, or muscle pain.  People around you feel you are not acting correctly or are confused.  Shortness of breath or difficulty breathing.  Dizziness and fainting.  You get a rash or develop hives.  You have a decrease in urine output.  Your urine turns a dark color or changes to pink, red, or brown. Any of the following symptoms occur over the next 10 days:  You have a temperature by mouth above 102 F (38.9 C), not controlled by medicine.  Shortness of breath.  Weakness after normal activity.  The white part of the eye turns yellow (jaundice).  You have a decrease in the amount of urine or are urinating less often.  Your urine turns a dark color or changes to pink, red, or brown. Document Released: 10/12/2000 Document Revised: 01/07/2012 Document Reviewed: 05/31/2008 ExitCare Patient Information 2014 [place].  _______________________________________________________________________  Incentive Spirometer  An incentive spirometer is a tool that can help keep your lungs clear and active. This tool measures how well you are filling your lungs with each breath. Taking long deep breaths may help reverse or decrease the chance  of developing breathing (pulmonary) problems (especially infection) following:  A long period of time when you are unable to move or be active. BEFORE THE PROCEDURE   If the spirometer includes an indicator to show your best effort, your nurse or respiratory therapist will set it to a desired goal.  If possible, sit up straight or lean slightly forward. Try not to slouch.  Hold the incentive spirometer in an upright position. INSTRUCTIONS FOR USE  3. Sit on the edge of your bed if possible, or sit up as far as you can in bed or on a chair. 4. Hold the incentive spirometer in an upright position. 5. Breathe out normally. 6. Place the mouthpiece in your mouth and seal your lips tightly around it. 7. Breathe in slowly and as deeply as possible, raising the piston or the ball toward the top of the column. 8. Hold your breath for 3-5 seconds or for as long as possible. Allow the piston or ball to fall to the bottom of the column. 9. Remove the mouthpiece from your mouth and breathe out normally. 10. Rest for a few seconds and repeat Steps 1 through 7 at least 10 times every 1-2 hours when you are awake. Take your time and take a few normal breaths between deep breaths. 11. The spirometer may include an indicator to show your best effort. Use the indicator as a goal to work  toward during each repetition. 12. After each set of 10 deep breaths, practice coughing to be sure your lungs are clear. If you have an incision (the cut made at the time of surgery), support your incision when coughing by placing a pillow or rolled up towels firmly against it. Once you are able to get out of bed, walk around indoors and cough well. You may stop using the incentive spirometer when instructed by your caregiver.  RISKS AND COMPLICATIONS  Take your time so you do not get dizzy or light-headed.  If you are in pain, you may need to take or ask for pain medication before doing incentive spirometry. It is harder to  take a deep breath if you are having pain. AFTER USE  Rest and breathe slowly and easily.  It can be helpful to keep track of a log of your progress. Your caregiver can provide you with a simple table to help with this. If you are using the spirometer at home, follow these instructions: [place] IF:   You are having difficultly using the spirometer.  You have trouble using the spirometer as often as instructed.  Your pain medication is not giving enough relief while using the spirometer.  You develop fever of 100.5 F (38.1 C) or higher. SEEK IMMEDIATE MEDICAL CARE IF:   You cough up bloody sputum that had not been present before.  You develop fever of 102 F (38.9 C) or greater.  You develop worsening pain at or near the incision site. MAKE SURE YOU:   Understand these instructions.  Will watch your condition.  Will get help right away if you are not doing well or get worse. Document Released: 02/25/2007 Document Revised: 01/07/2012 Document Reviewed: 04/28/2007 [facility] Patient Information 2014 [place], [place].   ________________________________________________________________________

## 2015-05-20 ENCOUNTER — Encounter (HOSPITAL_COMMUNITY)
Admission: RE | Admit: 2015-05-20 | Discharge: 2015-05-20 | Disposition: A | Discharge status: Home / Self Care | Payer: BC Managed Care – PPO | Source: Ambulatory Visit | Attending: Orthopedic Surgery | Admitting: Orthopedic Surgery

## 2015-05-20 ENCOUNTER — Encounter (HOSPITAL_COMMUNITY): Payer: Self-pay

## 2015-05-20 DIAGNOSIS — M179 Osteoarthritis of knee, unspecified: Secondary | ICD-10-CM | POA: Insufficient documentation

## 2015-05-20 DIAGNOSIS — Z01812 Encounter for preprocedural laboratory examination: Secondary | ICD-10-CM | POA: Insufficient documentation

## 2015-05-20 LAB — URINALYSIS, ROUTINE W REFLEX MICROSCOPIC
Glucose, UA: NEGATIVE mg/dL
HGB URINE DIPSTICK: NEGATIVE
KETONES UR: NEGATIVE mg/dL
NITRITE: NEGATIVE
PH: 5.5 (ref 5.0–8.0)
Protein, ur: NEGATIVE mg/dL
Specific Gravity, Urine: 1.021 (ref 1.005–1.030)
Urobilinogen, UA: 0.2 mg/dL (ref 0.0–1.0)

## 2015-05-20 LAB — COMPREHENSIVE METABOLIC PANEL
ALBUMIN: 4.3 g/dL (ref 3.5–5.0)
ALT: 45 U/L (ref 14–54)
AST: 36 U/L (ref 15–41)
Alkaline Phosphatase: 75 U/L (ref 38–126)
Anion gap: 9 (ref 5–15)
BILIRUBIN TOTAL: 1.1 mg/dL (ref 0.3–1.2)
BUN: 19 mg/dL (ref 6–20)
CO2: 26 mmol/L (ref 22–32)
Calcium: 9.9 mg/dL (ref 8.9–10.3)
Chloride: 105 mmol/L (ref 101–111)
Creatinine, Ser: 0.81 mg/dL (ref 0.44–1.00)
GFR calc Af Amer: >60 mL/min (ref >60)
Glucose, Bld: 183 mg/dL — ABNORMAL HIGH (ref 65–99)
Potassium: 3.9 mmol/L (ref 3.5–5.1)
Sodium: 140 mmol/L (ref 135–145)
Total Protein: 7.2 g/dL (ref 6.5–8.1)

## 2015-05-20 LAB — CBC
HCT: 47.9 % — ABNORMAL HIGH (ref 36.0–46.0)
Hemoglobin: 16.7 g/dL — ABNORMAL HIGH (ref 12.0–15.0)
MCH: 32.7 pg (ref 26.0–34.0)
MCHC: 34.9 g/dL (ref 30.0–36.0)
MCV: 93.9 fL (ref 78.0–100.0)
PLATELETS: 192 10*3/uL (ref 150–400)
RBC: 5.1 MIL/uL (ref 3.87–5.11)
RDW: 12.8 % (ref 11.5–15.5)
WBC: 5 10*3/uL (ref 4.0–10.5)

## 2015-05-20 LAB — URINE MICROSCOPIC-ADD ON

## 2015-05-20 LAB — SURGICAL PCR SCREEN
MRSA, PCR: NEGATIVE
Staphylococcus aureus: NEGATIVE

## 2015-05-20 LAB — PROTIME-INR
INR: 1.32 (ref 0.00–1.49)
Prothrombin Time: 16.6 seconds — ABNORMAL HIGH (ref 11.6–15.2)

## 2015-05-20 LAB — APTT: aPTT: 29 seconds (ref 24–37)

## 2015-05-20 NOTE — Progress Notes (Signed)
U/A and micro results along with PT results done 05/20/15 faxed via EPIC to Dr Wynelle Link.

## 2015-05-20 NOTE — Progress Notes (Signed)
EKG 03/25/2015  EPIC  LOV with Dr Percival Spanish - 03/25/15 - EPIC  Clearance - DR Maurice Small- 01/25/15 on chart

## 2015-05-29 ENCOUNTER — Ambulatory Visit: Payer: Self-pay | Admitting: Orthopedic Surgery

## 2015-05-29 MED ORDER — SODIUM CHLORIDE 0.9 % IV SOLN
1500.0000 mg | INTRAVENOUS | Status: AC
  Administered 2015-05-30: 1500 mg via INTRAVENOUS
  Filled 2015-05-29: qty 1500

## 2015-05-29 NOTE — H&P (Signed)
Morgan Frye DOB: June 09, 1951 Married / Language: English / Race: White Female Date of Admission:  05/30/2015 CC:  Left Knee Pain History of Present Illness The patient is a 64 year old female who comes in for a preoperative History and Physical. The patient is scheduled for a left total knee arthroplasty to be performed by Dr. Dione Plover. Aluisio, MD at [facility] on 05-30-2015. The patient is a 64 year old female who presents for follow up of their knee. The patient is being followed for their left knee pain and osteoarthritis. They are now several months out from cortisone injection. Symptoms reported include: pain and aching. The patient feels that they got some temporary relief which was mild to moderate. The patient has reported improvement of their symptoms with: Cortisone injections. She wants to have her knee replaced. She is now almost 7 months out from her right total knee, the right knee is doing well. She states the left knee is as bad as the right knee was prior to when she had that replaced. She has done great with the right knee and is very happy with that. She is at a stage where she wants to get the left knee fixed now. She is admitted for total knee replacement on the left. They have been treated conservatively in the past for the above stated problem and despite conservative measures, they continue to have progressive pain and severe functional limitations and dysfunction. They have failed non-operative management including home exercise, medications, and injections. It is felt that they would benefit from undergoing total joint replacement. Risks and benefits of the procedure have been discussed with the patient and they elect to proceed with surgery. There are no active contraindications to surgery such as ongoing infection or rapidly progressive neurological disease.  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 Childhood RXN  Family History Hypertension Father, Mother, Paternal Grandmother. Osteoarthritis Mother. Liver Disease, Chronic Maternal Grandmother, Mother. Rheumatoid Arthritis Father. Cerebrovascular Accident Paternal Grandmother. Cancer Father.  Social History No history of drug/alcohol rehab Tobacco / smoke exposure 11/11/2013: no Not under pain contract Tobacco use Never smoker. 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  Medication History Clindamycin HCl (300MG  Capsule, 2 (two) Capsule Oral 2 tablets 1-2h prior to [facility], Taken starting 08/10/2014) Active. Ciclopirox Olamine (0.77% Cream, External) Active. Lisinopril-Hydrochlorothiazide (20-12.5MG  Tablet, Oral) Active. Etodolac ER (400MG  Tablet ER 24HR, Oral) Active. Toprol XL (50MG  Tablet ER, Oral) Active. Lipitor (20MG  Tablet, Oral) Active. Glucosamine Chondroitin Complx (Oral) Active. Multivitamin (Oral) Active. Calcium Carbonate (600MG  Tablet, Oral) Active. Omega 3 (Oral) Specific dose unknown - Active. Vitamin D (400UNIT Tablet, Oral) Active. Aspirin EC (325MG  Tablet DR, Oral) Active.  Past Surgical History Dilation and Curettage of Uterus Cataract Surgery bilateral Tonsillectomy Total Knee Replacement - Right   Review of  Systems General Not Present- Chills, Fatigue, Fever, Memory Loss, Night Sweats, Weight Gain and Weight Loss. Skin Not Present- Eczema, Hives, Itching, Lesions and Rash. HEENT Not Present- Dentures,  Double Vision, Headache, Hearing Loss, Tinnitus and Visual Loss. Respiratory Not Present- Allergies, Chronic Cough, Coughing up blood, Shortness of breath at rest and Shortness of breath with exertion. Cardiovascular Not Present- Chest Pain, Difficulty Breathing Lying Down, Murmur, Palpitations, Racing/skipping heartbeats and Swelling. Gastrointestinal Not Present- Abdominal Pain, Bloody Stool, Constipation, Diarrhea, Difficulty Swallowing, Heartburn, Jaundice, Loss of appetitie, Nausea and Vomiting. Female Genitourinary Not Present- Blood in Urine, Discharge, Flank Pain, Incontinence, Painful Urination, Urgency, Urinary frequency, Urinary Retention, Urinating at Night and Weak urinary stream. Musculoskeletal Present- Joint Pain and Morning Stiffness. Not Present- Back Pain, Joint Swelling, Muscle Pain, Muscle Weakness and Spasms. Neurological Not Present- Blackout spells, Difficulty with balance, Dizziness, Paralysis, Tremor and Weakness. Psychiatric Not Present- Insomnia.   Vitals Weight: 245 lb Height: 66in Weight was reported by patient. Height was reported by patient. Body Surface Area: 2.18 m Body Mass Index: 39.54 kg/m  BP: 128/78 (Sitting, Right Arm, Standard)  Physical Exam General Mental Status -Alert, cooperative and good historian. General Appearance-pleasant, Not in acute distress. Orientation-Oriented X3. Build & Nutrition-Well nourished and Well developed.  Head and Neck Head-normocephalic, atraumatic . Neck Global Assessment - supple, no bruit auscultated on the right, no bruit auscultated on the left.  Eye Vision-Wears corrective lenses. Pupil - Bilateral-Regular and Round. Motion - Bilateral-EOMI.  Chest and Lung Exam Auscultation Breath  sounds - clear at anterior chest wall and clear at posterior chest wall. Adventitious sounds - No Adventitious sounds.  Cardiovascular Auscultation Rhythm - Regular rate and rhythm. Heart Sounds - S1 WNL and S2 WNL. Murmurs & Other Heart Sounds - Auscultation of the heart reveals - No Murmurs.  Abdomen Inspection Contour - Generalized moderate distention. Palpation/Percussion Tenderness - Abdomen is non-tender to palpation. Rigidity (guarding) - Abdomen is soft. Auscultation Auscultation of the abdomen reveals - Bowel sounds normal.  Female Genitourinary Note: Not done, not pertinent to present illness   Musculoskeletal Note: She is alert and oriented, no apparent distress. Her right knee looks excellent. There is no swelling. Range 0 to 120 with no tenderness or instability. Left knee about 5 to 125. Moderate crepitus on range of motion. Tender medial and lateral with no instability noted.  RADIOGRAPHS: We reviewed radiographs from last visit. The prosthesis on the right is in excellent position. No periprosthetic abnormalities. ON the left she is bone on bone medial and patellofemoral large osteophytes.   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 Maurice Small, [facility] - Patient has been seen preoperatively and felt to be stable for surgery.  IV TXA  Anesthesia Issues: NONE  Signed electronically by Ok Edwards, III PA-C

## 2015-05-30 ENCOUNTER — Inpatient Hospital Stay (HOSPITAL_COMMUNITY)
Admission: RE | Admit: 2015-05-30 | Discharge: 2015-06-01 | DRG: 470 | Disposition: A | Discharge status: Home Health | Payer: BC Managed Care – PPO | Source: Ambulatory Visit | Attending: Orthopedic Surgery | Admitting: Orthopedic Surgery

## 2015-05-30 ENCOUNTER — Inpatient Hospital Stay (HOSPITAL_COMMUNITY): Payer: BC Managed Care – PPO | Admitting: Anesthesiology

## 2015-05-30 ENCOUNTER — Encounter (HOSPITAL_COMMUNITY): Payer: Self-pay

## 2015-05-30 ENCOUNTER — Encounter (HOSPITAL_COMMUNITY)
Admission: RE | Disposition: A | Discharge status: Home Health | Payer: Self-pay | Source: Ambulatory Visit | Attending: Orthopedic Surgery

## 2015-05-30 DIAGNOSIS — Z8261 Family history of arthritis: Secondary | ICD-10-CM

## 2015-05-30 DIAGNOSIS — K219 Gastro-esophageal reflux disease without esophagitis: Secondary | ICD-10-CM | POA: Diagnosis present

## 2015-05-30 DIAGNOSIS — Z79899 Other long term (current) drug therapy: Secondary | ICD-10-CM

## 2015-05-30 DIAGNOSIS — Z8249 Family history of ischemic heart disease and other diseases of the circulatory system: Secondary | ICD-10-CM

## 2015-05-30 DIAGNOSIS — I10 Essential (primary) hypertension: Secondary | ICD-10-CM | POA: Diagnosis present

## 2015-05-30 DIAGNOSIS — M25562 Pain in left knee: Secondary | ICD-10-CM | POA: Diagnosis present

## 2015-05-30 DIAGNOSIS — Z01812 Encounter for preprocedural laboratory examination: Secondary | ICD-10-CM | POA: Diagnosis not present

## 2015-05-30 DIAGNOSIS — E780 Pure hypercholesterolemia: Secondary | ICD-10-CM | POA: Diagnosis present

## 2015-05-30 DIAGNOSIS — M179 Osteoarthritis of knee, unspecified: Secondary | ICD-10-CM | POA: Diagnosis present

## 2015-05-30 DIAGNOSIS — Z6841 Body Mass Index (BMI) 40.0 and over, adult: Secondary | ICD-10-CM | POA: Diagnosis not present

## 2015-05-30 DIAGNOSIS — M1710 Unilateral primary osteoarthritis, unspecified knee: Secondary | ICD-10-CM | POA: Diagnosis present

## 2015-05-30 DIAGNOSIS — M1712 Unilateral primary osteoarthritis, left knee: Principal | ICD-10-CM | POA: Diagnosis present

## 2015-05-30 HISTORY — PX: TOTAL KNEE ARTHROPLASTY: SHX125

## 2015-05-30 LAB — TYPE AND SCREEN
ABO/RH(D): A POS
Antibody Screen: NEGATIVE

## 2015-05-30 SURGERY — ARTHROPLASTY, KNEE, TOTAL
Anesthesia: Monitor Anesthesia Care | Site: Knee | Laterality: Left

## 2015-05-30 MED ORDER — LACTATED RINGERS IV SOLN
INTRAVENOUS | Status: DC
  Administered 2015-05-30 (×2): via INTRAVENOUS

## 2015-05-30 MED ORDER — PROPOFOL INFUSION 10 MG/ML OPTIME
INTRAVENOUS | Status: DC | PRN
  Administered 2015-05-30: 100 ug/kg/min via INTRAVENOUS

## 2015-05-30 MED ORDER — RIVAROXABAN 10 MG PO TABS
10.0000 mg | ORAL_TABLET | Freq: Every day | ORAL | Status: DC
  Administered 2015-05-31 – 2015-06-01 (×2): 10 mg via ORAL
  Filled 2015-05-30 (×3): qty 1

## 2015-05-30 MED ORDER — DIPHENHYDRAMINE HCL 12.5 MG/5ML PO ELIX: 12.5000 mg | ORAL_SOLUTION | ORAL | Status: DC | PRN

## 2015-05-30 MED ORDER — ACETAMINOPHEN 325 MG PO TABS
650.0000 mg | ORAL_TABLET | Freq: Four times a day (QID) | ORAL | Status: DC | PRN
  Administered 2015-06-01: 650 mg via ORAL
  Filled 2015-05-30: qty 2

## 2015-05-30 MED ORDER — POTASSIUM CHLORIDE IN NACL 20-0.9 MEQ/L-% IV SOLN
INTRAVENOUS | Status: DC
  Administered 2015-05-30: 16:00:00 via INTRAVENOUS
  Filled 2015-05-30 (×2): qty 1000

## 2015-05-30 MED ORDER — KETOROLAC TROMETHAMINE 15 MG/ML IJ SOLN: 7.5000 mg | Freq: Four times a day (QID) | INTRAMUSCULAR | Status: AC | PRN

## 2015-05-30 MED ORDER — BUPIVACAINE HCL 0.25 % IJ SOLN
INTRAMUSCULAR | Status: DC | PRN
  Administered 2015-05-30: 20 mL

## 2015-05-30 MED ORDER — SODIUM CHLORIDE 0.9 % IR SOLN
Status: DC | PRN
  Administered 2015-05-30: 1000 mL

## 2015-05-30 MED ORDER — MIDAZOLAM HCL 5 MG/5ML IJ SOLN
INTRAMUSCULAR | Status: DC | PRN
  Administered 2015-05-30 (×4): 1 mg via INTRAVENOUS

## 2015-05-30 MED ORDER — BUPIVACAINE IN DEXTROSE 0.75-8.25 % IT SOLN
INTRATHECAL | Status: DC | PRN
  Administered 2015-05-30: 15 mg via INTRATHECAL

## 2015-05-30 MED ORDER — TRANEXAMIC ACID 1000 MG/10ML IV SOLN
1000.0000 mg | INTRAVENOUS | Status: AC
  Administered 2015-05-30: 1000 mg via INTRAVENOUS
  Filled 2015-05-30: qty 10

## 2015-05-30 MED ORDER — DEXAMETHASONE SODIUM PHOSPHATE 10 MG/ML IJ SOLN
10.0000 mg | Freq: Once | INTRAMUSCULAR | Status: AC
  Administered 2015-05-31: 10 mg via INTRAVENOUS
  Filled 2015-05-30 (×2): qty 1

## 2015-05-30 MED ORDER — DOCUSATE SODIUM 100 MG PO CAPS
100.0000 mg | ORAL_CAPSULE | Freq: Two times a day (BID) | ORAL | Status: DC
  Administered 2015-05-30 – 2015-06-01 (×4): 100 mg via ORAL

## 2015-05-30 MED ORDER — BUPIVACAINE LIPOSOME 1.3 % IJ SUSP
INTRAMUSCULAR | Status: DC | PRN
Start: 2015-05-30 — End: 2015-05-30
  Administered 2015-05-30: 20 mL

## 2015-05-30 MED ORDER — SODIUM CHLORIDE 0.9 % IJ SOLN
INTRAMUSCULAR | Status: AC
  Filled 2015-05-30: qty 10

## 2015-05-30 MED ORDER — METOPROLOL SUCCINATE ER 50 MG PO TB24
50.0000 mg | ORAL_TABLET | Freq: Every evening | ORAL | Status: DC
  Administered 2015-05-30 – 2015-05-31 (×2): 50 mg via ORAL
  Filled 2015-05-30 (×3): qty 1

## 2015-05-30 MED ORDER — PROPOFOL 10 MG/ML IV BOLUS
INTRAVENOUS | Status: AC
  Filled 2015-05-30: qty 20

## 2015-05-30 MED ORDER — ONDANSETRON HCL 4 MG/2ML IJ SOLN
INTRAMUSCULAR | Status: AC
  Filled 2015-05-30: qty 2

## 2015-05-30 MED ORDER — ACETAMINOPHEN 10 MG/ML IV SOLN
INTRAVENOUS | Status: AC
  Filled 2015-05-30: qty 100

## 2015-05-30 MED ORDER — BISACODYL 10 MG RE SUPP: 10.0000 mg | Freq: Every day | RECTAL | Status: DC | PRN

## 2015-05-30 MED ORDER — MORPHINE SULFATE 2 MG/ML IJ SOLN: 1.0000 mg | INTRAMUSCULAR | Status: DC | PRN

## 2015-05-30 MED ORDER — FENTANYL CITRATE (PF) 250 MCG/5ML IJ SOLN
INTRAMUSCULAR | Status: AC
  Filled 2015-05-30: qty 5

## 2015-05-30 MED ORDER — ONDANSETRON HCL 4 MG/2ML IJ SOLN: 4.0000 mg | Freq: Four times a day (QID) | INTRAMUSCULAR | Status: DC | PRN

## 2015-05-30 MED ORDER — ACETAMINOPHEN 650 MG RE SUPP: 650.0000 mg | Freq: Four times a day (QID) | RECTAL | Status: DC | PRN

## 2015-05-30 MED ORDER — BUPIVACAINE HCL (PF) 0.25 % IJ SOLN
INTRAMUSCULAR | Status: AC
  Filled 2015-05-30: qty 30

## 2015-05-30 MED ORDER — CHLORHEXIDINE GLUCONATE 4 % EX LIQD: 60.0000 mL | Freq: Once | CUTANEOUS | Status: DC

## 2015-05-30 MED ORDER — SODIUM CHLORIDE 0.9 % IJ SOLN
INTRAMUSCULAR | Status: DC | PRN
  Administered 2015-05-30: 30 mL

## 2015-05-30 MED ORDER — ONDANSETRON HCL 4 MG PO TABS: 4.0000 mg | ORAL_TABLET | Freq: Four times a day (QID) | ORAL | Status: DC | PRN

## 2015-05-30 MED ORDER — OXYCODONE HCL 5 MG PO TABS
5.0000 mg | ORAL_TABLET | ORAL | Status: DC | PRN
  Administered 2015-05-30: 5 mg via ORAL
  Administered 2015-05-30: 10 mg via ORAL
  Administered 2015-05-30: 5 mg via ORAL
  Administered 2015-05-30 – 2015-05-31 (×5): 10 mg via ORAL
  Administered 2015-05-31: 5 mg via ORAL
  Administered 2015-05-31: 10 mg via ORAL
  Administered 2015-05-31: 5 mg via ORAL
  Administered 2015-06-01 (×3): 10 mg via ORAL
  Filled 2015-05-30: qty 1
  Filled 2015-05-30 (×8): qty 2
  Filled 2015-05-30: qty 1
  Filled 2015-05-30 (×3): qty 2
  Filled 2015-05-30: qty 1

## 2015-05-30 MED ORDER — SODIUM CHLORIDE 0.9 % IV SOLN: INTRAVENOUS | Status: DC

## 2015-05-30 MED ORDER — PROPOFOL 500 MG/50ML IV EMUL
INTRAVENOUS | Status: DC | PRN
  Administered 2015-05-30: 150 mg via INTRAVENOUS
  Administered 2015-05-30: 30 mg via INTRAVENOUS

## 2015-05-30 MED ORDER — FLEET ENEMA 7-19 GM/118ML RE ENEM: 1.0000 | ENEMA | Freq: Once | RECTAL | Status: AC | PRN

## 2015-05-30 MED ORDER — MENTHOL 3 MG MT LOZG: 1.0000 | LOZENGE | OROMUCOSAL | Status: DC | PRN

## 2015-05-30 MED ORDER — EPHEDRINE SULFATE 50 MG/ML IJ SOLN
INTRAMUSCULAR | Status: AC
  Filled 2015-05-30: qty 1

## 2015-05-30 MED ORDER — SODIUM CHLORIDE 0.9 % IJ SOLN
INTRAMUSCULAR | Status: AC
  Filled 2015-05-30: qty 50

## 2015-05-30 MED ORDER — METOCLOPRAMIDE HCL 5 MG/ML IJ SOLN: 5.0000 mg | Freq: Three times a day (TID) | INTRAMUSCULAR | Status: DC | PRN

## 2015-05-30 MED ORDER — LIDOCAINE HCL (CARDIAC) 20 MG/ML IV SOLN
INTRAVENOUS | Status: AC
  Filled 2015-05-30: qty 5

## 2015-05-30 MED ORDER — FENTANYL CITRATE (PF) 100 MCG/2ML IJ SOLN
INTRAMUSCULAR | Status: DC | PRN
  Administered 2015-05-30 (×3): 50 ug via INTRAVENOUS

## 2015-05-30 MED ORDER — HYDROMORPHONE HCL 1 MG/ML IJ SOLN: 0.2500 mg | INTRAMUSCULAR | Status: DC | PRN

## 2015-05-30 MED ORDER — ACETAMINOPHEN 500 MG PO TABS
1000.0000 mg | ORAL_TABLET | Freq: Four times a day (QID) | ORAL | Status: AC
  Administered 2015-05-30 – 2015-05-31 (×4): 1000 mg via ORAL
  Filled 2015-05-30 (×4): qty 2

## 2015-05-30 MED ORDER — VANCOMYCIN HCL IN DEXTROSE 1-5 GM/200ML-% IV SOLN
1000.0000 mg | Freq: Two times a day (BID) | INTRAVENOUS | Status: AC
  Administered 2015-05-30: 1000 mg via INTRAVENOUS
  Filled 2015-05-30: qty 200

## 2015-05-30 MED ORDER — ACETAMINOPHEN 10 MG/ML IV SOLN
1000.0000 mg | Freq: Once | INTRAVENOUS | Status: AC
  Administered 2015-05-30: 1000 mg via INTRAVENOUS

## 2015-05-30 MED ORDER — BUPIVACAINE LIPOSOME 1.3 % IJ SUSP
20.0000 mL | Freq: Once | INTRAMUSCULAR | Status: DC
  Filled 2015-05-30: qty 20

## 2015-05-30 MED ORDER — PHENOL 1.4 % MT LIQD: 1.0000 | OROMUCOSAL | Status: DC | PRN

## 2015-05-30 MED ORDER — MIDAZOLAM HCL 2 MG/2ML IJ SOLN
INTRAMUSCULAR | Status: AC
  Filled 2015-05-30: qty 2

## 2015-05-30 MED ORDER — METOCLOPRAMIDE HCL 10 MG PO TABS: 5.0000 mg | ORAL_TABLET | Freq: Three times a day (TID) | ORAL | Status: DC | PRN

## 2015-05-30 MED ORDER — ONDANSETRON HCL 4 MG/2ML IJ SOLN
INTRAMUSCULAR | Status: DC | PRN
  Administered 2015-05-30: 4 mg via INTRAVENOUS

## 2015-05-30 MED ORDER — POLYETHYLENE GLYCOL 3350 17 G PO PACK: 17.0000 g | PACK | Freq: Every day | ORAL | Status: DC | PRN

## 2015-05-30 MED ORDER — DEXAMETHASONE SODIUM PHOSPHATE 10 MG/ML IJ SOLN
10.0000 mg | Freq: Once | INTRAMUSCULAR | Status: AC
  Administered 2015-05-30: 10 mg via INTRAVENOUS

## 2015-05-30 MED ORDER — METHOCARBAMOL 500 MG PO TABS
500.0000 mg | ORAL_TABLET | Freq: Four times a day (QID) | ORAL | Status: DC | PRN
  Administered 2015-05-31 – 2015-06-01 (×2): 500 mg via ORAL
  Filled 2015-05-30 (×3): qty 1

## 2015-05-30 MED ORDER — 0.9 % SODIUM CHLORIDE (POUR BTL) OPTIME
TOPICAL | Status: DC | PRN
  Administered 2015-05-30: 1000 mL

## 2015-05-30 MED ORDER — METHOCARBAMOL 1000 MG/10ML IJ SOLN
500.0000 mg | Freq: Four times a day (QID) | INTRAVENOUS | Status: DC | PRN
  Administered 2015-05-30: 500 mg via INTRAVENOUS
  Filled 2015-05-30 (×2): qty 5

## 2015-05-30 MED ORDER — ATORVASTATIN CALCIUM 20 MG PO TABS
20.0000 mg | ORAL_TABLET | Freq: Every day | ORAL | Status: DC
  Administered 2015-05-30 – 2015-05-31 (×2): 20 mg via ORAL
  Filled 2015-05-30 (×3): qty 1

## 2015-05-30 MED ORDER — TRAMADOL HCL 50 MG PO TABS: 50.0000 mg | ORAL_TABLET | Freq: Four times a day (QID) | ORAL | Status: DC | PRN

## 2015-05-30 SURGICAL SUPPLY — 67 items
BAG DECANTER FOR FLEXI CONT (MISCELLANEOUS) ×3 IMPLANT
BAG ZIPLOCK 12X15 (MISCELLANEOUS) ×3 IMPLANT
BANDAGE ELASTIC 6 VELCRO ST LF (GAUZE/BANDAGES/DRESSINGS) ×3 IMPLANT
BANDAGE ESMARK 6X9 LF (GAUZE/BANDAGES/DRESSINGS) ×1 IMPLANT
BLADE SAG 18X100X1.27 (BLADE) ×3 IMPLANT
BLADE SAW SGTL 11.0X1.19X90.0M (BLADE) ×3 IMPLANT
BNDG ESMARK 6X9 LF (GAUZE/BANDAGES/DRESSINGS) ×3
BOWL SMART MIX CTS (DISPOSABLE) ×3 IMPLANT
CAPT KNEE TOTAL 3 ATTUNE ×3 IMPLANT
CEMENT HV SMART SET (Cement) ×6 IMPLANT
CLOSURE WOUND 1/2 X4 (GAUZE/BANDAGES/DRESSINGS) ×1
CUFF TOURN SGL QUICK 34 (TOURNIQUET CUFF) ×2
CUFF TRNQT CYL 34X4X40X1 (TOURNIQUET CUFF) ×1 IMPLANT
DECANTER SPIKE VIAL GLASS SM (MISCELLANEOUS) ×3 IMPLANT
DRAPE EXTREMITY T 121X128X90 (DRAPE) ×3 IMPLANT
DRAPE POUCH INSTRU U-SHP 10X18 (DRAPES) ×3 IMPLANT
DRAPE U-SHAPE 47X51 STRL (DRAPES) ×3 IMPLANT
DRSG ADAPTIC 3X8 NADH LF (GAUZE/BANDAGES/DRESSINGS) ×3 IMPLANT
DRSG PAD ABDOMINAL 8X10 ST (GAUZE/BANDAGES/DRESSINGS) ×3 IMPLANT
DURAPREP 26ML APPLICATOR (WOUND CARE) ×3 IMPLANT
ELECT REM PT RETURN 9FT ADLT (ELECTROSURGICAL) ×3
ELECTRODE REM PT RTRN 9FT ADLT (ELECTROSURGICAL) ×1 IMPLANT
EVACUATOR 1/8 PVC DRAIN (DRAIN) ×3 IMPLANT
FACESHIELD WRAPAROUND (MASK) ×15 IMPLANT
GAUZE SPONGE 4X4 12PLY STRL (GAUZE/BANDAGES/DRESSINGS) ×3 IMPLANT
GLOVE BIO SURGEON STRL SZ7.5 (GLOVE) IMPLANT
GLOVE BIO SURGEON STRL SZ8 (GLOVE) ×3 IMPLANT
GLOVE BIOGEL PI IND STRL 6.5 (GLOVE) ×2 IMPLANT
GLOVE BIOGEL PI IND STRL 7.0 (GLOVE) ×1 IMPLANT
GLOVE BIOGEL PI IND STRL 7.5 (GLOVE) ×1 IMPLANT
GLOVE BIOGEL PI IND STRL 8 (GLOVE) ×1 IMPLANT
GLOVE BIOGEL PI INDICATOR 6.5 (GLOVE) ×4
GLOVE BIOGEL PI INDICATOR 7.0 (GLOVE) ×2
GLOVE BIOGEL PI INDICATOR 7.5 (GLOVE) ×2
GLOVE BIOGEL PI INDICATOR 8 (GLOVE) ×2
GLOVE SURG SS PI 6.5 STRL IVOR (GLOVE) ×6 IMPLANT
GLOVE SURG SS PI 7.5 STRL IVOR (GLOVE) ×3 IMPLANT
GOWN PREVENTION PLUS XLARGE (GOWN DISPOSABLE) ×3 IMPLANT
GOWN STRL REUS W/TWL LRG LVL3 (GOWN DISPOSABLE) ×6 IMPLANT
GOWN STRL REUS W/TWL XL LVL3 (GOWN DISPOSABLE) ×3 IMPLANT
HANDPIECE INTERPULSE COAX TIP (DISPOSABLE) ×2
IMMOBILIZER KNEE 20 (SOFTGOODS) ×3
IMMOBILIZER KNEE 20 THIGH 36 (SOFTGOODS) ×1 IMPLANT
KIT BASIN OR (CUSTOM PROCEDURE TRAY) ×3 IMPLANT
MANIFOLD NEPTUNE II (INSTRUMENTS) ×3 IMPLANT
NDL SAFETY ECLIPSE 18X1.5 (NEEDLE) ×2 IMPLANT
NEEDLE HYPO 18GX1.5 SHARP (NEEDLE) ×4
NS IRRIG 1000ML POUR BTL (IV SOLUTION) ×3 IMPLANT
PACK TOTAL JOINT (CUSTOM PROCEDURE TRAY) ×3 IMPLANT
PADDING CAST COTTON 6X4 STRL (CAST SUPPLIES) ×9 IMPLANT
PEN SKIN MARKING BROAD (MISCELLANEOUS) ×3 IMPLANT
POSITIONER SURGICAL ARM (MISCELLANEOUS) ×3 IMPLANT
SET HNDPC FAN SPRY TIP SCT (DISPOSABLE) ×1 IMPLANT
STRIP CLOSURE SKIN 1/2X4 (GAUZE/BANDAGES/DRESSINGS) ×2 IMPLANT
SUCTION FRAZIER 12FR DISP (SUCTIONS) ×3 IMPLANT
SUT MNCRL AB 4-0 PS2 18 (SUTURE) ×3 IMPLANT
SUT VIC AB 2-0 CT1 27 (SUTURE) ×6
SUT VIC AB 2-0 CT1 TAPERPNT 27 (SUTURE) ×3 IMPLANT
SUT VLOC 180 0 24IN GS25 (SUTURE) ×3 IMPLANT
SYR 20CC LL (SYRINGE) ×3 IMPLANT
SYR 50ML LL SCALE MARK (SYRINGE) ×3 IMPLANT
TOWEL OR 17X26 10 PK STRL BLUE (TOWEL DISPOSABLE) ×3 IMPLANT
TOWEL OR NON WOVEN STRL DISP B (DISPOSABLE) ×3 IMPLANT
TRAY FOLEY W/METER SILVER 14FR (SET/KITS/TRAYS/PACK) ×3 IMPLANT
WATER STERILE IRR 1500ML POUR (IV SOLUTION) ×3 IMPLANT
WRAP KNEE MAXI GEL POST OP (GAUZE/BANDAGES/DRESSINGS) ×3 IMPLANT
YANKAUER SUCT BULB TIP 10FT TU (MISCELLANEOUS) ×3 IMPLANT

## 2015-05-30 NOTE — Plan of Care (Signed)
Problem: Consults Goal: Diagnosis- Total Joint Replacement Primary Total Knee     

## 2015-05-30 NOTE — Anesthesia Preprocedure Evaluation (Addendum)
Anesthesia Evaluation  Patient identified by MRN, date of birth, ID band Patient awake    Reviewed: Allergy & Precautions, H&P , NPO status , Patient's Chart, lab work & pertinent test results, reviewed documented beta blocker date and time   Airway Mallampati: II  TM Distance: >3 FB Neck ROM: Full    Dental no notable dental hx. (+) Teeth Intact, Dental Advisory Given   Pulmonary neg pulmonary ROS,  breath sounds clear to auscultation  Pulmonary exam normal       Cardiovascular hypertension, Pt. on medications and Pt. on home beta blockers Rhythm:Regular Rate:Normal     Neuro/Psych negative neurological ROS  negative psych ROS   GI/Hepatic Neg liver ROS, GERD-  Medicated and Controlled,  Endo/Other  Morbid obesity  Renal/GU negative Renal ROS  negative genitourinary   Musculoskeletal  (+) Arthritis -, Osteoarthritis,    Abdominal   Peds  Hematology negative hematology ROS (+) anemia ,   Anesthesia Other Findings   Reproductive/Obstetrics negative OB ROS                           Anesthesia Physical Anesthesia Plan  ASA: III  Anesthesia Plan: MAC and Spinal   Post-op Pain Management:    Induction: Intravenous  Airway Management Planned: Simple Face Mask  Additional Equipment:   Intra-op Plan:   Post-operative Plan:   Informed Consent: I have reviewed the patients History and Physical, chart, labs and discussed the procedure including the risks, benefits and alternatives for the proposed anesthesia with the patient or authorized representative who has indicated his/her understanding and acceptance.   Dental advisory given  Plan Discussed with: CRNA  Anesthesia Plan Comments:         Anesthesia Quick Evaluation

## 2015-05-30 NOTE — Discharge Instructions (Addendum)
° °Dr. Frank Aluisio °Total Joint Specialist °[facility] °[address] °[place], [place] [address] °[phone] ° °TOTAL KNEE REPLACEMENT POSTOPERATIVE DIRECTIONS ° °Knee Rehabilitation, Guidelines Following Surgery  °Results after knee surgery are often greatly improved when you follow the exercise, range of motion and muscle strengthening exercises prescribed by your doctor. Safety measures are also important to protect the knee from further injury. Any time any of these exercises cause you to have increased pain or swelling in your knee joint, decrease the amount until you are comfortable again and slowly increase them. If you have problems or questions, call your caregiver or physical therapist for advice.  ° °HOME CARE INSTRUCTIONS  °Remove items at home which could result in a fall. This includes throw rugs or furniture in walking pathways.  °· ICE to the affected knee every three hours for 30 minutes at a time and then as needed for pain and swelling.  Continue to use ice on the knee for pain and swelling from surgery. You may notice swelling that will progress down to the foot and ankle.  This is normal after surgery.  Elevate the leg when you are not up walking on it.   °· Continue to use the breathing machine which will help keep your temperature down.  It is common for your temperature to cycle up and down following surgery, especially at night when you are not up moving around and exerting yourself.  The breathing machine keeps your lungs expanded and your temperature down. °· Do not place pillow under knee, focus on keeping the knee straight while resting ° °DIET °You may resume your previous home diet once your are discharged from the hospital. ° °DRESSING / WOUND CARE / SHOWERING °You may shower 3 days after surgery, but keep the wounds dry during showering.  You may use an occlusive plastic wrap (Press'n Seal for example), NO SOAKING/SUBMERGING IN THE BATHTUB.  If the  bandage gets wet, change with a clean dry gauze.  If the incision gets wet, pat the wound dry with a clean towel. °You may start showering once you are discharged home but do not submerge the incision under water. Just pat the incision dry and apply a dry gauze dressing on daily. °Change the surgical dressing daily and reapply a dry dressing each time. ° °ACTIVITY °Walk with your walker as instructed. °Use walker as long as suggested by your caregivers. °Avoid periods of inactivity such as sitting longer than an hour when not asleep. This helps prevent blood clots.  °You may resume a sexual relationship in one month or when given the OK by your doctor.  °You may return to work once you are cleared by your doctor.  °Do not drive a car for 6 weeks or until released by you surgeon.  °Do not drive while taking narcotics. ° °WEIGHT BEARING °Weight bearing as tolerated with assist device (walker, cane, etc) as directed, use it as long as suggested by your surgeon or therapist, typically at least 4-6 weeks. ° °POSTOPERATIVE CONSTIPATION PROTOCOL °Constipation - defined medically as fewer than three stools per week and severe constipation as less than one stool per week. ° °One of the most common issues patients have following surgery is constipation.  Even if you have a regular bowel pattern at home, your normal regimen is likely to be disrupted due to multiple reasons following surgery.  Combination of anesthesia, postoperative narcotics, change in appetite and fluid intake all can affect your bowels.    In order to avoid complications following surgery, here are some recommendations in order to help you during your recovery period. ° °Colace (docusate) - Pick up an over-the-counter form of Colace or another stool softener and take twice a day as long as you are requiring postoperative pain medications.  Take with a full glass of water daily.  If you experience loose stools or diarrhea, hold the colace until you stool forms  back up.  If your symptoms do not get better within 1 week or if they get worse, check with your doctor. ° °Dulcolax (bisacodyl) - Pick up over-the-counter and take as directed by the product packaging as needed to assist with the movement of your bowels.  Take with a full glass of water.  Use this product as needed if not relieved by Colace only.  ° °MiraLax (polyethylene glycol) - Pick up over-the-counter to have on hand.  MiraLax is a solution that will increase the amount of water in your bowels to assist with bowel movements.  Take as directed and can mix with a glass of water, juice, soda, coffee, or tea.  Take if you go more than two days without a movement. °Do not use MiraLax more than once per day. Call your doctor if you are still constipated or irregular after using this medication for 7 days in a row. ° °If you continue to have problems with postoperative constipation, please contact the office for further assistance and recommendations.  If you experience "the worst abdominal pain ever" or develop nausea or vomiting, please contact the office immediatly for further recommendations for treatment. ° °ITCHING ° If you experience itching with your medications, try taking only a single pain pill, or even half a pain pill at a time.  You can also use Benadryl over the counter for itching or also to help with sleep.  ° °TED HOSE STOCKINGS °Wear the elastic stockings on both legs for three weeks following surgery during the day but you may remove then at night for sleeping. ° °MEDICATIONS °See your medication summary on the “After Visit Summary” that the nursing staff will review with you prior to discharge.  You may have some home medications which will be placed on hold until you complete the course of blood thinner medication.  It is important for you to complete the blood thinner medication as prescribed by your surgeon.  Continue your approved medications as instructed at time of  discharge. ° °PRECAUTIONS °If you experience chest pain or shortness of breath - call 911 immediately for transfer to the hospital emergency department.  °If you develop a fever greater that 101 F, purulent drainage from wound, increased redness or drainage from wound, foul odor from the wound/dressing, or calf pain - CONTACT YOUR SURGEON.   °                                                °FOLLOW-UP APPOINTMENTS °Make sure you keep all of your appointments after your operation with your surgeon and caregivers. You should call the office at the above phone number and make an appointment for approximately two weeks after the date of your surgery or on the date instructed by your surgeon outlined in the "After Visit Summary". ° ° °RANGE OF MOTION AND STRENGTHENING EXERCISES  °Rehabilitation of the knee is important following a knee injury or   an operation. After just a few days of immobilization, the muscles of the thigh which control the knee become weakened and shrink (atrophy). Knee exercises are designed to build up the tone and strength of the thigh muscles and to improve knee motion. Often times heat used for twenty to thirty minutes before working out will loosen up your tissues and help with improving the range of motion but do not use heat for the first two weeks following surgery. These exercises can be done on a training (exercise) mat, on the floor, on a table or on a bed. Use what ever works the best and is most comfortable for you Knee exercises include:  °Leg Lifts - While your knee is still immobilized in a splint or cast, you can do straight leg raises. Lift the leg to 60 degrees, hold for 3 sec, and slowly lower the leg. Repeat 10-20 times 2-3 times daily. Perform this exercise against resistance later as your knee gets better.  °Quad and Hamstring Sets - Tighten up the muscle on the front of the thigh (Quad) and hold for 5-10 sec. Repeat this 10-20 times hourly. Hamstring sets are done by pushing the  foot backward against an object and holding for 5-10 sec. Repeat as with quad sets.  °· Leg Slides: Lying on your back, slowly slide your foot toward your buttocks, bending your knee up off the floor (only go as far as is comfortable). Then slowly slide your foot back down until your leg is flat on the floor again. °· Angel Wings: Lying on your back spread your legs to the side as far apart as you can without causing discomfort.  °A rehabilitation program following serious knee injuries can speed recovery and prevent re-injury in the future due to weakened muscles. Contact your doctor or a physical therapist for more information on knee rehabilitation.  ° °IF YOU ARE TRANSFERRED TO A SKILLED REHAB FACILITY °If the patient is transferred to a skilled rehab facility following release from the hospital, a list of the current medications will be sent to the facility for the patient to continue.  When discharged from the skilled rehab facility, please have the facility set up the patient's Home Health Physical Therapy prior to being released. Also, the skilled facility will be responsible for providing the patient with their medications at time of release from the facility to include their pain medication, the muscle relaxants, and their blood thinner medication. If the patient is still at the rehab facility at time of the two week follow up appointment, the skilled rehab facility will also need to assist the patient in arranging follow up appointment in our office and any transportation needs. ° °MAKE SURE YOU:  °Understand these instructions.  °Get help right away if you are not doing well or get worse.  ° ° °Pick up stool softner and laxative for home use following surgery while on pain medications. °Do not submerge incision under water. °Please use good hand washing techniques while changing dressing each day. °May shower starting three days after surgery. °Please use a clean towel to pat the incision dry following  showers. °Continue to use ice for pain and swelling after surgery. °Do not use any lotions or creams on the incision until instructed by your surgeon. ° °Take Xarelto for two and a half more weeks, then discontinue Xarelto. °Once the patient has completed the blood thinner regimen, then take a Baby 81 mg Aspirin daily for three more weeks. ° ° ° °  Information on my medicine - XARELTO (Rivaroxaban)  This medication education was reviewed with me or my healthcare representative as part of my discharge preparation.  The pharmacist that spoke with me during my hospital stay was:  Biagio Borg, [facility]  Why was Xarelto prescribed for you? Xarelto was prescribed for you to reduce the risk of blood clots forming after orthopedic surgery. The medical term for these abnormal blood clots is venous thromboembolism (VTE).  What do you need to know about xarelto ? Take your Xarelto ONCE DAILY at the same time every day. You may take it either with or without food.  If you have difficulty swallowing the tablet whole, you may crush it and mix in applesauce just prior to taking your dose.  Take Xarelto exactly as prescribed by your doctor and DO NOT stop taking Xarelto without talking to the doctor who prescribed the medication.  Stopping without other VTE prevention medication to take the place of Xarelto may increase your risk of developing a clot.  After discharge, you should have regular check-up appointments with your healthcare provider that is prescribing your Xarelto.    What do you do if you miss a dose? If you miss a dose, take it as soon as you remember on the same day then continue your regularly scheduled once daily regimen the next day. Do not take two doses of Xarelto on the same day.   Important Safety Information A possible side effect of Xarelto is bleeding. You should call your healthcare provider right away if you experience any of the following: ? Bleeding from an  injury or your nose that does not stop. ? Unusual colored urine (red or dark brown) or unusual colored stools (red or black). ? Unusual bruising for unknown reasons. ? A serious fall or if you hit your head (even if there is no bleeding).  Some medicines may interact with Xarelto and might increase your risk of bleeding while on Xarelto. To help avoid this, consult your healthcare provider or pharmacist prior to using any new prescription or non-prescription medications, including herbals, vitamins, non-steroidal anti-inflammatory drugs (NSAIDs) and supplements.  This website has more information on Xarelto: [email or web address]

## 2015-05-30 NOTE — Anesthesia Postprocedure Evaluation (Signed)
  Anesthesia Post-op Note  Patient: Morgan Frye  Procedure(s) Performed: Procedure(s): LEFT TOTAL KNEE ARTHROPLASTY (Left)  Patient Location: PACU  Anesthesia Type: Spinal/MAC  Level of Consciousness: awake and alert   Airway and Oxygen Therapy: Patient Spontanous Breathing  Post-op Pain: Controlled  Post-op Assessment: Post-op Vital signs reviewed, Patient's Cardiovascular Status Stable and Respiratory Function Stable. Block receeding.  Post-op Vital Signs: Reviewed  Filed Vitals:   05/30/15 1207  BP: 116/63  Pulse: 68  Temp: 37.1 C  Resp: 16    Complications: No apparent anesthesia complications

## 2015-05-30 NOTE — Interval H&P Note (Signed)
History and Physical Interval Note:  05/30/2015 6:43 AM  Morgan Frye  has presented today for surgery, with the diagnosis of OA OF LEFT KNEE  The various methods of treatment have been discussed with the patient and family. After consideration of risks, benefits and other options for treatment, the patient has consented to  Procedure(s): LEFT TOTAL KNEE ARTHROPLASTY (Left) as a surgical intervention .  The patient's history has been reviewed, patient examined, no change in status, stable for surgery.  I have reviewed the patient's chart and labs.  Questions were answered to the patient's satisfaction.     Gearlean Alf

## 2015-05-30 NOTE — Evaluation (Signed)
Physical Therapy Evaluation Patient Details Name: TIERNEY BEHL MRN: [id number] DOB: 02-06-1951 Today's Date: 05/30/2015   History of Present Illness  L TKA  Clinical Impression  Patient still with numbness so only sat at the edge of the bed as patient wanted to sit uop due to back beginning to bother her. Patient will benefit from PT while in acute care to address problems listed in note below.    Follow Up Recommendations Home health PT;Supervision/Assistance - 24 hour    Equipment Recommendations  Crutches    Recommendations for Other Services       Precautions / Restrictions Precautions Precautions: Knee Required Braces or Orthoses: Knee Immobilizer - Left Knee Immobilizer - Left: Discontinue once straight leg raise with < 10 degree lag      Mobility  Bed Mobility Overal bed mobility: Needs Assistance Bed Mobility: Supine to Sit     Supine to sit: Min assist     General bed mobility comments: support L leg off and onto bed.  Transfers                 General transfer comment: did not stand due spinal not completely worn off. scoots self along bed to move up  Ambulation/Gait                Stairs            Wheelchair Mobility    Modified Rankin (Stroke Patients Only)       Balance                                             Pertinent Vitals/Pain Pain Assessment: 0-10 Pain Score: 3  Pain Location: L knee Pain Descriptors / Indicators: Discomfort;Dull Pain Intervention(s): Limited activity within patient's tolerance;Premedicated before session;Repositioned;Ice applied    Home Living Family/patient expects to be discharged to:: Private residence Living Arrangements: Spouse/significant other Available Help at Discharge: Family Type of Home: House Home Access: Stairs to enter   Technical brewer of Steps: 3-4 Home Layout: Two level Home Equipment: Cane - single point      Prior Function Level of  Independence: Independent               Hand Dominance        Extremity/Trunk Assessment               Lower Extremity Assessment: RLE deficits/detail;LLE deficits/detail RLE Deficits / Details: able to lift from bed, still has tingling in foot, not awake yet LLE Deficits / Details: Unable to lift leg, still numbness in foot     Communication   Communication: No difficulties  Cognition Arousal/Alertness: Awake/alert Behavior During Therapy: WFL for tasks assessed/performed Overall Cognitive Status: Within Functional Limits for tasks assessed                      General Comments      Exercises        Assessment/Plan    PT Assessment Patient needs continued PT services  PT Diagnosis Acute pain;Difficulty walking   PT Problem List Decreased strength;Decreased range of motion;Decreased activity tolerance;Decreased mobility;Decreased knowledge of precautions;Decreased safety awareness;Decreased knowledge of use of DME;Pain  PT Treatment Interventions DME instruction;Gait training;Stair training;Functional mobility training;Therapeutic activities   PT Goals (Current goals can be found in the Care Plan section) Acute Rehab PT Goals Patient Stated  Goal: to go home this time PT Goal Formulation: With patient/family Time For Goal Achievement: 06/04/15 Potential to Achieve Goals: Good    Frequency 7X/week   Barriers to discharge        Co-evaluation               End of Session   Activity Tolerance: Patient tolerated treatment well Patient left: in bed;with call bell/phone within reach;with family/visitor present Nurse Communication: Mobility status         Time: 2111-7356 PT Time Calculation (min) (ACUTE ONLY): 17 min   Charges:   PT Evaluation $Initial PT Evaluation Tier I: 1 Procedure     PT G CodesClaretha Cooper 05/30/2015, 5:23 PM Tresa Endo PT [phone]

## 2015-05-30 NOTE — Anesthesia Procedure Notes (Addendum)
Spinal Patient location during procedure: OR Start time: 05/30/2015 9:20 AM End time: 05/30/2015 9:25 AM Staffing Anesthesiologist: Roderic Palau Performed by: anesthesiologist  Preanesthetic Checklist Completed: patient identified, surgical consent, pre-op evaluation, timeout performed, IV checked, risks and benefits discussed and monitors and equipment checked Spinal Block Patient position: sitting Prep: Betadine Patient monitoring: cardiac monitor, continuous pulse ox and blood pressure Approach: midline Location: L4-5 Injection technique: single-shot Needle Needle type: Spinocan  Needle gauge: 22 G (Attempted 24G Sprotte. Unable to place.) Needle length: 10 cm Assessment Sensory level: T6 Additional Notes Functioning IV was confirmed and monitors were applied. Sterile prep and drape, including hand hygiene and sterile gloves were used. The patient was positioned and the spine was prepped. The skin was anesthetized with lidocaine.  Free flow of clear CSF was obtained prior to injecting local anesthetic into the CSF.  The spinal needle aspirated freely following injection.  The needle was carefully withdrawn.  The patient tolerated the procedure well.   Procedure Name: LMA Insertion Date/Time: 05/30/2015 9:47 AM Performed by: Lollie Sails Pre-anesthesia Checklist: Patient identified, Emergency Drugs available, Suction available, Patient being monitored and Timeout performed Oxygen Delivery Method: Circle system utilized Preoxygenation: Pre-oxygenation with 100% oxygen Intubation Type: IV induction LMA: LMA inserted LMA Size: 4.0 Number of attempts: 1 Placement Confirmation: positive ETCO2 and breath sounds checked- equal and bilateral Tube secured with: Tape Dental Injury: Teeth and Oropharynx as per pre-operative assessment

## 2015-05-30 NOTE — Transfer of Care (Signed)
Immediate Anesthesia Transfer of Care Note  Patient: Morgan Frye  Procedure(s) Performed: Procedure(s): LEFT TOTAL KNEE ARTHROPLASTY (Left)  Patient Location: PACU  Anesthesia Type:General, Regional and failed spinal so converted to general  Level of Consciousness: awake, alert  and oriented  Airway & Oxygen Therapy: Patient Spontanous Breathing, Patient connected to face mask oxygen and able to wiggle toes slightly.  Post-op Assessment: Report given to RN and Post -op Vital signs reviewed and stable  Post vital signs: Reviewed and stable  Last Vitals:  Filed Vitals:   05/30/15 0620  BP: 127/80  Pulse: 83  Temp: 36.8 C  Resp: 18    Complications: No apparent anesthesia complications

## 2015-05-30 NOTE — H&P (View-Only) (Signed)
Morgan Frye DOB: Apr 29, 1951 Married / Language: English / Race: White Female Date of Admission:  05/30/2015 CC:  Left Knee Pain History of Present Illness The patient is a 64 year old female who comes in for a preoperative History and Physical. The patient is scheduled for a left total knee arthroplasty to be performed by Dr. Dione Plover. Aluisio, MD at [facility] on 05-30-2015. The patient is a 64 year old female who presents for follow up of their knee. The patient is being followed for their left knee pain and osteoarthritis. They are now several months out from cortisone injection. Symptoms reported include: pain and aching. The patient feels that they got some temporary relief which was mild to moderate. The patient has reported improvement of their symptoms with: Cortisone injections. She wants to have her knee replaced. She is now almost 7 months out from her right total knee, the right knee is doing well. She states the left knee is as bad as the right knee was prior to when she had that replaced. She has done great with the right knee and is very happy with that. She is at a stage where she wants to get the left knee fixed now. She is admitted for total knee replacement on the left. They have been treated conservatively in the past for the above stated problem and despite conservative measures, they continue to have progressive pain and severe functional limitations and dysfunction. They have failed non-operative management including home exercise, medications, and injections. It is felt that they would benefit from undergoing total joint replacement. Risks and benefits of the procedure have been discussed with the patient and they elect to proceed with surgery. There are no active contraindications to surgery such as ongoing infection or rapidly progressive neurological disease.  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 Childhood RXN  Family History Hypertension Father, Mother, Paternal Grandmother. Osteoarthritis Mother. Liver Disease, Chronic Maternal Grandmother, Mother. Rheumatoid Arthritis Father. Cerebrovascular Accident Paternal Grandmother. Cancer Father.  Social History No history of drug/alcohol rehab Tobacco / smoke exposure 11/11/2013: no Not under pain contract Tobacco use Never smoker. 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  Medication History Clindamycin HCl (300MG  Capsule, 2 (two) Capsule Oral 2 tablets 1-2h prior to [facility], Taken starting 08/10/2014) Active. Ciclopirox Olamine (0.77% Cream, External) Active. Lisinopril-Hydrochlorothiazide (20-12.5MG  Tablet, Oral) Active. Etodolac ER (400MG  Tablet ER 24HR, Oral) Active. Toprol XL (50MG  Tablet ER, Oral) Active. Lipitor (20MG  Tablet, Oral) Active. Glucosamine Chondroitin Complx (Oral) Active. Multivitamin (Oral) Active. Calcium Carbonate (600MG  Tablet, Oral) Active. Omega 3 (Oral) Specific dose unknown - Active. Vitamin D (400UNIT Tablet, Oral) Active. Aspirin EC (325MG  Tablet DR, Oral) Active.  Past Surgical History Dilation and Curettage of Uterus Cataract Surgery bilateral Tonsillectomy Total Knee Replacement - Right   Review of  Systems General Not Present- Chills, Fatigue, Fever, Memory Loss, Night Sweats, Weight Gain and Weight Loss. Skin Not Present- Eczema, Hives, Itching, Lesions and Rash. HEENT Not Present- Dentures,  Double Vision, Headache, Hearing Loss, Tinnitus and Visual Loss. Respiratory Not Present- Allergies, Chronic Cough, Coughing up blood, Shortness of breath at rest and Shortness of breath with exertion. Cardiovascular Not Present- Chest Pain, Difficulty Breathing Lying Down, Murmur, Palpitations, Racing/skipping heartbeats and Swelling. Gastrointestinal Not Present- Abdominal Pain, Bloody Stool, Constipation, Diarrhea, Difficulty Swallowing, Heartburn, Jaundice, Loss of appetitie, Nausea and Vomiting. Female Genitourinary Not Present- Blood in Urine, Discharge, Flank Pain, Incontinence, Painful Urination, Urgency, Urinary frequency, Urinary Retention, Urinating at Night and Weak urinary stream. Musculoskeletal Present- Joint Pain and Morning Stiffness. Not Present- Back Pain, Joint Swelling, Muscle Pain, Muscle Weakness and Spasms. Neurological Not Present- Blackout spells, Difficulty with balance, Dizziness, Paralysis, Tremor and Weakness. Psychiatric Not Present- Insomnia.   Vitals Weight: 245 lb Height: 66in Weight was reported by patient. Height was reported by patient. Body Surface Area: 2.18 m Body Mass Index: 39.54 kg/m  BP: 128/78 (Sitting, Right Arm, Standard)  Physical Exam General Mental Status -Alert, cooperative and good historian. General Appearance-pleasant, Not in acute distress. Orientation-Oriented X3. Build & Nutrition-Well nourished and Well developed.  Head and Neck Head-normocephalic, atraumatic . Neck Global Assessment - supple, no bruit auscultated on the right, no bruit auscultated on the left.  Eye Vision-Wears corrective lenses. Pupil - Bilateral-Regular and Round. Motion - Bilateral-EOMI.  Chest and Lung Exam Auscultation Breath  sounds - clear at anterior chest wall and clear at posterior chest wall. Adventitious sounds - No Adventitious sounds.  Cardiovascular Auscultation Rhythm - Regular rate and rhythm. Heart Sounds - S1 WNL and S2 WNL. Murmurs & Other Heart Sounds - Auscultation of the heart reveals - No Murmurs.  Abdomen Inspection Contour - Generalized moderate distention. Palpation/Percussion Tenderness - Abdomen is non-tender to palpation. Rigidity (guarding) - Abdomen is soft. Auscultation Auscultation of the abdomen reveals - Bowel sounds normal.  Female Genitourinary Note: Not done, not pertinent to present illness   Musculoskeletal Note: She is alert and oriented, no apparent distress. Her right knee looks excellent. There is no swelling. Range 0 to 120 with no tenderness or instability. Left knee about 5 to 125. Moderate crepitus on range of motion. Tender medial and lateral with no instability noted.  RADIOGRAPHS: We reviewed radiographs from last visit. The prosthesis on the right is in excellent position. No periprosthetic abnormalities. ON the left she is bone on bone medial and patellofemoral large osteophytes.   Assessment & Plan  Status post total right knee replacement (R60.454) Primary osteoarthritis of left knee (M17.12) Note:Surgical Plans: Left Total Knee Replacement  Disposition: Home  PCP: Dr. Maurice Small, [facility] at [facility] - Patient has been seen preoperatively and felt to be stable for surgery.  IV TXA  Anesthesia Issues: NONE  Signed electronically by Ok Edwards, III PA-C

## 2015-05-30 NOTE — Op Note (Signed)
Pre-operative diagnosis- Osteoarthritis  Left knee(s)  Post-operative diagnosis- Osteoarthritis Left knee(s)  Procedure-  Left  Total Knee Arthroplasty  Surgeon- Morgan Frye. Morgan Lipford, MD  Assistant- Ardeen Jourdain, PA-C   Anesthesia-  Spinal  EBL-* No blood loss amount entered *   Drains Hemovac  Tourniquet time-  Total Tourniquet Time Documented: Thigh (Left) - 29 minutes Total: Thigh (Left) - 29 minutes     Complications- None  Condition-PACU - hemodynamically stable.   Brief Clinical Note  Morgan Frye is a 64 y.o. year old female with end stage OA of her left knee with progressively worsening pain and dysfunction. She has constant pain, with activity and at rest and significant functional deficits with difficulties even with ADLs. She has had extensive non-op management including analgesics, injections of cortisone and viscosupplements, and home exercise program, but remains in significant pain with significant dysfunction. Radiographs show bone on bone arthritis medial and patellofemoral. She presents now for left Total Knee Arthroplasty.    Procedure in detail---   The patient is brought into the operating room and positioned supine on the operating table. After successful administration of  Spinal,   a tourniquet is placed high on the  Left thigh(s) and the lower extremity is prepped and draped in the usual sterile fashion. Time out is performed by the operating team and then the  Left lower extremity is wrapped in Esmarch, knee flexed and the tourniquet inflated to 300 mmHg.       A midline incision is made with a ten blade through the subcutaneous tissue to the level of the extensor mechanism. A fresh blade is used to make a medial parapatellar arthrotomy. Soft tissue over the proximal medial tibia is subperiosteally elevated to the joint line with a knife and into the semimembranosus bursa with a Cobb elevator. Soft tissue over the proximal lateral tibia is elevated with  attention being paid to avoiding the patellar tendon on the tibial tubercle. The patella is everted, knee flexed 90 degrees and the ACL and PCL are removed. Findings are bone on bone medial and patellofemoral with large global osteophytes.        The drill is used to create a starting hole in the distal femur and the canal is thoroughly irrigated with sterile saline to remove the fatty contents. The 5 degree Left  valgus alignment guide is placed into the femoral canal and the distal femoral cutting block is pinned to remove 10 mm off the distal femur. Resection is made with an oscillating saw.      The tibia is subluxed forward and the menisci are removed. The extramedullary alignment guide is placed referencing proximally at the medial aspect of the tibial tubercle and distally along the second metatarsal axis and tibial crest. The block is pinned to remove 26mm off the more deficient medial  side. Resection is made with an oscillating saw. Size 6is the most appropriate size for the tibia and the proximal tibia is prepared with the modular drill and keel punch for that size.      The femoral sizing guide is placed and size 6 is most appropriate. Rotation is marked off the epicondylar axis and confirmed by creating a rectangular flexion gap at 90 degrees. The size 6 cutting block is pinned in this rotation and the anterior, posterior and chamfer cuts are made with the oscillating saw. The intercondylar block is then placed and that cut is made.      Trial size 6 tibial component,  trial size 6 posterior stabilized femur and a 6  mm posterior stabilized rotating platform insert trial is placed. Full extension is achieved with excellent varus/valgus and anterior/posterior balance throughout full range of motion. The patella is everted and thickness measured to be 22  mm. Free hand resection is taken to 12 mm, a 38 template is placed, lug holes are drilled, trial patella is placed, and it tracks normally.  Osteophytes are removed off the posterior femur with the trial in place. All trials are removed and the cut bone surfaces prepared with pulsatile lavage. Cement is mixed and once ready for implantation, the size 6 tibial implant, size  6 posterior stabilized femoral component, and the size 38 patella are cemented in place and the patella is held with the clamp. The trial insert is placed and the knee held in full extension. The Exparel (20 ml mixed with 30 ml saline) and .25% Bupivicaine, are injected into the extensor mechanism, posterior capsule, medial and lateral gutters and subcutaneous tissues.  All extruded cement is removed and once the cement is hard the permanent 6 mm posterior stabilized rotating platform insert is placed into the tibial tray.      The wound is copiously irrigated with saline solution and the extensor mechanism closed over a hemovac drain with #1 V-loc suture. The tourniquet is released for a total tourniquet time of 29  minutes. Flexion against gravity is 140 degrees and the patella tracks normally. Subcutaneous tissue is closed with 2.0 vicryl and subcuticular with running 4.0 Monocryl. The incision is cleaned and dried and steri-strips and a bulky sterile dressing are applied. The limb is placed into a knee immobilizer and the patient is awakened and transported to recovery in stable condition.      Please note that a surgical assistant was a medical necessity for this procedure in order to perform it in a safe and expeditious manner. Surgical assistant was necessary to retract the ligaments and vital neurovascular structures to prevent injury to them and also necessary for proper positioning of the limb to allow for anatomic placement of the prosthesis.   Morgan Frye Morgan Stencil, MD    05/30/2015, 10:16 AM

## 2015-05-31 ENCOUNTER — Encounter (HOSPITAL_COMMUNITY): Payer: Self-pay | Admitting: Orthopedic Surgery

## 2015-05-31 LAB — CBC
HEMATOCRIT: 38.3 % (ref 36.0–46.0)
Hemoglobin: 13.4 g/dL (ref 12.0–15.0)
MCH: 32.3 pg (ref 26.0–34.0)
MCHC: 35 g/dL (ref 30.0–36.0)
MCV: 92.3 fL (ref 78.0–100.0)
PLATELETS: 192 10*3/uL (ref 150–400)
RBC: 4.15 MIL/uL (ref 3.87–5.11)
RDW: 12.7 % (ref 11.5–15.5)
WBC: 12.8 10*3/uL — ABNORMAL HIGH (ref 4.0–10.5)

## 2015-05-31 LAB — BASIC METABOLIC PANEL
Anion gap: 7 (ref 5–15)
BUN: 11 mg/dL (ref 6–20)
CHLORIDE: 105 mmol/L (ref 101–111)
CO2: 23 mmol/L (ref 22–32)
Calcium: 8.5 mg/dL — ABNORMAL LOW (ref 8.9–10.3)
Creatinine, Ser: 0.73 mg/dL (ref 0.44–1.00)
GFR calc Af Amer: >60 mL/min (ref >60)
GFR calc non Af Amer: >60 mL/min (ref >60)
GLUCOSE: 271 mg/dL — AB (ref 65–99)
Potassium: 4.1 mmol/L (ref 3.5–5.1)
Sodium: 135 mmol/L (ref 135–145)

## 2015-05-31 MED ORDER — TRAMADOL HCL 50 MG PO TABS: 50.0000 mg | ORAL_TABLET | Freq: Four times a day (QID) | ORAL | Status: DC | PRN

## 2015-05-31 MED ORDER — OXYCODONE HCL 5 MG PO TABS: 5.0000 mg | ORAL_TABLET | ORAL | Status: DC | PRN

## 2015-05-31 MED ORDER — METHOCARBAMOL 500 MG PO TABS: 500.0000 mg | ORAL_TABLET | Freq: Four times a day (QID) | ORAL | Status: DC | PRN

## 2015-05-31 MED ORDER — RIVAROXABAN 10 MG PO TABS: 10.0000 mg | ORAL_TABLET | Freq: Every day | ORAL | Status: DC

## 2015-05-31 NOTE — Plan of Care (Signed)
Problem: Consults Goal: Diagnosis- Total Joint Replacement Outcome: Completed/Met Date Met:  05/31/15 Primary Total Knee LEFT

## 2015-05-31 NOTE — Progress Notes (Signed)
   Subjective: 1 Day Post-Op Procedure(s) (LRB): LEFT TOTAL KNEE ARTHROPLASTY (Left) Patient reports pain as mild.   Patient seen in rounds with Dr. Wynelle Link. Patient is well, and has had no acute complaints or problems We will start therapy today.  Plan is to go Home after hospital stay.  Objective: Vital signs in last 24 hours: Temp:  [97.6 F (36.4 C)-99 F (37.2 C)] 99 F (37.2 C) (08/02 0116) Pulse Rate:  [66-92] 88 (08/02 0116) Resp:  [12-20] 20 (08/02 0116) BP: (112-155)/(20-93) 123/90 mmHg (08/02 0116) SpO2:  [93 %-100 %] 94 % (08/02 0116)  Intake/Output from previous day:  Intake/Output Summary (Last 24 hours) at 05/31/15 0927 Last data filed at 05/31/15 0825  Gross per 24 hour  Intake 4528.33 ml  Output   2395 ml  Net 2133.33 ml    Intake/Output this shift: Total I/O In: 283.3 [P.O.:240; I.V.:43.3] Out: -   Labs:  Recent Labs  05/31/15 0417  HGB 13.4    Recent Labs  05/31/15 0417  WBC 12.8*  RBC 4.15  HCT 38.3  PLT 192    Recent Labs  05/31/15 0417  NA 135  K 4.1  CL 105  CO2 23  BUN 11  CREATININE 0.73  GLUCOSE 271*  CALCIUM 8.5*   No results for input(s): LABPT, INR in the last 72 hours.  EXAM General - Patient is Alert, Appropriate and Oriented Extremity - Neurovascular intact Sensation intact distally Dorsiflexion/Plantar flexion intact Dressing - dressing C/D/I Motor Function - intact, moving foot and toes well on exam.  Hemovac pulled without difficulty.  Past Medical History  Diagnosis Date  . Hypertension   . History of kidney stones     x1 lithotripsy-has others not a bother at this time  . GERD (gastroesophageal reflux disease)     mild- uses Tums as needed  . Arthritis     osteoarthritis- knees, hips, rt. hip bursitis  . Transfusion history     '1981- s/p childbirth  . Hemangioma of liver     very small- evaluated and considered stable- no further problems or follow up in many years  . History of palpitations      none since on Metoprolol.    Assessment/Plan: 1 Day Post-Op Procedure(s) (LRB): LEFT TOTAL KNEE ARTHROPLASTY (Left) Principal Problem:   OA (osteoarthritis) of knee  Estimated body mass index is 41.47 kg/(m^2) as calculated from the following:   Height as of this encounter: 5' 5.75" (1.67 m).   Weight as of this encounter: 115.667 kg (255 lb). Advance diet Up with therapy Plan for discharge tomorrow Discharge home with home health  DVT Prophylaxis - Xarelto Weight-Bearing as tolerated to left leg D/C O2 and Pulse OX and try on Room Air  Morgan Muslim, PA-C Orthopaedic Surgery 05/31/2015, 9:27 AM

## 2015-05-31 NOTE — Progress Notes (Signed)
Physical Therapy Treatment Patient Details Name: Morgan Frye MRN: [id number] DOB: 12-28-1950 Today's Date: 05/31/2015    History of Present Illness L TKA    PT Comments    POD # 1 pm session.  Assisted pt OOB to amb to BR to vopid then amb in hallway.  Returned to bed for a few more TE's then applied ICE.  Pt progressing well.  Follow Up Recommendations  Home health PT;Supervision/Assistance - 24 hour     Equipment Recommendations       Recommendations for Other Services       Precautions / Restrictions Precautions Precautions: Knee Restrictions Weight Bearing Restrictions: No    Mobility  Bed Mobility Overal bed mobility: Needs Assistance Bed Mobility: Supine to Sit     Supine to sit: Min guard;Min assist     General bed mobility comments: support L leg off and onto bed.  Transfers Overall transfer level: Needs assistance Equipment used: Rolling walker (2 wheeled) Transfers: Sit to/from Stand Sit to Stand: Min assist         General transfer comment: 50% VC's on proper tech and turn completion.  Assisted off bed and on/off toilet.   Ambulation/Gait Ambulation/Gait assistance: Min assist Ambulation Distance (Feet): 35 Feet Assistive device: Rolling walker (2 wheeled) Gait Pattern/deviations: Step-to pattern;Decreased stance time - left;Trunk flexed Gait velocity: increased time and 25% VC's on proper walker to self distance and up right posture.    General Gait Details: decreased   Stairs            Wheelchair Mobility    Modified Rankin (Stroke Patients Only)       Balance                                    Cognition Arousal/Alertness: Awake/alert Behavior During Therapy: WFL for tasks assessed/performed Overall Cognitive Status: Within Functional Limits for tasks assessed                      Exercises  10 reps L knee flex in sitting hold for 5 sec 10 reps L LE knee ext LAQ's holf for 5 sec    General  Comments        Pertinent Vitals/Pain Pain Assessment: 0-10 Pain Score: 3  Pain Location: L knee Pain Descriptors / Indicators: Aching;Sore Pain Intervention(s): Monitored during session;Premedicated before session;Repositioned;Ice applied    Home Living                      Prior Function            PT Goals (current goals can now be found in the care plan section) Progress towards PT goals: Progressing toward goals    Frequency  7X/week    PT Plan      Co-evaluation             End of Session   Activity Tolerance: Patient tolerated treatment well Patient left: in chair;with call bell/phone within reach;with family/visitor present     Time: 1435-1500 PT Time Calculation (min) (ACUTE ONLY): 25 min  Charges:  $Gait Training: 8-22 mins $Therapeutic Activity: 8-22 mins                    G Codes:      Morgan Frye  PTA [facility]  Acute  Rehab Pager      [phone]

## 2015-05-31 NOTE — Care Management Note (Signed)
Case Management Note  Patient Details  Name: Morgan Frye MRN: [id number] Date of Birth: 06/05/1951  Subjective/Objective:                   LEFT TOTAL KNEE ARTHROPLASTY (Left) Action/Plan:  Discharge planning Expected Discharge Date:  06/01/15               Expected Discharge Plan:  Home w Home Health Services  In-House Referral:     Discharge planning Services  CM Consult  Post Acute Care Choice:  Home Health Choice offered to:  Patient  DME Arranged:    DME Agency:     HH Arranged:  PT HH Agency:  [facility]  Status of Service:  Completed, signed off  Medicare Important Message Given:    Date Medicare IM Given:    Medicare IM give by:    Date Additional Medicare IM Given:    Additional Medicare Important Message give by:     If discussed at Long Length of Stay Meetings, dates discussed:    Additional Comments: CM met with pt in room to offer choice of home health agency.  Pt chooses [facility] to render HHPT.  Address and contact information verified by pt.  Pt has both rolling walker and 3n1 from previous surgery.  Referral emailed to [facility] rep, Tim.  No other CM needs were communicated. Jeffries, Sarah Christine, RN 05/31/2015, 1:58 PM  

## 2015-05-31 NOTE — Progress Notes (Signed)
OT Note  Patient Details Name: Morgan Frye MRN: [id number] DOB: [birth year]   Cancelled Treatment:    Reason Eval/Treat Not Completed: OT screened, no needs identified, will sign off. Patient had her other knee replaced about a year ago. She remembers all OT techniques for ADLs and has all necessary equipment. Patient in agreement that no OT is needed at this time. Will sign off.  Cniyah Sproull A 05/31/2015, 11:46 AM

## 2015-05-31 NOTE — Progress Notes (Signed)
Physical Therapy Treatment Patient Details Name: Morgan Frye MRN: [id number] DOB: Aug 09, 1951 Today's Date: 05/31/2015    History of Present Illness L TKA    PT Comments    POD # 1 am session.  Assisted pt OOB to amb to BR to void.  Assisted with amb a greater distance in hallway then returned to room to perform TKR TE's followed by ICE.  Follow Up Recommendations  Home health PT;Supervision/Assistance - 24 hour     Equipment Recommendations       Recommendations for Other Services       Precautions / Restrictions Precautions Precautions: Knee Restrictions Weight Bearing Restrictions: No    Mobility  Bed Mobility Overal bed mobility: Needs Assistance Bed Mobility: Supine to Sit     Supine to sit: Min guard;Min assist     General bed mobility comments: support L leg off and onto bed.  Transfers Overall transfer level: Needs assistance Equipment used: Rolling walker (2 wheeled) Transfers: Sit to/from Stand Sit to Stand: Min assist         General transfer comment: 50% VC's on proper tech and turn completion.  Assisted off bed and on/off toilet.   Ambulation/Gait Ambulation/Gait assistance: Min assist Ambulation Distance (Feet): 18 Feet Assistive device: Rolling walker (2 wheeled) Gait Pattern/deviations: Step-to pattern;Decreased stance time - left;Trunk flexed Gait velocity: increased time and 25% VC's on proper walker to self distance and up right posture.    General Gait Details: decreased   Stairs            Wheelchair Mobility    Modified Rankin (Stroke Patients Only)       Balance                                    Cognition Arousal/Alertness: Awake/alert Behavior During Therapy: WFL for tasks assessed/performed Overall Cognitive Status: Within Functional Limits for tasks assessed                      Exercises   Total Knee Replacement TE's 10 reps B LE ankle pumps 10 reps towel squeezes 10 reps knee  presses 10 reps heel slides  10 reps SAQ's 10 reps SLR's 10 reps ABD Followed by ICE     General Comments        Pertinent Vitals/Pain Pain Assessment: 0-10 Pain Score: 3  Pain Location: L knee Pain Descriptors / Indicators: Aching;Sore Pain Intervention(s): Monitored during session;Premedicated before session;Repositioned;Ice applied    Home Living                      Prior Function            PT Goals (current goals can now be found in the care plan section) Progress towards PT goals: Progressing toward goals    Frequency  7X/week    PT Plan      Co-evaluation             End of Session   Activity Tolerance: Patient tolerated treatment well Patient left: in chair;with call bell/phone within reach;with family/visitor present     Time: 1010-1040 PT Time Calculation (min) (ACUTE ONLY): 30 min  Charges:  $Gait Training: 8-22 mins $Therapeutic Exercise: 8-22 mins                    G Codes:      Rica Koyanagi  PTA [facility]  Acute  Rehab Pager      [phone]

## 2015-05-31 NOTE — Discharge Summary (Signed)
Physician Discharge Summary   Patient ID: Morgan Frye MRN: [id number] DOB/AGE: 62-Jun-1952 62 y.o.  Admit date: 05/30/2015 Discharge date: 06/01/2015  Primary Diagnosis:  Osteoarthritis Left knee(s  Admission Diagnoses:  Past Medical History  Diagnosis Date  . Hypertension   . History of kidney stones     x1 lithotripsy-has others not a bother at this time  . GERD (gastroesophageal reflux disease)     mild- uses Tums as needed  . Arthritis     osteoarthritis- knees, hips, rt. hip bursitis  . Transfusion history     '1981- s/p childbirth  . Hemangioma of liver     very small- evaluated and considered stable- no further problems or follow up in many years  . History of palpitations     none since on Metoprolol.   Discharge Diagnoses:   Principal Problem:   OA (osteoarthritis) of knee  Estimated body mass index is 41.47 kg/(m^2) as calculated from the following:   Height as of this encounter: 5' 5.75" (1.67 m).   Weight as of this encounter: 115.667 kg (255 lb).  Procedure:  Procedure(s) (LRB): LEFT TOTAL KNEE ARTHROPLASTY (Left)   Consults: None  HPI: Morgan Frye is a 64 y.o. year old female with end stage OA of her left knee with progressively worsening pain and dysfunction. She has constant pain, with activity and at rest and significant functional deficits with difficulties even with ADLs. She has had extensive non-op management including analgesics, injections of cortisone and viscosupplements, and home exercise program, but remains in significant pain with significant dysfunction. Radiographs show bone on bone arthritis medial and patellofemoral. She presents now for left Total Knee Arthroplasty.  Laboratory Data: Admission on 05/30/2015  Component Date Value Ref Range Status  . WBC 05/31/2015 12.8* 4.0 - 10.5 K/uL Final  . RBC 05/31/2015 4.15  3.87 - 5.11 MIL/uL Final  . Hemoglobin 05/31/2015 13.4  12.0 - 15.0 g/dL Final  . HCT 05/31/2015 38.3  36.0 - 46.0 %  Final  . MCV 05/31/2015 92.3  78.0 - 100.0 fL Final  . MCH 05/31/2015 32.3  26.0 - 34.0 pg Final  . MCHC 05/31/2015 35.0  30.0 - 36.0 g/dL Final  . RDW 05/31/2015 12.7  11.5 - 15.5 % Final  . Platelets 05/31/2015 192  150 - 400 K/uL Final  . Sodium 05/31/2015 135  135 - 145 mmol/L Final  . Potassium 05/31/2015 4.1  3.5 - 5.1 mmol/L Final  . Chloride 05/31/2015 105  101 - 111 mmol/L Final  . CO2 05/31/2015 23  22 - 32 mmol/L Final  . Glucose, Bld 05/31/2015 271* 65 - 99 mg/dL Final  . BUN 05/31/2015 11  6 - 20 mg/dL Final  . Creatinine, Ser 05/31/2015 0.73  0.44 - 1.00 mg/dL Final  . Calcium 05/31/2015 8.5* 8.9 - 10.3 mg/dL Final  . GFR calc non Af Amer 05/31/2015 >60  >60 mL/min Final  . GFR calc Af Amer 05/31/2015 >60  >60 mL/min Final   Comment: (NOTE) The eGFR has been calculated using the CKD EPI equation. This calculation has not been validated in all clinical situations. eGFR's persistently <60 mL/min signify possible Chronic Kidney Disease.   Georgiann Hahn gap 05/31/2015 7  5 - 15 Final  Hospital Outpatient Visit on 05/20/2015  Component Date Value Ref Range Status  . aPTT 05/20/2015 29  24 - 37 seconds Final  . WBC 05/20/2015 5.0  4.0 - 10.5 K/uL Final  . RBC 05/20/2015 5.10  3.87 -  5.11 MIL/uL Final  . Hemoglobin 05/20/2015 16.7* 12.0 - 15.0 g/dL Final  . HCT 05/20/2015 47.9* 36.0 - 46.0 % Final  . MCV 05/20/2015 93.9  78.0 - 100.0 fL Final  . MCH 05/20/2015 32.7  26.0 - 34.0 pg Final  . MCHC 05/20/2015 34.9  30.0 - 36.0 g/dL Final  . RDW 05/20/2015 12.8  11.5 - 15.5 % Final  . Platelets 05/20/2015 192  150 - 400 K/uL Final  . Sodium 05/20/2015 140  135 - 145 mmol/L Final  . Potassium 05/20/2015 3.9  3.5 - 5.1 mmol/L Final  . Chloride 05/20/2015 105  101 - 111 mmol/L Final  . CO2 05/20/2015 26  22 - 32 mmol/L Final  . Glucose, Bld 05/20/2015 183* 65 - 99 mg/dL Final  . BUN 05/20/2015 19  6 - 20 mg/dL Final  . Creatinine, Ser 05/20/2015 0.81  0.44 - 1.00 mg/dL Final  .  Calcium 05/20/2015 9.9  8.9 - 10.3 mg/dL Final  . Total Protein 05/20/2015 7.2  6.5 - 8.1 g/dL Final  . Albumin 05/20/2015 4.3  3.5 - 5.0 g/dL Final  . AST 05/20/2015 36  15 - 41 U/L Final  . ALT 05/20/2015 45  14 - 54 U/L Final  . Alkaline Phosphatase 05/20/2015 75  38 - 126 U/L Final  . Total Bilirubin 05/20/2015 1.1  0.3 - 1.2 mg/dL Final  . GFR calc non Af Amer 05/20/2015 >60  >60 mL/min Final  . GFR calc Af Amer 05/20/2015 >60  >60 mL/min Final   Comment: (NOTE) The eGFR has been calculated using the CKD EPI equation. This calculation has not been validated in all clinical situations. eGFR's persistently <60 mL/min signify possible Chronic Kidney Disease.   . Anion gap 05/20/2015 9  5 - 15 Final  . Prothrombin Time 05/20/2015 16.6* 11.6 - 15.2 seconds Final  . INR 05/20/2015 1.32  0.00 - 1.49 Final  . ABO/RH(D) 05/20/2015 A POS   Final  . Antibody Screen 05/20/2015 NEG   Final  . Sample Expiration 05/20/2015 06/02/2015   Final  . Color, Urine 05/20/2015 YELLOW  YELLOW Final  . APPearance 05/20/2015 CLOUDY* CLEAR Final  . Specific Gravity, Urine 05/20/2015 1.021  1.005 - 1.030 Final  . pH 05/20/2015 5.5  5.0 - 8.0 Final  . Glucose, UA 05/20/2015 NEGATIVE  NEGATIVE mg/dL Final  . Hgb urine dipstick 05/20/2015 NEGATIVE  NEGATIVE Final  . Bilirubin Urine 05/20/2015 MODERATE* NEGATIVE Final  . Ketones, ur 05/20/2015 NEGATIVE  NEGATIVE mg/dL Final  . Protein, ur 05/20/2015 NEGATIVE  NEGATIVE mg/dL Final  . Urobilinogen, UA 05/20/2015 0.2  0.0 - 1.0 mg/dL Final  . Nitrite 05/20/2015 NEGATIVE  NEGATIVE Final  . Leukocytes, UA 05/20/2015 LARGE* NEGATIVE Final  . MRSA, PCR 05/20/2015 NEGATIVE  NEGATIVE Final  . Staphylococcus aureus 05/20/2015 NEGATIVE  NEGATIVE Final   Comment:        The Xpert SA Assay (FDA approved for NASAL specimens in patients over 71 years of age), is one component of a comprehensive surveillance program.  Test performance has been validated by  [facility] for patients greater than or equal to 66 year old. It is not intended to diagnose infection nor to guide or monitor treatment.   . Squamous Epithelial / LPF 05/20/2015 MANY* RARE Final  . WBC, UA 05/20/2015 21-50  <3 WBC/hpf Final  . Bacteria, UA 05/20/2015 MANY* RARE Final     X-Rays:No results found.  EKG: Orders placed or performed in visit on 03/29/15  .  EKG 12-Lead     Hospital Course: Morgan Frye is a 64 y.o. who was admitted to [facility]. They were brought to the operating room on 05/30/2015 and underwent Procedure(s): LEFT TOTAL KNEE ARTHROPLASTY.  Patient tolerated the procedure well and was later transferred to the recovery room and then to the orthopaedic floor for postoperative care.  They were given PO and IV analgesics for pain control following their surgery.  They were given 24 hours of postoperative antibiotics of  Anti-infectives    Start     Dose/Rate Route Frequency Ordered Stop   05/30/15 2100  vancomycin (VANCOCIN) IVPB 1000 mg/200 mL premix     1,000 mg 200 mL/hr over 60 Minutes Intravenous Every 12 hours 05/30/15 1217 05/30/15 2258   05/30/15 0600  vancomycin (VANCOCIN) 1,500 mg in sodium chloride 0.9 % 500 mL IVPB    Comments:  Dose increased from 1g to 159m for weight > 100kg per protocol.   1,500 mg 250 mL/hr over 120 Minutes Intravenous On call to O.R. 05/29/15 1154 05/30/15 0958     and started on DVT prophylaxis in the form of Xarelto.   PT and OT were ordered for total joint protocol.  Discharge planning consulted to help with postop disposition and equipment needs.  Patient had a decent night on the evening of surgery.  They started to get up OOB with therapy on day one. Hemovac drain was pulled without difficulty.  Continued to work with therapy into day two.  Dressing was changed on day two and the incision was healing well.  Patient was seen in rounds and was ready to go home on day two.  Discharge home with home  health Diet - Cardiac diet Follow up - in 2 weeks Activity - WBAT Disposition - Home Condition Upon Discharge - Good D/C Meds - See DC Summary DVT Prophylaxis - Xarelto  Discharge Instructions    Call MD / Call 911    Complete by:  As directed   If you experience chest pain or shortness of breath, CALL 911 and be transported to the hospital emergency room.  If you develope a fever above 101 F, pus (white drainage) or increased drainage or redness at the wound, or calf pain, call your surgeon's office.     Change dressing    Complete by:  As directed   Change dressing daily with sterile 4 x 4 inch gauze dressing and apply TED hose. Do not submerge the incision under water.     Constipation Prevention    Complete by:  As directed   Drink plenty of fluids.  Prune juice may be helpful.  You may use a stool softener, such as Colace (over the counter) 100 mg twice a day.  Use MiraLax (over the counter) for constipation as needed.     Diet - low sodium heart healthy    Complete by:  As directed      Discharge instructions    Complete by:  As directed   Pick up stool softner and laxative for home use following surgery while on pain medications. Do not submerge incision under water. Please use good hand washing techniques while changing dressing each day. May shower starting three days after surgery. Please use a clean towel to pat the incision dry following showers. Continue to use ice for pain and swelling after surgery. Do not use any lotions or creams on the incision until instructed by your surgeon.  Take Xarelto for two and  a half more weeks, then discontinue Xarelto. Once the patient has completed the blood thinner regimen, then take a Baby 81 mg Aspirin daily for three more weeks.  Postoperative Constipation Protocol  Constipation - defined medically as fewer than three stools per week and severe constipation as less than one stool per week.  One of the most common issues patients  have following surgery is constipation.  Even if you have a regular bowel pattern at home, your normal regimen is likely to be disrupted due to multiple reasons following surgery.  Combination of anesthesia, postoperative narcotics, change in appetite and fluid intake all can affect your bowels.  In order to avoid complications following surgery, here are some recommendations in order to help you during your recovery period.  Colace (docusate) - Pick up an over-the-counter form of Colace or another stool softener and take twice a day as long as you are requiring postoperative pain medications.  Take with a full glass of water daily.  If you experience loose stools or diarrhea, hold the colace until you stool forms back up.  If your symptoms do not get better within 1 week or if they get worse, check with your doctor.  Dulcolax (bisacodyl) - Pick up over-the-counter and take as directed by the product packaging as needed to assist with the movement of your bowels.  Take with a full glass of water.  Use this product as needed if not relieved by Colace only.   MiraLax (polyethylene glycol) - Pick up over-the-counter to have on hand.  MiraLax is a solution that will increase the amount of water in your bowels to assist with bowel movements.  Take as directed and can mix with a glass of water, juice, soda, coffee, or tea.  Take if you go more than two days without a movement. Do not use MiraLax more than once per day. Call your doctor if you are still constipated or irregular after using this medication for 7 days in a row.  If you continue to have problems with postoperative constipation, please contact the office for further assistance and recommendations.  If you experience "the worst abdominal pain ever" or develop nausea or vomiting, please contact the office immediatly for further recommendations for treatment.     Do not put a pillow under the knee. Place it under the heel.    Complete by:  As directed       Do not sit on low chairs, stoools or toilet seats, as it may be difficult to get up from low surfaces    Complete by:  As directed      Driving restrictions    Complete by:  As directed   No driving until released by the physician.     Increase activity slowly as tolerated    Complete by:  As directed      Lifting restrictions    Complete by:  As directed   No lifting until released by the physician.     Patient may shower    Complete by:  As directed   You may shower without a dressing once there is no drainage.  Do not wash over the wound.  If drainage remains, do not shower until drainage stops.     TED hose    Complete by:  As directed   Use stockings (TED hose) for 3 weeks on both leg(s).  You may remove them at night for sleeping.     Weight bearing as tolerated  Complete by:  As directed   Laterality:  left  Extremity:  Lower            Medication List    STOP taking these medications        etodolac 400 MG tablet  Commonly known as:  LODINE      TAKE these medications        atorvastatin 20 MG tablet  Commonly known as:  LIPITOR  Take 20 mg by mouth daily.     lisinopril-hydrochlorothiazide 20-12.5 MG per tablet  Commonly known as:  PRINZIDE,ZESTORETIC  Take 1 tablet by mouth every morning.     methocarbamol 500 MG tablet  Commonly known as:  ROBAXIN  Take 1 tablet (500 mg total) by mouth every 6 (six) hours as needed for muscle spasms.     metoprolol succinate 50 MG 24 hr tablet  Commonly known as:  TOPROL-XL  Take 50 mg by mouth every evening. Take with or immediately following a meal.     oxyCODONE 5 MG immediate release tablet  Commonly known as:  Oxy IR/ROXICODONE  Take 1-2 tablets (5-10 mg total) by mouth every 3 (three) hours as needed for moderate pain, severe pain or breakthrough pain.     rivaroxaban 10 MG Tabs tablet  Commonly known as:  XARELTO  Take 1 tablet (10 mg total) by mouth daily with breakfast. Take Xarelto for two and a  half more weeks, then discontinue Xarelto. Once the patient has completed the blood thinner regimen, then take a Baby 81 mg Aspirin daily for three more weeks.     traMADol 50 MG tablet  Commonly known as:  ULTRAM  Take 1-2 tablets (50-100 mg total) by mouth every 6 (six) hours as needed (mild pain).           Follow-up Information    Follow up with [facility].   Why:  home health physical therapy   Contact information:   [place] [address] [phone]       Follow up with Gearlean Alf, MD. Schedule an appointment as soon as possible for a visit on 06/14/2015.   Specialty:  Orthopedic Surgery   Why:  Call office at [phone] to setup appointment on Tuesday 06/14/2015 with Dr. Wynelle Link.   Contact information:   [address] [place] [address] [phone]       Signed: Arlee Muslim, PA-C Orthopaedic Surgery 05/31/2015, 10:11 PM

## 2015-06-01 LAB — CBC
HCT: 33.8 % — ABNORMAL LOW (ref 36.0–46.0)
HEMOGLOBIN: 12.1 g/dL (ref 12.0–15.0)
MCH: 33.2 pg (ref 26.0–34.0)
MCHC: 35.8 g/dL (ref 30.0–36.0)
MCV: 92.9 fL (ref 78.0–100.0)
PLATELETS: 192 10*3/uL (ref 150–400)
RBC: 3.64 MIL/uL — ABNORMAL LOW (ref 3.87–5.11)
RDW: 13.1 % (ref 11.5–15.5)
WBC: 13.1 10*3/uL — ABNORMAL HIGH (ref 4.0–10.5)

## 2015-06-01 LAB — BASIC METABOLIC PANEL
Anion gap: 6 (ref 5–15)
BUN: 13 mg/dL (ref 6–20)
CALCIUM: 8.9 mg/dL (ref 8.9–10.3)
CO2: 27 mmol/L (ref 22–32)
CREATININE: 0.59 mg/dL (ref 0.44–1.00)
Chloride: 105 mmol/L (ref 101–111)
GLUCOSE: 200 mg/dL — AB (ref 65–99)
Potassium: 4.3 mmol/L (ref 3.5–5.1)
SODIUM: 138 mmol/L (ref 135–145)

## 2015-06-01 NOTE — Progress Notes (Signed)
   Subjective: 2 Days Post-Op Procedure(s) (LRB): LEFT TOTAL KNEE ARTHROPLASTY (Left) Patient reports pain as mild.   Patient seen in rounds with Dr. Wynelle Link. Patient is well, and has had no acute complaints or problems Patient is ready to go home   Objective: Vital signs in last 24 hours: Temp:  [98 F (36.7 C)-98.7 F (37.1 C)] 98.7 F (37.1 C) (08/03 0447) Pulse Rate:  [78-96] 84 (08/03 0447) Resp:  [18] 18 (08/03 0447) BP: (117-141)/(63-83) 117/63 mmHg (08/03 0447) SpO2:  [95 %-99 %] 99 % (08/03 0447)  Intake/Output from previous day:  Intake/Output Summary (Last 24 hours) at 06/01/15 0730 Last data filed at 06/01/15 0981  Gross per 24 hour  Intake 1423.33 ml  Output   1900 ml  Net -476.67 ml    Intake/Output this shift:    Labs:  Recent Labs  05/31/15 0417 06/01/15 0400  HGB 13.4 12.1    Recent Labs  05/31/15 0417 06/01/15 0400  WBC 12.8* 13.1*  RBC 4.15 3.64*  HCT 38.3 33.8*  PLT 192 192    Recent Labs  05/31/15 0417 06/01/15 0400  NA 135 138  K 4.1 4.3  CL 105 105  CO2 23 27  BUN 11 13  CREATININE 0.73 0.59  GLUCOSE 271* 200*  CALCIUM 8.5* 8.9   No results for input(s): LABPT, INR in the last 72 hours.  EXAM: General - Patient is Alert and Appropriate Extremity - Neurovascular intact Sensation intact distally Incision - clean, dry Motor Function - intact, moving foot and toes well on exam.   Assessment/Plan: 2 Days Post-Op Procedure(s) (LRB): LEFT TOTAL KNEE ARTHROPLASTY (Left) Procedure(s) (LRB): LEFT TOTAL KNEE ARTHROPLASTY (Left) Past Medical History  Diagnosis Date  . Hypertension   . History of kidney stones     x1 lithotripsy-has others not a bother at this time  . GERD (gastroesophageal reflux disease)     mild- uses Tums as needed  . Arthritis     osteoarthritis- knees, hips, rt. hip bursitis  . Transfusion history     '1981- s/p childbirth  . Hemangioma of liver     very small- evaluated and considered stable-  no further problems or follow up in many years  . History of palpitations     none since on Metoprolol.   Principal Problem:   OA (osteoarthritis) of knee  Estimated body mass index is 41.47 kg/(m^2) as calculated from the following:   Height as of this encounter: 5' 5.75" (1.67 m).   Weight as of this encounter: 115.667 kg (255 lb). Up with therapy Discharge home with home health Diet - Cardiac diet Follow up - in 2 weeks Activity - WBAT Disposition - Home Condition Upon Discharge - Good D/C Meds - See DC Summary DVT Prophylaxis - [place], PA-C Orthopaedic Surgery 06/01/2015, 7:30 AM

## 2015-06-01 NOTE — Progress Notes (Signed)
Physical Therapy Treatment Patient Details Name: Morgan Frye MRN: [id number] DOB: February 23, 1951 Today's Date: 06/01/2015    History of Present Illness L TKA    PT Comments    POD # 2 pt dressed and eager to go home.  Assisted with amb in hallway, practiced going up and down 2 steps and performed all supine TKR TE's following HEP handout.  Instructed on proper tech and freq followed by use of ICE. Pt ready for D/C to home.  Follow Up Recommendations  Home health PT;Supervision/Assistance - 24 hour     Equipment Recommendations       Recommendations for Other Services       Precautions / Restrictions      Mobility  Bed Mobility               General bed mobility comments: Pt OOB in recliner  Transfers Overall transfer level: Needs assistance Equipment used: Rolling walker (2 wheeled) Transfers: Sit to/from Stand Sit to Stand: Supervision         General transfer comment: increased time.  Good safety cognition and use of hands to steady self.  Ambulation/Gait Ambulation/Gait assistance: Supervision Ambulation Distance (Feet): 45 Feet Assistive device: Rolling walker (2 wheeled) Gait Pattern/deviations: Step-to pattern;Decreased stance time - left Gait velocity: increased time but tolerated increased amb distance   General Gait Details: decreased   Stairs Stairs: Yes Stairs assistance: Supervision Stair Management: Two rails;Step to pattern;Forwards Number of Stairs: 2 General stair comments: one initial VC on proper sequencing  Wheelchair Mobility    Modified Rankin (Stroke Patients Only)       Balance                                    Cognition Arousal/Alertness: Awake/alert Behavior During Therapy: WFL for tasks assessed/performed Overall Cognitive Status: Within Functional Limits for tasks assessed                      Exercises   Total Knee Replacement TE's 10 reps B LE ankle pumps 10 reps towel squeezes 10  reps knee presses 10 reps heel slides  10 reps SAQ's 10 reps SLR's 10 reps ABD Followed by ICE     General Comments        Pertinent Vitals/Pain Pain Assessment: 0-10 Pain Score: 3  Pain Location: L knee Pain Descriptors / Indicators: Aching;Sore Pain Intervention(s): Monitored during session;Premedicated before session;Repositioned;Ice applied    Home Living                      Prior Function            PT Goals (current goals can now be found in the care plan section) Progress towards PT goals: Progressing toward goals    Frequency  7X/week    PT Plan      Co-evaluation             End of Session Equipment Utilized During Treatment: Gait belt Activity Tolerance: Patient tolerated treatment well Patient left: in chair;with call bell/phone within reach;with family/visitor present     Time: 4315-4008 PT Time Calculation (min) (ACUTE ONLY): 30 min  Charges:  $Gait Training: 8-22 mins $Therapeutic Exercise: 8-22 mins                    G Codes:      Rica Koyanagi  PTA  [facility]  Acute  Rehab Pager      [phone]

## 2015-07-27 ENCOUNTER — Other Ambulatory Visit: Payer: Self-pay | Admitting: Family Medicine

## 2015-07-27 ENCOUNTER — Other Ambulatory Visit (HOSPITAL_COMMUNITY)
Admission: RE | Admit: 2015-07-27 | Discharge: 2015-07-27 | Disposition: A | Discharge status: Home / Self Care | Payer: BC Managed Care – PPO | Source: Ambulatory Visit | Attending: Family Medicine | Admitting: Family Medicine

## 2015-07-27 DIAGNOSIS — Z01411 Encounter for gynecological examination (general) (routine) with abnormal findings: Secondary | ICD-10-CM | POA: Insufficient documentation

## 2015-07-27 DIAGNOSIS — Z1151 Encounter for screening for human papillomavirus (HPV): Secondary | ICD-10-CM | POA: Insufficient documentation

## 2015-07-28 LAB — CYTOLOGY - PAP

## 2018-02-03 DIAGNOSIS — R7303 Prediabetes: Secondary | ICD-10-CM | POA: Diagnosis not present

## 2018-02-03 DIAGNOSIS — Z1211 Encounter for screening for malignant neoplasm of colon: Secondary | ICD-10-CM | POA: Diagnosis not present

## 2018-02-03 DIAGNOSIS — E785 Hyperlipidemia, unspecified: Secondary | ICD-10-CM | POA: Diagnosis not present

## 2018-02-03 DIAGNOSIS — I10 Essential (primary) hypertension: Secondary | ICD-10-CM | POA: Diagnosis not present

## 2018-02-21 DIAGNOSIS — K8020 Calculus of gallbladder without cholecystitis without obstruction: Secondary | ICD-10-CM | POA: Diagnosis not present

## 2018-02-21 DIAGNOSIS — N200 Calculus of kidney: Secondary | ICD-10-CM | POA: Diagnosis not present

## 2018-02-21 DIAGNOSIS — R1084 Generalized abdominal pain: Secondary | ICD-10-CM | POA: Diagnosis not present

## 2018-08-05 DIAGNOSIS — Z1231 Encounter for screening mammogram for malignant neoplasm of breast: Secondary | ICD-10-CM | POA: Diagnosis not present

## 2018-09-05 DIAGNOSIS — Z23 Encounter for immunization: Secondary | ICD-10-CM | POA: Diagnosis not present

## 2018-09-23 DIAGNOSIS — Z0000 Encounter for general adult medical examination without abnormal findings: Secondary | ICD-10-CM | POA: Diagnosis not present

## 2018-09-23 DIAGNOSIS — R7303 Prediabetes: Secondary | ICD-10-CM | POA: Diagnosis not present

## 2018-09-23 DIAGNOSIS — Z1211 Encounter for screening for malignant neoplasm of colon: Secondary | ICD-10-CM | POA: Diagnosis not present

## 2018-09-23 DIAGNOSIS — I10 Essential (primary) hypertension: Secondary | ICD-10-CM | POA: Diagnosis not present

## 2018-09-23 DIAGNOSIS — K760 Fatty (change of) liver, not elsewhere classified: Secondary | ICD-10-CM | POA: Diagnosis not present

## 2018-09-23 DIAGNOSIS — Z6841 Body Mass Index (BMI) 40.0 and over, adult: Secondary | ICD-10-CM | POA: Diagnosis not present

## 2018-09-23 DIAGNOSIS — Z23 Encounter for immunization: Secondary | ICD-10-CM | POA: Diagnosis not present

## 2018-09-23 DIAGNOSIS — E785 Hyperlipidemia, unspecified: Secondary | ICD-10-CM | POA: Diagnosis not present

## 2018-09-30 DIAGNOSIS — Z1211 Encounter for screening for malignant neoplasm of colon: Secondary | ICD-10-CM | POA: Diagnosis not present

## 2018-11-26 DIAGNOSIS — Z961 Presence of intraocular lens: Secondary | ICD-10-CM | POA: Diagnosis not present

## 2018-11-26 DIAGNOSIS — H43813 Vitreous degeneration, bilateral: Secondary | ICD-10-CM | POA: Diagnosis not present

## 2018-11-26 DIAGNOSIS — H5213 Myopia, bilateral: Secondary | ICD-10-CM | POA: Diagnosis not present

## 2018-11-26 DIAGNOSIS — E119 Type 2 diabetes mellitus without complications: Secondary | ICD-10-CM | POA: Diagnosis not present

## 2019-01-05 DIAGNOSIS — E119 Type 2 diabetes mellitus without complications: Secondary | ICD-10-CM | POA: Diagnosis not present

## 2019-03-19 DIAGNOSIS — E785 Hyperlipidemia, unspecified: Secondary | ICD-10-CM | POA: Diagnosis not present

## 2019-03-19 DIAGNOSIS — I10 Essential (primary) hypertension: Secondary | ICD-10-CM | POA: Diagnosis not present

## 2019-03-19 DIAGNOSIS — E119 Type 2 diabetes mellitus without complications: Secondary | ICD-10-CM | POA: Diagnosis not present

## 2019-03-24 DIAGNOSIS — I10 Essential (primary) hypertension: Secondary | ICD-10-CM | POA: Diagnosis not present

## 2019-03-24 DIAGNOSIS — E119 Type 2 diabetes mellitus without complications: Secondary | ICD-10-CM | POA: Diagnosis not present

## 2019-03-24 DIAGNOSIS — E785 Hyperlipidemia, unspecified: Secondary | ICD-10-CM | POA: Diagnosis not present

## 2019-03-24 DIAGNOSIS — Z6841 Body Mass Index (BMI) 40.0 and over, adult: Secondary | ICD-10-CM | POA: Diagnosis not present

## 2019-08-04 DIAGNOSIS — Z23 Encounter for immunization: Secondary | ICD-10-CM | POA: Diagnosis not present

## 2019-08-11 DIAGNOSIS — Z1231 Encounter for screening mammogram for malignant neoplasm of breast: Secondary | ICD-10-CM | POA: Diagnosis not present

## 2019-11-24 ENCOUNTER — Ambulatory Visit: Payer: BC Managed Care – PPO

## 2019-12-03 ENCOUNTER — Ambulatory Visit: Payer: BC Managed Care – PPO

## 2019-12-11 ENCOUNTER — Ambulatory Visit: Payer: BC Managed Care – PPO

## 2020-09-01 DIAGNOSIS — Z1231 Encounter for screening mammogram for malignant neoplasm of breast: Secondary | ICD-10-CM | POA: Diagnosis not present

## 2020-10-14 DIAGNOSIS — E119 Type 2 diabetes mellitus without complications: Secondary | ICD-10-CM | POA: Diagnosis not present

## 2020-10-14 DIAGNOSIS — E785 Hyperlipidemia, unspecified: Secondary | ICD-10-CM | POA: Diagnosis not present

## 2020-10-14 DIAGNOSIS — I10 Essential (primary) hypertension: Secondary | ICD-10-CM | POA: Diagnosis not present

## 2020-10-18 DIAGNOSIS — E785 Hyperlipidemia, unspecified: Secondary | ICD-10-CM | POA: Diagnosis not present

## 2020-10-18 DIAGNOSIS — I10 Essential (primary) hypertension: Secondary | ICD-10-CM | POA: Diagnosis not present

## 2020-10-18 DIAGNOSIS — E119 Type 2 diabetes mellitus without complications: Secondary | ICD-10-CM | POA: Diagnosis not present

## 2020-10-18 DIAGNOSIS — Z1211 Encounter for screening for malignant neoplasm of colon: Secondary | ICD-10-CM | POA: Diagnosis not present

## 2020-10-18 DIAGNOSIS — Z0000 Encounter for general adult medical examination without abnormal findings: Secondary | ICD-10-CM | POA: Diagnosis not present

## 2020-10-27 DIAGNOSIS — Z1211 Encounter for screening for malignant neoplasm of colon: Secondary | ICD-10-CM | POA: Diagnosis not present

## 2021-01-18 DIAGNOSIS — H43813 Vitreous degeneration, bilateral: Secondary | ICD-10-CM | POA: Diagnosis not present

## 2021-01-18 DIAGNOSIS — H524 Presbyopia: Secondary | ICD-10-CM | POA: Diagnosis not present

## 2021-01-18 DIAGNOSIS — Z961 Presence of intraocular lens: Secondary | ICD-10-CM | POA: Diagnosis not present

## 2021-01-18 DIAGNOSIS — E119 Type 2 diabetes mellitus without complications: Secondary | ICD-10-CM | POA: Diagnosis not present

## 2021-02-22 DIAGNOSIS — H43823 Vitreomacular adhesion, bilateral: Secondary | ICD-10-CM | POA: Diagnosis not present

## 2021-02-22 DIAGNOSIS — H43393 Other vitreous opacities, bilateral: Secondary | ICD-10-CM | POA: Diagnosis not present

## 2021-03-23 DIAGNOSIS — H43312 Vitreous membranes and strands, left eye: Secondary | ICD-10-CM | POA: Diagnosis not present

## 2021-03-23 DIAGNOSIS — H43392 Other vitreous opacities, left eye: Secondary | ICD-10-CM | POA: Diagnosis not present

## 2021-08-24 DIAGNOSIS — H43391 Other vitreous opacities, right eye: Secondary | ICD-10-CM | POA: Diagnosis not present

## 2021-08-24 DIAGNOSIS — H33331 Multiple defects of retina without detachment, right eye: Secondary | ICD-10-CM | POA: Diagnosis not present

## 2021-08-24 DIAGNOSIS — H35411 Lattice degeneration of retina, right eye: Secondary | ICD-10-CM | POA: Diagnosis not present

## 2021-08-24 DIAGNOSIS — H43311 Vitreous membranes and strands, right eye: Secondary | ICD-10-CM | POA: Diagnosis not present

## 2021-09-01 DIAGNOSIS — H31091 Other chorioretinal scars, right eye: Secondary | ICD-10-CM | POA: Diagnosis not present

## 2021-09-01 DIAGNOSIS — H43393 Other vitreous opacities, bilateral: Secondary | ICD-10-CM | POA: Diagnosis not present

## 2021-09-01 DIAGNOSIS — H33331 Multiple defects of retina without detachment, right eye: Secondary | ICD-10-CM | POA: Diagnosis not present

## 2021-09-29 DIAGNOSIS — H31091 Other chorioretinal scars, right eye: Secondary | ICD-10-CM | POA: Diagnosis not present

## 2021-11-09 DIAGNOSIS — Z1231 Encounter for screening mammogram for malignant neoplasm of breast: Secondary | ICD-10-CM | POA: Diagnosis not present

## 2021-11-16 DIAGNOSIS — Z124 Encounter for screening for malignant neoplasm of cervix: Secondary | ICD-10-CM | POA: Diagnosis not present

## 2021-11-16 DIAGNOSIS — Z01411 Encounter for gynecological examination (general) (routine) with abnormal findings: Secondary | ICD-10-CM | POA: Diagnosis not present

## 2021-11-16 DIAGNOSIS — Z01419 Encounter for gynecological examination (general) (routine) without abnormal findings: Secondary | ICD-10-CM | POA: Diagnosis not present

## 2021-11-16 DIAGNOSIS — Z6839 Body mass index (BMI) 39.0-39.9, adult: Secondary | ICD-10-CM | POA: Diagnosis not present

## 2021-11-16 DIAGNOSIS — E2839 Other primary ovarian failure: Secondary | ICD-10-CM | POA: Diagnosis not present

## 2021-11-29 DIAGNOSIS — Z0001 Encounter for general adult medical examination with abnormal findings: Secondary | ICD-10-CM | POA: Diagnosis not present

## 2021-11-29 DIAGNOSIS — E7800 Pure hypercholesterolemia, unspecified: Secondary | ICD-10-CM | POA: Diagnosis not present

## 2021-11-29 DIAGNOSIS — E119 Type 2 diabetes mellitus without complications: Secondary | ICD-10-CM | POA: Diagnosis not present

## 2021-11-29 DIAGNOSIS — Z79899 Other long term (current) drug therapy: Secondary | ICD-10-CM | POA: Diagnosis not present

## 2021-11-29 DIAGNOSIS — D1803 Hemangioma of intra-abdominal structures: Secondary | ICD-10-CM | POA: Diagnosis not present

## 2021-11-29 DIAGNOSIS — Z23 Encounter for immunization: Secondary | ICD-10-CM | POA: Diagnosis not present

## 2021-11-29 DIAGNOSIS — I10 Essential (primary) hypertension: Secondary | ICD-10-CM | POA: Diagnosis not present

## 2021-11-29 DIAGNOSIS — E1169 Type 2 diabetes mellitus with other specified complication: Secondary | ICD-10-CM | POA: Diagnosis not present

## 2021-11-29 DIAGNOSIS — K760 Fatty (change of) liver, not elsewhere classified: Secondary | ICD-10-CM | POA: Diagnosis not present

## 2021-11-29 DIAGNOSIS — M159 Polyosteoarthritis, unspecified: Secondary | ICD-10-CM | POA: Diagnosis not present

## 2021-11-30 DIAGNOSIS — Z1211 Encounter for screening for malignant neoplasm of colon: Secondary | ICD-10-CM | POA: Diagnosis not present

## 2021-12-07 DIAGNOSIS — Z780 Asymptomatic menopausal state: Secondary | ICD-10-CM | POA: Diagnosis not present

## 2021-12-07 DIAGNOSIS — M85852 Other specified disorders of bone density and structure, left thigh: Secondary | ICD-10-CM | POA: Diagnosis not present

## 2021-12-07 DIAGNOSIS — M85851 Other specified disorders of bone density and structure, right thigh: Secondary | ICD-10-CM | POA: Diagnosis not present

## 2022-01-24 DIAGNOSIS — H5203 Hypermetropia, bilateral: Secondary | ICD-10-CM | POA: Diagnosis not present

## 2022-01-24 DIAGNOSIS — Z961 Presence of intraocular lens: Secondary | ICD-10-CM | POA: Diagnosis not present

## 2022-01-24 DIAGNOSIS — E119 Type 2 diabetes mellitus without complications: Secondary | ICD-10-CM | POA: Diagnosis not present

## 2022-01-24 DIAGNOSIS — H524 Presbyopia: Secondary | ICD-10-CM | POA: Diagnosis not present

## 2022-03-05 DIAGNOSIS — E785 Hyperlipidemia, unspecified: Secondary | ICD-10-CM | POA: Diagnosis not present

## 2022-03-05 DIAGNOSIS — E1169 Type 2 diabetes mellitus with other specified complication: Secondary | ICD-10-CM | POA: Diagnosis not present

## 2022-07-03 DIAGNOSIS — R051 Acute cough: Secondary | ICD-10-CM | POA: Diagnosis not present

## 2022-07-03 DIAGNOSIS — U071 COVID-19: Secondary | ICD-10-CM | POA: Diagnosis not present

## 2022-08-24 DIAGNOSIS — Z23 Encounter for immunization: Secondary | ICD-10-CM | POA: Diagnosis not present

## 2022-08-29 DIAGNOSIS — N950 Postmenopausal bleeding: Secondary | ICD-10-CM | POA: Diagnosis not present

## 2022-09-04 ENCOUNTER — Telehealth: Payer: Self-pay | Admitting: *Deleted

## 2022-09-04 DIAGNOSIS — C541 Malignant neoplasm of endometrium: Secondary | ICD-10-CM | POA: Diagnosis not present

## 2022-09-04 NOTE — Telephone Encounter (Signed)
Spoke with the patient regarding the referral to GYN oncology. Patient scheduled as new patient with Dr Berline Lopes 11/14 at 9:45 am.  Patient given an arrival time of 9:15 am.  Explained to the patient the the doctor will perform a pelvic exam at this visit. Patient given the policy that no visitors under the 16 yrs are allowed in the [place]. Patient given the address/phone number for the clinic and that the center offers free valet service.

## 2022-09-06 ENCOUNTER — Telehealth: Payer: Self-pay

## 2022-09-06 ENCOUNTER — Encounter: Payer: Self-pay | Admitting: Gynecologic Oncology

## 2022-09-06 NOTE — Telephone Encounter (Signed)
I spoke to patient this morning, she called to see if her new pt appointment could be moved from 11/14-11/10. Per Morgan Frye. Pt was moved to Friday 11/10. Meaningful use updated.

## 2022-09-07 ENCOUNTER — Inpatient Hospital Stay (HOSPITAL_BASED_OUTPATIENT_CLINIC_OR_DEPARTMENT_OTHER): Payer: Medicare PPO | Admitting: Gynecologic Oncology

## 2022-09-07 ENCOUNTER — Other Ambulatory Visit: Payer: Self-pay

## 2022-09-07 ENCOUNTER — Encounter: Payer: Self-pay | Admitting: Gynecologic Oncology

## 2022-09-07 ENCOUNTER — Inpatient Hospital Stay: Payer: Medicare PPO | Attending: Gynecologic Oncology | Admitting: Gynecologic Oncology

## 2022-09-07 VITALS — BP 149/79 | HR 83 | Temp 98.5°F | Resp 18 | Ht 64.5 in | Wt 231.1 lb

## 2022-09-07 DIAGNOSIS — R32 Unspecified urinary incontinence: Secondary | ICD-10-CM | POA: Insufficient documentation

## 2022-09-07 DIAGNOSIS — R002 Palpitations: Secondary | ICD-10-CM | POA: Diagnosis not present

## 2022-09-07 DIAGNOSIS — Z780 Asymptomatic menopausal state: Secondary | ICD-10-CM | POA: Diagnosis not present

## 2022-09-07 DIAGNOSIS — E119 Type 2 diabetes mellitus without complications: Secondary | ICD-10-CM | POA: Insufficient documentation

## 2022-09-07 DIAGNOSIS — E669 Obesity, unspecified: Secondary | ICD-10-CM | POA: Diagnosis not present

## 2022-09-07 DIAGNOSIS — E1169 Type 2 diabetes mellitus with other specified complication: Secondary | ICD-10-CM | POA: Insufficient documentation

## 2022-09-07 DIAGNOSIS — D1809 Hemangioma of other sites: Secondary | ICD-10-CM | POA: Insufficient documentation

## 2022-09-07 DIAGNOSIS — I10 Essential (primary) hypertension: Secondary | ICD-10-CM | POA: Insufficient documentation

## 2022-09-07 DIAGNOSIS — M1990 Unspecified osteoarthritis, unspecified site: Secondary | ICD-10-CM | POA: Diagnosis not present

## 2022-09-07 DIAGNOSIS — C541 Malignant neoplasm of endometrium: Secondary | ICD-10-CM | POA: Diagnosis not present

## 2022-09-07 DIAGNOSIS — Z79899 Other long term (current) drug therapy: Secondary | ICD-10-CM | POA: Insufficient documentation

## 2022-09-07 DIAGNOSIS — Z6839 Body mass index (BMI) 39.0-39.9, adult: Secondary | ICD-10-CM | POA: Insufficient documentation

## 2022-09-07 MED ORDER — SENNOSIDES-DOCUSATE SODIUM 8.6-50 MG PO TABS: 2.0000 | ORAL_TABLET | Freq: Every day | ORAL | 0 refills | Status: DC

## 2022-09-07 MED ORDER — TRAMADOL HCL 50 MG PO TABS: 50.0000 mg | ORAL_TABLET | Freq: Four times a day (QID) | ORAL | 0 refills | Status: DC | PRN

## 2022-09-07 NOTE — Progress Notes (Addendum)
GYNECOLOGIC ONCOLOGY NEW PATIENT CONSULTATION   Patient Name: Morgan Frye  Patient Age: 71 y.o. Date of Service: 09/07/22 Referring Provider: Langley Gauss, DO  Primary Care Provider: Lujean Amel, MD Consulting Provider: Jeral Pinch, MD   Assessment/Plan:  Postmenopausal patient with grade 2 endometrial cancer.    We reviewed the nature of endometrial cancer and its recommended surgical staging, including total hysterectomy, bilateral salpingo-oophorectomy, and lymph node assessment. The patient is a suitable candidate for staging via a minimally invasive approach to surgery.  We reviewed that robotic assistance would be used to complete the surgery.   We discussed that most endometrial cancer is detected early and that decisions regarding adjuvant therapy will be made based on her final pathology.   Given her high risk histology, I recommend CT scan preoperatively to rule out metastatic disease.  We reviewed the sentinel lymph node technique. Risks and benefits of sentinel lymph node biopsy was reviewed. We reviewed the technique and ICG dye. The patient DOES NOT have an iodine allergy or known liver dysfunction. We reviewed the false negative rate (0.4%), and that 3% of patients with metastatic disease will not have it detected by SLN biopsy in endometrial cancer. A low risk of allergic reaction to the dye, <0.2% for ICG, has been reported. We also discussed that in the case of failed mapping, which occurs 40% of the time, a bilateral or unilateral lymphadenectomy will be performed at the surgeon's discretion.   Potential benefits of sentinel nodes including a higher detection rate for metastasis due to ultrastaging and potential reduction in operative morbidity. However, there remains uncertainty as to the role for treatment of micrometastatic disease. Further, the benefit of operative morbidity associated with the SLN technique in endometrial cancer is not yet completely known.  In other patient populations (e.g. the cervical cancer population) there has been observed reductions in morbidity with SLN biopsy compared to pelvic lymphadenectomy. Lymphedema, nerve dysfunction and lymphocysts are all potential risks with the SLN technique as with complete lymphadenectomy. Additional risks to the patient include the risk of damage to an internal organ while operating in an altered view (e.g. the black and white image of the robotic fluorescence imaging mode).   We discussed plan for a robotic assisted hysterectomy, bilateral salpingo-oophorectomy, sentinel lymph node evaluation, possible lymph node dissection, possible laparotomy. The risks of surgery were discussed in detail and she understands these to include infection; wound separation; hernia; vaginal cuff separation, injury to adjacent organs such as bowel, bladder, blood vessels, ureters and nerves; bleeding which may require blood transfusion; anesthesia risk; thromboembolic events; possible death; unforeseen complications; possible need for re-exploration; medical complications such as heart attack, stroke, pleural effusion and pneumonia; and, if full lymphadenectomy is performed the risk of lymphedema and lymphocyst. The patient will receive DVT and antibiotic prophylaxis as indicated. She voiced a clear understanding. She had the opportunity to ask questions. Perioperative instructions were reviewed with her. Prescriptions for post-op medications were sent to her pharmacy of choice.  Given history of diabetes with no hemoglobin A1c in the last 9 months, we will plan to get a hemoglobin A1c with her preoperative labs.  A copy of this note was sent to the patient's referring provider.   65 minutes of total time was spent for this patient encounter, including preparation, face-to-face counseling with the patient and coordination of care, and documentation of the encounter.  Jeral Pinch, MD  Division of Gynecologic Oncology   Department of Obstetrics and Gynecology  [facility]  ___________________________________________  Chief Complaint: No chief complaint on file.   History of Present Illness:  Morgan Frye is a 71 y.o. y.o. female who is seen in consultation at the request of Dr. Lanny Cramp for an evaluation of endometrial cancer.  The patient presented initially with postmenopausal spotting.  Patient reports that she began having very light spotting, barely noticeable.  That night, she had a little bit darker spotting noted on her pad.  She has been wearing a pad since having COVID with a lingering cough due to some urinary incontinence.    She called immediately and saw her OB/GYN. Endometrial biopsy on 09/04/2022 shows moderately differentiated endometrial adenocarcinoma.  She bled for about a week after her biopsy, denies any bleeding since.  Denies any cramping or pelvic pain.  She endorses a good appetite without nausea or emesis.  Reports normal bowel function.  Takes fiber regularly in the setting of her diabetes.  Denies any urinary symptoms.  Last hemoglobin A1c was in February, was 5.7%.  She checks her blood glucose at home and is normally in the 130s.  He has seen a cardiologist for history of palpitations that happen while she was going through menopause.  She was started on metoprolol and has not had any palpitations since.  She is retired, lives with her husband.  PAST MEDICAL HISTORY:  Past Medical History:  Diagnosis Date   Arthritis    osteoarthritis- knees, hips, rt. hip bursitis   Diabetes mellitus type 2 in obese ([place])    Hemangioma of liver    very small- evaluated and considered stable- no further problems or follow up in many years   History of kidney stones    x1 lithotripsy-has others not a bother at this time   History of palpitations    during menopause, on metoprolol. Saw cardiologist   Hypertension    Obesity (BMI 30-39.9)    Transfusion history     '1981- s/p childbirth     PAST SURGICAL HISTORY:  Past Surgical History:  Procedure Laterality Date   CATARACT EXTRACTION, BILATERAL Bilateral    5 yrs ago   [place] OF UTERUS  2010   perimenopause, caused bleeding, had polyp removed   LITHOTRIPSY     TONSILLECTOMY     child   TOTAL KNEE ARTHROPLASTY Right 05/24/2014   Procedure: RIGHT TOTAL KNEE ARTHROPLASTY;  Surgeon: Gearlean Alf, MD;  Location: [facility];  Service: Orthopedics;  Laterality: Right;   TOTAL KNEE ARTHROPLASTY Left 05/30/2015   Procedure: LEFT TOTAL KNEE ARTHROPLASTY;  Surgeon: Gaynelle Arabian, MD;  Location: [facility];  Service: Orthopedics;  Laterality: Left;    OB/GYN HISTORY:  OB History  Gravida Para Term Preterm AB Living  2 2          SAB IAB Ectopic Multiple Live Births               # Outcome Date GA Lbr Len/2nd Weight Sex Delivery Anes PTL Lv  2 Para           1 Para             No LMP recorded. Patient is postmenopausal.  Age at menarche: 65 Age at menopause: 41 Hx of HRT: Yes, was on HRT from age [age] for hot flashes Hx of STDs: Denies Last pap: 2019 History of abnormal pap smears: denies  SCREENING STUDIES:  Last mammogram: 2023  Last colonoscopy: 2009; does Cologuard yearly now, negative Last  bone mineral density: 2023  MEDICATIONS: Outpatient Encounter Medications as of 09/07/2022  Medication Sig   senna-docusate (SENOKOT-S) 8.6-50 MG tablet Take 2 tablets by mouth at bedtime. For AFTER surgery only, do not take if having diarrrhea   traMADol (ULTRAM) 50 MG tablet Take 1 tablet (50 mg total) by mouth every 6 (six) hours as needed for severe pain. For AFTER surgery only, do not take and drive   atorvastatin (LIPITOR) 20 MG tablet Take 20 mg by mouth daily.   lisinopril-hydrochlorothiazide (PRINZIDE,ZESTORETIC) 20-12.5 MG per tablet Take 1 tablet by mouth every morning.   metoprolol succinate (TOPROL-XL) 50 MG 24 hr tablet Take 50 mg by mouth every evening. Take with or  immediately following a meal.   No facility-administered encounter medications on file as of 09/07/2022.    ALLERGIES:  Allergies  Allergen Reactions   Acetaminophen Other (See Comments)    Patient states she had kidney issues after this, had "coke" colored urine   Penicillins Hives and Swelling     FAMILY HISTORY:  Family History  Problem Relation Age of Onset   Liver disease Mother    Heart disease Father        ICD.  Father is 81   Prostate cancer Father    Colon cancer Neg Hx    Breast cancer Neg Hx    Ovarian cancer Neg Hx    Endometrial cancer Neg Hx    Pancreatic cancer Neg Hx      SOCIAL HISTORY:  Social Connections: Not on file    REVIEW OF SYSTEMS:  Denies appetite changes, fevers, chills, fatigue, unexplained weight changes. Denies hearing loss, neck lumps or masses, mouth sores, ringing in ears or voice changes. Denies cough or wheezing.  Denies shortness of breath. Denies chest pain or palpitations. Denies leg swelling. Denies abdominal distention, pain, blood in stools, constipation, diarrhea, nausea, vomiting, or early satiety. Denies pain with intercourse, dysuria, frequency, hematuria or incontinence. Denies hot flashes, pelvic pain, vaginal bleeding or vaginal discharge.   Denies joint pain, back pain or muscle pain/cramps. Denies itching, rash, or wounds. Denies dizziness, headaches, numbness or seizures. Denies swollen lymph nodes or glands, denies easy bruising or bleeding. Denies anxiety, depression, confusion, or decreased concentration.  Physical Exam:  Vital Signs for this encounter:  Blood pressure (!) 149/79, pulse 83, temperature 98.5 F (36.9 C), temperature source Oral, resp. rate 18, height 5' 4.5" (1.638 m), weight 231 lb 2 oz (104.8 kg), SpO2 99 %. Body mass index is 39.06 kg/m. General: Alert, oriented, no acute distress.  HEENT: Normocephalic, atraumatic. Sclera anicteric.  Chest: Clear to auscultation bilaterally. No wheezes,  rhonchi, or rales. Cardiovascular: Regular rate and rhythm, no murmurs, rubs, or gallops.  Abdomen: Obese. Normoactive bowel sounds. Soft, nondistended, nontender to palpation. No masses or hepatosplenomegaly appreciated. No palpable fluid wave.  Extremities: Grossly normal range of motion. Warm, well perfused. No edema bilaterally.  Skin: No rashes or lesions.  Lymphatics: No cervical, supraclavicular, or inguinal adenopathy.  GU:  Normal external female genitalia. No lesions. No discharge or bleeding.             Bladder/urethra:  No lesions or masses, well supported bladder, somewhat prominent fold of vaginal tissue just below the urethra on the right             Vagina: Mildly atrophic, no lesions noted.             Cervix: Normal appearing, no lesions.  Uterus: Small, mobile, no parametrial involvement or nodularity.             Adnexa: No masses palpated.  Rectal: Deferred.  LABORATORY AND RADIOLOGIC DATA:  Outside medical records were reviewed to synthesize the above history, along with the history and physical obtained during the visit.

## 2022-09-07 NOTE — Patient Instructions (Signed)
Plan to have a CT scan prior to surgery and we will contact you with the results. You will need to arrive 2 hours before your CT scan to begin drinking oral contrast for the scan.   Preparing for your Surgery   Plan for surgery on October 03, 2022 with Dr. Jeral Pinch at [facility] will be scheduled for robotic assisted total laparoscopic hysterectomy (removal of the uterus and cervix), bilateral salpingo-oophorectomy (removal of both ovaries and fallopian tubes), sentinel lymph node biopsy, possible lymph node dissection, possible laparotomy (larger incision on your abdomen if needed).    Pre-operative Testing -You will receive a phone call from presurgical testing at [facility] to arrange for a pre-operative appointment and lab work.   -Bring your insurance card, copy of an advanced directive if applicable, medication list   -At that visit, you will be asked to sign a consent for a possible blood transfusion in case a transfusion becomes necessary during surgery.  The need for a blood transfusion is rare but having consent is a necessary part of your care.      -You should not be taking blood thinners or aspirin at least ten days prior to surgery unless instructed by your surgeon.   -Do not take supplements such as fish oil (omega 3), red yeast rice, turmeric before your surgery. You want to avoid medications with aspirin in them including headache powders such as BC or Goody's), Excedrin migraine.   Day Before Surgery at [place] will be asked to take in a light diet the day before surgery. You will be advised you can have clear liquids up until 3 hours before your surgery.     Eat a light diet the day before surgery.  Examples including soups, broths, toast, yogurt, mashed potatoes.  AVOID GAS PRODUCING FOODS. Things to avoid include carbonated beverages (fizzy beverages, sodas), raw fruits and raw vegetables (uncooked), or beans.    If your bowels are filled  with gas, your surgeon will have difficulty visualizing your pelvic organs which increases your surgical risks.   Your role in recovery Your role is to become active as soon as directed by your doctor, while still giving yourself time to heal.  Rest when you feel tired. You will be asked to do the following in order to speed your recovery:   - Cough and breathe deeply. This helps to clear and expand your lungs and can prevent pneumonia after surgery.  - [place]. Do mild physical activity. Walking or moving your legs help your circulation and body functions return to normal. Do not try to get up or walk alone the first time after surgery.   -If you develop swelling on one leg or the other, pain in the back of your leg, redness/warmth in one of your legs, please call the office or go to the Emergency Room to have a doppler to rule out a blood clot. For shortness of breath, chest pain-seek care in the Emergency Room as soon as possible. - Actively manage your pain. Managing your pain lets you move in comfort. We will ask you to rate your pain on a scale of zero to 10. It is your responsibility to tell your doctor or nurse where and how much you hurt so your pain can be treated.   Special Considerations -If you are diabetic, you may be placed on insulin after surgery to have closer control over your blood sugars to promote  healing and recovery.  This does not mean that you will be discharged on insulin.  If applicable, your oral antidiabetics will be resumed when you are tolerating a solid diet.   -Your final pathology results from surgery should be available around one week after surgery and the results will be relayed to you when available.   -FMLA forms can be faxed to [phone] and please allow 5-7 business days for completion.   Pain Management After Surgery -You have been prescribed your pain medication and bowel regimen medications before surgery so that you can have  these available when you are discharged from the hospital. The pain medication is for use ONLY AFTER surgery and a new prescription will not be given.    -Make sure that you have Tylenol and Ibuprofen IF YOU ARE ABLE TO TAKE THESE MEDICATIONS at home to use on a regular basis after surgery for pain control. We recommend alternating the medications every hour to six hours since they work differently and are processed in the body differently for pain relief.   -Review the attached handout on narcotic use and their risks and side effects.    Bowel Regimen -You have been prescribed Sennakot-S to take nightly to prevent constipation especially if you are taking the narcotic pain medication intermittently.  It is important to prevent constipation and drink adequate amounts of liquids. You can stop taking this medication when you are not taking pain medication and you are back on your normal bowel routine.   Risks of Surgery Risks of surgery are low but include bleeding, infection, damage to surrounding structures, re-operation, blood clots, and very rarely death.     Blood Transfusion Information (For the consent to be signed before surgery)   We will be checking your blood type before surgery so in case of emergencies, we will know what type of blood you would need.                                             WHAT IS A BLOOD TRANSFUSION?   A transfusion is the replacement of blood or some of its parts. Blood is made up of multiple cells which provide different functions. Red blood cells carry oxygen and are used for blood loss replacement. White blood cells fight against infection. Platelets control bleeding. Plasma helps clot blood. Other blood products are available for specialized needs, such as hemophilia or other clotting disorders. BEFORE THE TRANSFUSION  Who gives blood for transfusions?  You may be able to donate blood to be used at a later date on yourself (autologous  donation). Relatives can be asked to donate blood. This is generally not any safer than if you have received blood from a stranger. The same precautions are taken to ensure safety when a relative's blood is donated. Healthy volunteers who are fully evaluated to make sure their blood is safe. This is blood bank blood. Transfusion therapy is the safest it has ever been in the practice of medicine. Before blood is taken from a donor, a complete history is taken to make sure that person has no history of diseases nor engages in risky social behavior (examples are intravenous drug use or sexual activity with multiple partners). The donor's travel history is screened to minimize risk of transmitting infections, such as malaria. The donated blood is tested for signs of infectious diseases, such as  HIV and hepatitis. The blood is then tested to be sure it is compatible with you in order to minimize the chance of a transfusion reaction. If you or a relative donates blood, this is often done in anticipation of surgery and is not appropriate for emergency situations. It takes many days to process the donated blood. RISKS AND COMPLICATIONS Although transfusion therapy is very safe and saves many lives, the main dangers of transfusion include:  Getting an infectious disease. Developing a transfusion reaction. This is an allergic reaction to something in the blood you were given. Every precaution is taken to prevent this. The decision to have a blood transfusion has been considered carefully by your caregiver before blood is given. Blood is not given unless the benefits outweigh the risks.   AFTER SURGERY INSTRUCTIONS   Return to work: 4-6 weeks if applicable   Activity: 1. Be up and out of the bed during the day.  Take a nap if needed.  You may walk up steps but be careful and use the hand rail.  Stair climbing will tire you more than you think, you may need to stop part way and rest.    2. No lifting or  straining for 6 weeks over 10 pounds. No pushing, pulling, straining for 6 weeks.   3. No driving for around 1 week(s).  Do not drive if you are taking narcotic pain medicine and make sure that your reaction time has returned.    4. You can shower as soon as the next day after surgery. Shower daily.  Use your regular soap and water (not directly on the incision) and pat your incision(s) dry afterwards; don't rub.  No tub baths or submerging your body in water until cleared by your surgeon. If you have the soap that was given to you by pre-surgical testing that was used before surgery, you do not need to use it afterwards because this can irritate your incisions.    5. No sexual activity and nothing in the vagina for 8-10 weeks.   6. You may experience a small amount of clear drainage from your incisions, which is normal.  If the drainage persists, increases, or changes color please call the office.   7. Do not use creams, lotions, or ointments such as neosporin on your incisions after surgery until advised by your surgeon because they can cause removal of the dermabond glue on your incisions.     8. You may experience vaginal spotting after surgery or around the 6-8 week mark from surgery when the stitches at the top of the vagina begin to dissolve.  The spotting is normal but if you experience heavy bleeding, call our office.   9. Take Tylenol or ibuprofen first for pain if you are able to take these medications and only use narcotic pain medication for severe pain not relieved by the Tylenol or Ibuprofen.  Monitor your Tylenol intake to a max of 4,000 mg in a 24 hour period. You can alternate these medications after surgery.   Diet: 1. Low sodium Heart Healthy Diet is recommended but you are cleared to resume your normal (before surgery) diet after your procedure.   2. It is safe to use a laxative, such as Miralax or Colace, if you have difficulty moving your bowels. You have been prescribed  Sennakot-S to take at bedtime every evening after surgery to keep bowel movements regular and to prevent constipation.     Wound Care: 1. Keep clean and dry.  Shower daily.   Reasons to call the Doctor: Fever - Oral temperature greater than 100.4 degrees Fahrenheit Foul-smelling vaginal discharge Difficulty urinating Nausea and vomiting Increased pain at the site of the incision that is unrelieved with pain medicine. Difficulty breathing with or without chest pain New calf pain especially if only on one side Sudden, continuing increased vaginal bleeding with or without clots.   Contacts: For questions or concerns you should contact:   Dr. Jeral Pinch at [phone]   [name], NP at [phone]   After Hours: call [phone] and have the GYN Oncologist paged/contacted (after 5 pm or on the weekends).   Messages sent via mychart are for non-urgent matters and are not responded to after hours so for urgent needs, please call the after hours number.

## 2022-09-07 NOTE — H&P (View-Only) (Signed)
GYNECOLOGIC ONCOLOGY NEW PATIENT CONSULTATION   Patient Name: Morgan Frye  Patient Age: 71 y.o. Date of Service: 09/07/22 Referring Provider: Langley Gauss, DO  Primary Care Provider: Lujean Amel, MD Consulting Provider: Jeral Pinch, MD   Assessment/Plan:  Postmenopausal patient with grade 2 endometrial cancer.    We reviewed the nature of endometrial cancer and its recommended surgical staging, including total hysterectomy, bilateral salpingo-oophorectomy, and lymph node assessment. The patient is a suitable candidate for staging via a minimally invasive approach to surgery.  We reviewed that robotic assistance would be used to complete the surgery.   We discussed that most endometrial cancer is detected early and that decisions regarding adjuvant therapy will be made based on her final pathology.   Given her high risk histology, I recommend CT scan preoperatively to rule out metastatic disease.  We reviewed the sentinel lymph node technique. Risks and benefits of sentinel lymph node biopsy was reviewed. We reviewed the technique and ICG dye. The patient DOES NOT have an iodine allergy or known liver dysfunction. We reviewed the false negative rate (0.4%), and that 3% of patients with metastatic disease will not have it detected by SLN biopsy in endometrial cancer. A low risk of allergic reaction to the dye, <0.2% for ICG, has been reported. We also discussed that in the case of failed mapping, which occurs 40% of the time, a bilateral or unilateral lymphadenectomy will be performed at the surgeon's discretion.   Potential benefits of sentinel nodes including a higher detection rate for metastasis due to ultrastaging and potential reduction in operative morbidity. However, there remains uncertainty as to the role for treatment of micrometastatic disease. Further, the benefit of operative morbidity associated with the SLN technique in endometrial cancer is not yet completely known.  In other patient populations (e.g. the cervical cancer population) there has been observed reductions in morbidity with SLN biopsy compared to pelvic lymphadenectomy. Lymphedema, nerve dysfunction and lymphocysts are all potential risks with the SLN technique as with complete lymphadenectomy. Additional risks to the patient include the risk of damage to an internal organ while operating in an altered view (e.g. the black and white image of the robotic fluorescence imaging mode).   We discussed plan for a robotic assisted hysterectomy, bilateral salpingo-oophorectomy, sentinel lymph node evaluation, possible lymph node dissection, possible laparotomy. The risks of surgery were discussed in detail and she understands these to include infection; wound separation; hernia; vaginal cuff separation, injury to adjacent organs such as bowel, bladder, blood vessels, ureters and nerves; bleeding which may require blood transfusion; anesthesia risk; thromboembolic events; possible death; unforeseen complications; possible need for re-exploration; medical complications such as heart attack, stroke, pleural effusion and pneumonia; and, if full lymphadenectomy is performed the risk of lymphedema and lymphocyst. The patient will receive DVT and antibiotic prophylaxis as indicated. She voiced a clear understanding. She had the opportunity to ask questions. Perioperative instructions were reviewed with her. Prescriptions for post-op medications were sent to her pharmacy of choice.  Given history of diabetes with no hemoglobin A1c in the last 9 months, we will plan to get a hemoglobin A1c with her preoperative labs.  A copy of this note was sent to the patient's referring provider.   65 minutes of total time was spent for this patient encounter, including preparation, face-to-face counseling with the patient and coordination of care, and documentation of the encounter.  Jeral Pinch, MD  Division of Gynecologic Oncology   Department of Obstetrics and Gynecology  [facility]  ___________________________________________  Chief Complaint: No chief complaint on file.   History of Present Illness:  Morgan Frye is a 71 y.o. y.o. female who is seen in consultation at the request of Dr. Lanny Cramp for an evaluation of endometrial cancer.  The patient presented initially with postmenopausal spotting.  Patient reports that she began having very light spotting, barely noticeable.  That night, she had a little bit darker spotting noted on her pad.  She has been wearing a pad since having COVID with a lingering cough due to some urinary incontinence.    She called immediately and saw her OB/GYN. Endometrial biopsy on 09/04/2022 shows moderately differentiated endometrial adenocarcinoma.  She bled for about a week after her biopsy, denies any bleeding since.  Denies any cramping or pelvic pain.  She endorses a good appetite without nausea or emesis.  Reports normal bowel function.  Takes fiber regularly in the setting of her diabetes.  Denies any urinary symptoms.  Last hemoglobin A1c was in February, was 5.7%.  She checks her blood glucose at home and is normally in the 130s.  He has seen a cardiologist for history of palpitations that happen while she was going through menopause.  She was started on metoprolol and has not had any palpitations since.  She is retired, lives with her husband.  PAST MEDICAL HISTORY:  Past Medical History:  Diagnosis Date   Arthritis    osteoarthritis- knees, hips, rt. hip bursitis   Diabetes mellitus type 2 in obese ([place])    Hemangioma of liver    very small- evaluated and considered stable- no further problems or follow up in many years   History of kidney stones    x1 lithotripsy-has others not a bother at this time   History of palpitations    during menopause, on metoprolol. Saw cardiologist   Hypertension    Obesity (BMI 30-39.9)    Transfusion history     '1981- s/p childbirth     PAST SURGICAL HISTORY:  Past Surgical History:  Procedure Laterality Date   CATARACT EXTRACTION, BILATERAL Bilateral    5 yrs ago   [place] OF UTERUS  2010   perimenopause, caused bleeding, had polyp removed   LITHOTRIPSY     TONSILLECTOMY     child   TOTAL KNEE ARTHROPLASTY Right 05/24/2014   Procedure: RIGHT TOTAL KNEE ARTHROPLASTY;  Surgeon: Gearlean Alf, MD;  Location: [facility];  Service: Orthopedics;  Laterality: Right;   TOTAL KNEE ARTHROPLASTY Left 05/30/2015   Procedure: LEFT TOTAL KNEE ARTHROPLASTY;  Surgeon: Gaynelle Arabian, MD;  Location: [facility];  Service: Orthopedics;  Laterality: Left;    OB/GYN HISTORY:  OB History  Gravida Para Term Preterm AB Living  2 2          SAB IAB Ectopic Multiple Live Births               # Outcome Date GA Lbr Len/2nd Weight Sex Delivery Anes PTL Lv  2 Para           1 Para             No LMP recorded. Patient is postmenopausal.  Age at menarche: 31 Age at menopause: 66 Hx of HRT: Yes, was on HRT from age [age] for hot flashes Hx of STDs: Denies Last pap: 2019 History of abnormal pap smears: denies  SCREENING STUDIES:  Last mammogram: 2023  Last colonoscopy: 2009; does Cologuard yearly now, negative Last  bone mineral density: 2023  MEDICATIONS: Outpatient Encounter Medications as of 09/07/2022  Medication Sig   senna-docusate (SENOKOT-S) 8.6-50 MG tablet Take 2 tablets by mouth at bedtime. For AFTER surgery only, do not take if having diarrrhea   traMADol (ULTRAM) 50 MG tablet Take 1 tablet (50 mg total) by mouth every 6 (six) hours as needed for severe pain. For AFTER surgery only, do not take and drive   atorvastatin (LIPITOR) 20 MG tablet Take 20 mg by mouth daily.   lisinopril-hydrochlorothiazide (PRINZIDE,ZESTORETIC) 20-12.5 MG per tablet Take 1 tablet by mouth every morning.   metoprolol succinate (TOPROL-XL) 50 MG 24 hr tablet Take 50 mg by mouth every evening. Take with or  immediately following a meal.   No facility-administered encounter medications on file as of 09/07/2022.    ALLERGIES:  Allergies  Allergen Reactions   Acetaminophen Other (See Comments)    Patient states she had kidney issues after this, had "coke" colored urine   Penicillins Hives and Swelling     FAMILY HISTORY:  Family History  Problem Relation Age of Onset   Liver disease Mother    Heart disease Father        ICD.  Father is 76   Prostate cancer Father    Colon cancer Neg Hx    Breast cancer Neg Hx    Ovarian cancer Neg Hx    Endometrial cancer Neg Hx    Pancreatic cancer Neg Hx      SOCIAL HISTORY:  Social Connections: Not on file    REVIEW OF SYSTEMS:  Denies appetite changes, fevers, chills, fatigue, unexplained weight changes. Denies hearing loss, neck lumps or masses, mouth sores, ringing in ears or voice changes. Denies cough or wheezing.  Denies shortness of breath. Denies chest pain or palpitations. Denies leg swelling. Denies abdominal distention, pain, blood in stools, constipation, diarrhea, nausea, vomiting, or early satiety. Denies pain with intercourse, dysuria, frequency, hematuria or incontinence. Denies hot flashes, pelvic pain, vaginal bleeding or vaginal discharge.   Denies joint pain, back pain or muscle pain/cramps. Denies itching, rash, or wounds. Denies dizziness, headaches, numbness or seizures. Denies swollen lymph nodes or glands, denies easy bruising or bleeding. Denies anxiety, depression, confusion, or decreased concentration.  Physical Exam:  Vital Signs for this encounter:  Blood pressure (!) 149/79, pulse 83, temperature 98.5 F (36.9 C), temperature source Oral, resp. rate 18, height 5' 4.5" (1.638 m), weight 231 lb 2 oz (104.8 kg), SpO2 99 %. Body mass index is 39.06 kg/m. General: Alert, oriented, no acute distress.  HEENT: Normocephalic, atraumatic. Sclera anicteric.  Chest: Clear to auscultation bilaterally. No wheezes,  rhonchi, or rales. Cardiovascular: Regular rate and rhythm, no murmurs, rubs, or gallops.  Abdomen: Obese. Normoactive bowel sounds. Soft, nondistended, nontender to palpation. No masses or hepatosplenomegaly appreciated. No palpable fluid wave.  Extremities: Grossly normal range of motion. Warm, well perfused. No edema bilaterally.  Skin: No rashes or lesions.  Lymphatics: No cervical, supraclavicular, or inguinal adenopathy.  GU:  Normal external female genitalia. No lesions. No discharge or bleeding.             Bladder/urethra:  No lesions or masses, well supported bladder, somewhat prominent fold of vaginal tissue just below the urethra on the right             Vagina: Mildly atrophic, no lesions noted.             Cervix: Normal appearing, no lesions.  Uterus: Small, mobile, no parametrial involvement or nodularity.             Adnexa: No masses palpated.  Rectal: Deferred.  LABORATORY AND RADIOLOGIC DATA:  Outside medical records were reviewed to synthesize the above history, along with the history and physical obtained during the visit.

## 2022-09-07 NOTE — Progress Notes (Signed)
Patient here for new patient consultation with Dr. Jeral Pinch and for a pre-operative appointment prior to her scheduled surgery on October 03, 2022. She is scheduled for robotic assisted total laparoscopic hysterectomy, bilateral salpingo-oophorectomy, sentinel lymph node biopsy, possible lymph node dissection, possible laparotomy. The surgery was discussed in detail.  See after visit summary for additional details.       Discussed post-op pain management in detail including the aspects of the enhanced recovery pathway.  Advised her that a new prescription would be sent in for tramadol and it is only to be used for after her upcoming surgery.  Also prescribed sennakot to be used after surgery and to hold if having loose stools.  Discussed bowel regimen in detail.     Discussed measures to take at home to prevent DVT including frequent mobility.  Reportable signs and symptoms of DVT discussed. Post-operative instructions discussed and expectations for after surgery. Incisional care discussed as well including reportable signs and symptoms including erythema, drainage, wound separation.     5 minutes spent with the patient.  Verbalizing understanding of material discussed. No needs or concerns voiced at the end of the visit.   Advised patient to call for any needs.  Advised that her post-operative medications had been prescribed and could be picked up at any time.    This appointment is included in the global surgical bundle as pre-operative teaching and has no charge.

## 2022-09-07 NOTE — Patient Instructions (Signed)
Plan to have a CT scan prior to surgery and we will contact you with the results. You will need to arrive 2 hours before your CT scan to begin drinking oral contrast for the scan.  Preparing for your Surgery  Plan for surgery on October 03, 2022 with Dr. Jeral Pinch at [facility] will be scheduled for robotic assisted total laparoscopic hysterectomy (removal of the uterus and cervix), bilateral salpingo-oophorectomy (removal of both ovaries and fallopian tubes), sentinel lymph node biopsy, possible lymph node dissection, possible laparotomy (larger incision on your abdomen if needed).   Pre-operative Testing -You will receive a phone call from presurgical testing at [facility] to arrange for a pre-operative appointment and lab work.  -Bring your insurance card, copy of an advanced directive if applicable, medication list  -At that visit, you will be asked to sign a consent for a possible blood transfusion in case a transfusion becomes necessary during surgery.  The need for a blood transfusion is rare but having consent is a necessary part of your care.     -You should not be taking blood thinners or aspirin at least ten days prior to surgery unless instructed by your surgeon.  -Do not take supplements such as fish oil (omega 3), red yeast rice, turmeric before your surgery. You want to avoid medications with aspirin in them including headache powders such as BC or Goody's), Excedrin migraine.  Day Before Surgery at [place] will be asked to take in a light diet the day before surgery. You will be advised you can have clear liquids up until 3 hours before your surgery.    Eat a light diet the day before surgery.  Examples including soups, broths, toast, yogurt, mashed potatoes.  AVOID GAS PRODUCING FOODS. Things to avoid include carbonated beverages (fizzy beverages, sodas), raw fruits and raw vegetables (uncooked), or beans.   If your bowels are filled with gas,  your surgeon will have difficulty visualizing your pelvic organs which increases your surgical risks.  Your role in recovery Your role is to become active as soon as directed by your doctor, while still giving yourself time to heal.  Rest when you feel tired. You will be asked to do the following in order to speed your recovery:  - Cough and breathe deeply. This helps to clear and expand your lungs and can prevent pneumonia after surgery.  - [place]. Do mild physical activity. Walking or moving your legs help your circulation and body functions return to normal. Do not try to get up or walk alone the first time after surgery.   -If you develop swelling on one leg or the other, pain in the back of your leg, redness/warmth in one of your legs, please call the office or go to the Emergency Room to have a doppler to rule out a blood clot. For shortness of breath, chest pain-seek care in the Emergency Room as soon as possible. - Actively manage your pain. Managing your pain lets you move in comfort. We will ask you to rate your pain on a scale of zero to 10. It is your responsibility to tell your doctor or nurse where and how much you hurt so your pain can be treated.  Special Considerations -If you are diabetic, you may be placed on insulin after surgery to have closer control over your blood sugars to promote healing and recovery.  This does not mean that you will be discharged  on insulin.  If applicable, your oral antidiabetics will be resumed when you are tolerating a solid diet.  -Your final pathology results from surgery should be available around one week after surgery and the results will be relayed to you when available.  -FMLA forms can be faxed to [phone] and please allow 5-7 business days for completion.  Pain Management After Surgery -You have been prescribed your pain medication and bowel regimen medications before surgery so that you can have these available  when you are discharged from the hospital. The pain medication is for use ONLY AFTER surgery and a new prescription will not be given.   -Make sure that you have Tylenol and Ibuprofen IF YOU ARE ABLE TO TAKE THESE MEDICATIONS at home to use on a regular basis after surgery for pain control. We recommend alternating the medications every hour to six hours since they work differently and are processed in the body differently for pain relief.  -Review the attached handout on narcotic use and their risks and side effects.   Bowel Regimen -You have been prescribed Sennakot-S to take nightly to prevent constipation especially if you are taking the narcotic pain medication intermittently.  It is important to prevent constipation and drink adequate amounts of liquids. You can stop taking this medication when you are not taking pain medication and you are back on your normal bowel routine.  Risks of Surgery Risks of surgery are low but include bleeding, infection, damage to surrounding structures, re-operation, blood clots, and very rarely death.   Blood Transfusion Information (For the consent to be signed before surgery)  We will be checking your blood type before surgery so in case of emergencies, we will know what type of blood you would need.                                            WHAT IS A BLOOD TRANSFUSION?  A transfusion is the replacement of blood or some of its parts. Blood is made up of multiple cells which provide different functions. Red blood cells carry oxygen and are used for blood loss replacement. White blood cells fight against infection. Platelets control bleeding. Plasma helps clot blood. Other blood products are available for specialized needs, such as hemophilia or other clotting disorders. BEFORE THE TRANSFUSION  Who gives blood for transfusions?  You may be able to donate blood to be used at a later date on yourself (autologous donation). Relatives can be asked to  donate blood. This is generally not any safer than if you have received blood from a stranger. The same precautions are taken to ensure safety when a relative's blood is donated. Healthy volunteers who are fully evaluated to make sure their blood is safe. This is blood bank blood. Transfusion therapy is the safest it has ever been in the practice of medicine. Before blood is taken from a donor, a complete history is taken to make sure that person has no history of diseases nor engages in risky social behavior (examples are intravenous drug use or sexual activity with multiple partners). The donor's travel history is screened to minimize risk of transmitting infections, such as malaria. The donated blood is tested for signs of infectious diseases, such as HIV and hepatitis. The blood is then tested to be sure it is compatible with you in order to minimize the chance of a transfusion  reaction. If you or a relative donates blood, this is often done in anticipation of surgery and is not appropriate for emergency situations. It takes many days to process the donated blood. RISKS AND COMPLICATIONS Although transfusion therapy is very safe and saves many lives, the main dangers of transfusion include:  Getting an infectious disease. Developing a transfusion reaction. This is an allergic reaction to something in the blood you were given. Every precaution is taken to prevent this. The decision to have a blood transfusion has been considered carefully by your caregiver before blood is given. Blood is not given unless the benefits outweigh the risks.  AFTER SURGERY INSTRUCTIONS  Return to work: 4-6 weeks if applicable  Activity: 1. Be up and out of the bed during the day.  Take a nap if needed.  You may walk up steps but be careful and use the hand rail.  Stair climbing will tire you more than you think, you may need to stop part way and rest.   2. No lifting or straining for 6 weeks over 10 pounds. No pushing,  pulling, straining for 6 weeks.  3. No driving for around 1 week(s).  Do not drive if you are taking narcotic pain medicine and make sure that your reaction time has returned.   4. You can shower as soon as the next day after surgery. Shower daily.  Use your regular soap and water (not directly on the incision) and pat your incision(s) dry afterwards; don't rub.  No tub baths or submerging your body in water until cleared by your surgeon. If you have the soap that was given to you by pre-surgical testing that was used before surgery, you do not need to use it afterwards because this can irritate your incisions.   5. No sexual activity and nothing in the vagina for 8-10 weeks.  6. You may experience a small amount of clear drainage from your incisions, which is normal.  If the drainage persists, increases, or changes color please call the office.  7. Do not use creams, lotions, or ointments such as neosporin on your incisions after surgery until advised by your surgeon because they can cause removal of the dermabond glue on your incisions.    8. You may experience vaginal spotting after surgery or around the 6-8 week mark from surgery when the stitches at the top of the vagina begin to dissolve.  The spotting is normal but if you experience heavy bleeding, call our office.  9. Take Tylenol or ibuprofen first for pain if you are able to take these medications and only use narcotic pain medication for severe pain not relieved by the Tylenol or Ibuprofen.  Monitor your Tylenol intake to a max of 4,000 mg in a 24 hour period. You can alternate these medications after surgery.  Diet: 1. Low sodium Heart Healthy Diet is recommended but you are cleared to resume your normal (before surgery) diet after your procedure.  2. It is safe to use a laxative, such as Miralax or Colace, if you have difficulty moving your bowels. You have been prescribed Sennakot-S to take at bedtime every evening after surgery to  keep bowel movements regular and to prevent constipation.    Wound Care: 1. Keep clean and dry.  Shower daily.  Reasons to call the Doctor: Fever - Oral temperature greater than 100.4 degrees Fahrenheit Foul-smelling vaginal discharge Difficulty urinating Nausea and vomiting Increased pain at the site of the incision that is unrelieved with pain  medicine. Difficulty breathing with or without chest pain New calf pain especially if only on one side Sudden, continuing increased vaginal bleeding with or without clots.   Contacts: For questions or concerns you should contact:  Dr. Jeral Pinch at [phone]  [name], NP at [phone]  After Hours: call [phone] and have the GYN Oncologist paged/contacted (after 5 pm or on the weekends).  Messages sent via mychart are for non-urgent matters and are not responded to after hours so for urgent needs, please call the after hours number.

## 2022-09-11 ENCOUNTER — Ambulatory Visit: Payer: Self-pay | Admitting: Gynecologic Oncology

## 2022-09-13 ENCOUNTER — Inpatient Hospital Stay: Payer: Medicare PPO | Admitting: Licensed Clinical Social Worker

## 2022-09-13 NOTE — Progress Notes (Signed)
[place] Work  Initial Assessment   Morgan Frye is a 71 y.o. year old female contacted by phone. Clinical Social Work was referred by new patient protocol for assessment of psychosocial needs.   SDOH (Social Determinants of Health) assessments performed: Yes SDOH Interventions    Flowsheet Row Clinical Support from 09/13/2022 in [facility]  SDOH Interventions   Food Insecurity Interventions Intervention Not Indicated  Housing Interventions Intervention Not Indicated  Transportation Interventions Intervention Not Indicated  Financial Strain Interventions Intervention Not Indicated       SDOH Screenings   Food Insecurity: No Food Insecurity (09/13/2022)  Housing: Low Risk  (09/13/2022)  Transportation Needs: No Transportation Needs (09/13/2022)  Financial Resource Strain: Low Risk  (09/13/2022)  Tobacco Use: Low Risk  (09/07/2022)     Distress Screen completed: No     No data to display            Family/Social Information:  Housing Arrangement: patient lives with spouse Family members/support persons in your life? Family and Friends- multiple groups of supportive friends Transportation concerns: no  Employment: Retired .  Income source: retirement savings and income Financial concerns: No Type of concern: None Food access concerns: no Religious or spiritual practice: Not known Services Currently in place:  n/a  Coping/ Adjustment to diagnosis: Patient understands treatment plan and what happens next? Yes. The whole process from symptoms to diagnosis to plan for surgery has moved quickly per pt, but she understands next steps. She has friends who have had hysterectomies and are helping her understand what to expect Patient reported stressors: Adjusting to my illness Current coping skills/ strengths: Ability for insight , Capable of independent living , Motivation for treatment/growth , and Supportive family/friends      SUMMARY: Current SDOH Barriers:  None noted today  Clinical Social Work Clinical Goal(s):  No clinical social work goals at this time  Interventions: Discussed the importance of support during treatment Informed patient of the support team roles and support services at [facility] Provided Morgan Frye contact information and encouraged patient to call with any questions or concerns   Follow Up Plan: Patient will contact CSW with any support or resource needs Patient verbalizes understanding of plan: Yes    Morgan Frye E Morgan Tindall, LCSW

## 2022-09-14 ENCOUNTER — Telehealth: Payer: Self-pay | Admitting: *Deleted

## 2022-09-14 NOTE — Telephone Encounter (Signed)
Spoke with the patient and explained to arrive 2 hours earlier than her CT scan to drink the oral contrast. Patient verbalized understand

## 2022-09-17 ENCOUNTER — Encounter (HOSPITAL_COMMUNITY): Payer: Self-pay

## 2022-09-17 ENCOUNTER — Ambulatory Visit (HOSPITAL_COMMUNITY)
Admission: RE | Admit: 2022-09-17 | Discharge: 2022-09-17 | Disposition: A | Discharge status: Home / Self Care | Payer: Medicare PPO | Source: Ambulatory Visit | Attending: Gynecologic Oncology | Admitting: Gynecologic Oncology

## 2022-09-17 DIAGNOSIS — N200 Calculus of kidney: Secondary | ICD-10-CM | POA: Diagnosis not present

## 2022-09-17 DIAGNOSIS — N281 Cyst of kidney, acquired: Secondary | ICD-10-CM | POA: Diagnosis not present

## 2022-09-17 DIAGNOSIS — C541 Malignant neoplasm of endometrium: Secondary | ICD-10-CM | POA: Diagnosis not present

## 2022-09-17 DIAGNOSIS — K8020 Calculus of gallbladder without cholecystitis without obstruction: Secondary | ICD-10-CM | POA: Diagnosis not present

## 2022-09-17 DIAGNOSIS — C539 Malignant neoplasm of cervix uteri, unspecified: Secondary | ICD-10-CM | POA: Diagnosis not present

## 2022-09-17 LAB — POCT I-STAT CREATININE: Creatinine, Ser: 0.7 mg/dL (ref 0.44–1.00)

## 2022-09-17 MED ORDER — IOHEXOL 300 MG/ML  SOLN
100.0000 mL | Freq: Once | INTRAMUSCULAR | Status: AC | PRN
  Administered 2022-09-17: 100 mL via INTRAVENOUS

## 2022-09-17 MED ORDER — IOHEXOL 9 MG/ML PO SOLN
ORAL | Status: AC
  Administered 2022-09-17: 1000 mL
  Filled 2022-09-17: qty 1000

## 2022-09-17 MED ORDER — IOHEXOL 9 MG/ML PO SOLN
1000.0000 mL | ORAL | Status: AC
  Filled 2022-09-17: qty 1000

## 2022-09-19 NOTE — Patient Instructions (Addendum)
SURGICAL WAITING ROOM VISITATION Patients having surgery or a procedure may have no more than 2 support people in the waiting area - these visitors may rotate.   Children under the age of 5 must have an adult with them who is not the patient. If the patient needs to stay at the hospital during part of their recovery, the visitor guidelines for inpatient rooms apply. Pre-op nurse will coordinate an appropriate time for 1 support person to accompany patient in pre-op.  This support person may not rotate.    Please refer to the [facility] website for the visitor guidelines for Inpatients (after your surgery is over and you are in a regular room).     Your procedure is scheduled on: 10/03/22   Report to [facility] Main Entrance    Report to admitting at 9:30 AM   Call this number if you have problems the morning of surgery [phone]   Do not eat food :After Midnight.   After Midnight you may have the following liquids until 8:30 AM DAY OF SURGERY  Water Non-Citrus Juices (without pulp, NO RED) Carbonated Beverages Black Coffee (NO MILK/CREAM OR CREAMERS, sugar ok)  Clear Tea (NO MILK/CREAM OR CREAMERS, sugar ok) regular and decaf                             Plain Jell-O (NO RED)                                           Fruit ices (not with fruit pulp, NO RED)                                     Popsicles (NO RED)                                                               Sports drinks like Gatorade (NO RED)                     If you have questions, please contact your surgeon's office.   FOLLOW BOWEL PREP AND ANY ADDITIONAL PRE OP INSTRUCTIONS YOU RECEIVED FROM YOUR SURGEON'S OFFICE!!!     Oral Hygiene is also important to reduce your risk of infection.                                    Remember - BRUSH YOUR TEETH THE MORNING OF SURGERY WITH YOUR REGULAR TOOTHPAST   Take these medicines the morning of surgery with A SIP OF WATER: None  DO NOT TAKE ANY ORAL  DIABETIC MEDICATIONS DAY OF YOUR SURGERY  How to Manage Your Diabetes Before and After Surgery  Why is it important to control my blood sugar before and after surgery? Improving blood sugar levels before and after surgery helps healing and can limit problems. A way of improving blood sugar control is eating a healthy diet by:  Eating less sugar and carbohydrates  Increasing activity/exercise  Talking with your doctor about reaching your blood sugar goals High blood sugars (greater than 180 mg/dL) can raise your risk of infections and slow your recovery, so you will need to focus on controlling your diabetes during the weeks before surgery. Make sure that the doctor who takes care of your diabetes knows about your planned surgery including the date and location.  How do I manage my blood sugar before surgery? Check your blood sugar at least 4 times a day, starting 2 days before surgery, to make sure that the level is not too high or low. Check your blood sugar the morning of your surgery when you wake up and every 2 hours until you get to the Short Stay unit. If your blood sugar is less than 70 mg/dL, you will need to treat for low blood sugar: Do not take insulin. Treat a low blood sugar (less than 70 mg/dL) with  cup of clear juice (cranberry or apple), 4 glucose tablets, OR glucose gel. Recheck blood sugar in 15 minutes after treatment (to make sure it is greater than 70 mg/dL). If your blood sugar is not greater than 70 mg/dL on recheck, call [phone] for further instructions. Report your blood sugar to the short stay nurse when you get to Short Stay.  If you are admitted to the hospital after surgery: Your blood sugar will be checked by the staff and you will probably be given insulin after surgery (instead of oral diabetes medicines) to make sure you have good blood sugar levels. The goal for blood sugar control after surgery is 80-180 mg/dL.   WHAT DO I DO ABOUT MY DIABETES  MEDICATION?  Do not take oral diabetes medicines (pills) the morning of surgery.  THE DAY BEFORE SURGERY, take Metformin as prescribed     THE MORNING OF SURGERY, do not take Metformin  Reviewed and Endorsed by [facility] Patient Education Committee, August 2015                              You may not have any metal on your body including hair pins, jewelry, and body piercing             Do not wear make-up, lotions, powders, perfumes, or deodorant  Do not wear nail polish including gel and S&S, artificial/acrylic nails, or any other type of covering on natural nails including finger and toenails. If you have artificial nails, gel coating, etc. that needs to be removed by a nail salon please have this removed prior to surgery or surgery may need to be canceled/ delayed if the surgeon/ anesthesia feels like they are unable to be safely monitored.   Do not shave  48 hours prior to surgery.    Do not bring valuables to the hospital. [place].  DO NOT [place]. PHARMACY WILL DISPENSE MEDICATIONS LISTED ON YOUR MEDICATION LIST TO YOU DURING YOUR ADMISSION [place]!    Patients discharged on the day of surgery will not be allowed to drive home.  Someone NEEDS to stay with you for the first 24 hours after anesthesia.   Special Instructions: Bring a copy of your healthcare power of attorney and living will documents the day of surgery if you haven't scanned them before.  Please read over the following fact sheets you were given: IF [place] [phone] Schneiders    If you received a COVID test during your pre-op visit  it is requested that you wear a mask when out in public, stay away from anyone that may not be feeling well and notify your surgeon if you develop symptoms. If you test positive for Covid or have been in contact with anyone  that has tested positive in the last 10 days please notify you surgeon.  [place] - Preparing for Surgery Before surgery, you can play an important role.  Because skin is not sterile, your skin needs to be as free of germs as possible.  You can reduce the number of germs on your skin by washing with CHG (chlorahexidine gluconate) soap before surgery.  CHG is an antiseptic cleaner which kills germs and bonds with the skin to continue killing germs even after washing. Please DO NOT use if you have an allergy to CHG or antibacterial soaps.  If your skin becomes reddened/irritated stop using the CHG and inform your nurse when you arrive at Short Stay. Do not shave (including legs and underarms) for at least 48 hours prior to the first CHG shower.  You may shave your face/neck.  Please follow these instructions carefully:  1.  Shower with CHG Soap the night before surgery and the  morning of surgery.  2.  If you choose to wash your hair, wash your hair first as usual with your normal  shampoo.  3.  After you shampoo, rinse your hair and body thoroughly to remove the shampoo.                             4.  Use CHG as you would any other liquid soap.  You can apply chg directly to the skin and wash.  Gently with a scrungie or clean washcloth.  5.  Apply the CHG Soap to your body ONLY FROM THE NECK DOWN.   Do   not use on face/ open                           Wound or open sores. Avoid contact with eyes, ears mouth and   genitals (private parts).                       Wash face,  Genitals (private parts) with your normal soap.             6.  Wash thoroughly, paying special attention to the area where your    surgery  will be performed.  7.  Thoroughly rinse your body with warm water from the neck down.  8.  DO NOT shower/wash with your normal soap after using and rinsing off the CHG Soap.                9.  Pat yourself dry with a clean towel.            10.  Wear clean pajamas.            11.  Place  clean sheets on your bed the night of your first shower and do not  sleep with pets. Day of Surgery : Do not apply any lotions/deodorants the morning of surgery.  Please wear clean clothes to the hospital/surgery center.  FAILURE TO FOLLOW THESE INSTRUCTIONS MAY RESULT IN THE CANCELLATION OF YOUR SURGERY  PATIENT SIGNATURE_________________________________  NURSE SIGNATURE__________________________________  ________________________________________________________________________  WHAT IS A BLOOD TRANSFUSION? Blood Transfusion Information  A transfusion is the replacement of blood or some of its parts. Blood is made up of multiple cells which provide different functions. Red blood cells carry oxygen and are used for blood loss replacement. White blood cells fight against infection. Platelets control bleeding. Plasma helps clot blood. Other blood products are available for specialized needs, such as hemophilia or other clotting disorders. BEFORE THE TRANSFUSION  Who gives blood for transfusions?  Healthy volunteers who are fully evaluated to make sure their blood is safe. This is blood bank blood. Transfusion therapy is the safest it has ever been in the practice of medicine. Before blood is taken from a donor, a complete history is taken to make sure that person has no history of diseases nor engages in risky social behavior (examples are intravenous drug use or sexual activity with multiple partners). The donor's travel history is screened to minimize risk of transmitting infections, such as malaria. The donated blood is tested for signs of infectious diseases, such as HIV and hepatitis. The blood is then tested to be sure it is compatible with you in order to minimize the chance of a transfusion reaction. If you or a relative donates blood, this is often done in anticipation of surgery and is not appropriate for emergency situations. It takes many days to process the donated blood. RISKS AND  COMPLICATIONS Although transfusion therapy is very safe and saves many lives, the main dangers of transfusion include:  Getting an infectious disease. Developing a transfusion reaction. This is an allergic reaction to something in the blood you were given. Every precaution is taken to prevent this. The decision to have a blood transfusion has been considered carefully by your caregiver before blood is given. Blood is not given unless the benefits outweigh the risks. AFTER THE TRANSFUSION Right after receiving a blood transfusion, you will usually feel much better and more energetic. This is especially true if your red blood cells have gotten low (anemic). The transfusion raises the level of the red blood cells which carry oxygen, and this usually causes an energy increase. The nurse administering the transfusion will monitor you carefully for complications. HOME CARE INSTRUCTIONS  No special instructions are needed after a transfusion. You may find your energy is better. Speak with your caregiver about any limitations on activity for underlying diseases you may have. SEEK MEDICAL CARE IF:  Your condition is not improving after your transfusion. You develop redness or irritation at the intravenous (IV) site. SEEK IMMEDIATE MEDICAL CARE IF:  Any of the following symptoms occur over the next 12 hours: Shaking chills. You have a temperature by mouth above 102 F (38.9 C), not controlled by medicine. Chest, back, or muscle pain. People around you feel you are not acting correctly or are confused. Shortness of breath or difficulty breathing. Dizziness and fainting. You get a rash or develop hives. You have a decrease in urine output. Your urine turns a dark color or changes to pink, red, or brown. Any of the following symptoms occur over the next 10 days: You have a temperature by mouth above 102 F (38.9 C), not controlled by medicine. Shortness of breath. Weakness after normal activity. The  white part of the eye turns yellow (jaundice). You have a decrease in the amount of urine or are urinating less often. Your  urine turns a dark color or changes to pink, red, or brown. Document Released: 10/12/2000 Document Revised: 01/07/2012 Document Reviewed: 05/31/2008 [facility] [employer] [facility] Patient Information 2014 [place], [place].  _______________________________________________________________________

## 2022-09-19 NOTE — Progress Notes (Addendum)
COVID Vaccine Completed: yes  Date of COVID positive in last 90 days: yes September home test  PCP - Dibas Dorthy Cooler, MD Cardiologist - Minus Breeding, MD LOV 03/25/15  Chest x-ray - n/a EKG - 09/24/22 Epic/chart Stress Test - yes during menopause  ECHO - yes during menopause Cardiac Cath - n/a Pacemaker/ICD device last checked: n/a Spinal Cord Stimulator: n/a  Bowel Prep - light diet day before surgery  Sleep Study - n/a CPAP -   Fasting Blood Sugar - 120-140 Checks Blood Sugar 1 times a day  Last dose of GLP1 agonist-  N/A GLP1 instructions:  N/A   Last dose of SGLT-2 inhibitors-  N/A SGLT-2 instructions: N/A   Blood Thinner Instructions: Aspirin Instructions: ASA 81, hold 1-2 days Last Dose:  Activity level: Can go up a flight of stairs and perform activities of daily living without stopping and without symptoms of chest pain or shortness of breath.    Anesthesia review: heart palpitations 2016  Patient denies shortness of breath, fever, cough and chest pain at PAT appointment  Patient verbalized understanding of instructions that were given to them at the PAT appointment. Patient was also instructed that they will need to review over the PAT instructions again at home before surgery.

## 2022-09-24 ENCOUNTER — Encounter (HOSPITAL_COMMUNITY)
Admission: RE | Admit: 2022-09-24 | Discharge: 2022-09-24 | Disposition: A | Discharge status: Home / Self Care | Payer: Medicare PPO | Source: Ambulatory Visit | Attending: Gynecologic Oncology | Admitting: Gynecologic Oncology

## 2022-09-24 ENCOUNTER — Encounter (HOSPITAL_COMMUNITY): Payer: Self-pay

## 2022-09-24 VITALS — BP 138/94 | HR 64 | Temp 98.3°F | Resp 14 | Ht 64.5 in | Wt 227.0 lb

## 2022-09-24 DIAGNOSIS — E1169 Type 2 diabetes mellitus with other specified complication: Secondary | ICD-10-CM | POA: Insufficient documentation

## 2022-09-24 DIAGNOSIS — E669 Obesity, unspecified: Secondary | ICD-10-CM | POA: Insufficient documentation

## 2022-09-24 DIAGNOSIS — Z01818 Encounter for other preprocedural examination: Secondary | ICD-10-CM | POA: Insufficient documentation

## 2022-09-24 DIAGNOSIS — C541 Malignant neoplasm of endometrium: Secondary | ICD-10-CM | POA: Insufficient documentation

## 2022-09-24 LAB — CBC
HCT: 46.7 % — ABNORMAL HIGH (ref 36.0–46.0)
Hemoglobin: 15.8 g/dL — ABNORMAL HIGH (ref 12.0–15.0)
MCH: 31.3 pg (ref 26.0–34.0)
MCHC: 33.8 g/dL (ref 30.0–36.0)
MCV: 92.7 fL (ref 80.0–100.0)
Platelets: 174 10*3/uL (ref 150–400)
RBC: 5.04 MIL/uL (ref 3.87–5.11)
RDW: 12.9 % (ref 11.5–15.5)
WBC: 5.1 10*3/uL (ref 4.0–10.5)
nRBC: 0 % (ref 0.0–0.2)

## 2022-09-24 LAB — COMPREHENSIVE METABOLIC PANEL
ALT: 26 U/L (ref 0–44)
AST: 26 U/L (ref 15–41)
Albumin: 3.7 g/dL (ref 3.5–5.0)
Alkaline Phosphatase: 55 U/L (ref 38–126)
Anion gap: 9 (ref 5–15)
BUN: 16 mg/dL (ref 8–23)
CO2: 24 mmol/L (ref 22–32)
Calcium: 9.3 mg/dL (ref 8.9–10.3)
Chloride: 107 mmol/L (ref 98–111)
Creatinine, Ser: 0.68 mg/dL (ref 0.44–1.00)
GFR, Estimated: >60 mL/min (ref >60)
Glucose, Bld: 128 mg/dL — ABNORMAL HIGH (ref 70–99)
Potassium: 3.7 mmol/L (ref 3.5–5.1)
Sodium: 140 mmol/L (ref 135–145)
Total Bilirubin: 1 mg/dL (ref 0.3–1.2)
Total Protein: 6.5 g/dL (ref 6.5–8.1)

## 2022-09-24 LAB — GLUCOSE, CAPILLARY: Glucose-Capillary: 132 mg/dL — ABNORMAL HIGH (ref 70–99)

## 2022-09-26 ENCOUNTER — Telehealth: Payer: Self-pay

## 2022-09-26 LAB — HEMOGLOBIN A1C
Hgb A1c MFr Bld: 6.2 % — ABNORMAL HIGH (ref 4.8–5.6)
Mean Plasma Glucose: 131 mg/dL

## 2022-09-26 NOTE — Telephone Encounter (Signed)
Pt aware of recent HgbA1C being 6.2. She will monitor and states that is about what it runs between 5.9 - 6.2.  She states she has an appointment with her PCP in January

## 2022-10-02 ENCOUNTER — Telehealth: Payer: Self-pay | Admitting: Surgery

## 2022-10-02 NOTE — Telephone Encounter (Signed)
Telephone call to check on pre-operative status.  Patient compliant with pre-operative instructions.  Reinforced nothing to eat after midnight. Clear liquids until 8:15am. Patient to arrive at 9:15am.  No questions or concerns voiced.  Instructed to call for any needs.

## 2022-10-03 ENCOUNTER — Ambulatory Visit (HOSPITAL_COMMUNITY): Payer: Medicare PPO | Admitting: Physician Assistant

## 2022-10-03 ENCOUNTER — Ambulatory Visit (HOSPITAL_COMMUNITY)
Admission: RE | Admit: 2022-10-03 | Discharge: 2022-10-03 | Disposition: A | Discharge status: Home / Self Care | Payer: Medicare PPO | Source: Ambulatory Visit | Attending: Gynecologic Oncology | Admitting: Gynecologic Oncology

## 2022-10-03 ENCOUNTER — Encounter (HOSPITAL_COMMUNITY)
Admission: RE | Disposition: A | Discharge status: Home / Self Care | Payer: Self-pay | Source: Ambulatory Visit | Attending: Gynecologic Oncology

## 2022-10-03 ENCOUNTER — Other Ambulatory Visit: Payer: Self-pay

## 2022-10-03 ENCOUNTER — Encounter (HOSPITAL_COMMUNITY): Payer: Self-pay | Admitting: Gynecologic Oncology

## 2022-10-03 ENCOUNTER — Ambulatory Visit (HOSPITAL_BASED_OUTPATIENT_CLINIC_OR_DEPARTMENT_OTHER): Payer: Medicare PPO | Admitting: Physician Assistant

## 2022-10-03 DIAGNOSIS — N390 Urinary tract infection, site not specified: Secondary | ICD-10-CM | POA: Diagnosis not present

## 2022-10-03 DIAGNOSIS — I10 Essential (primary) hypertension: Secondary | ICD-10-CM | POA: Insufficient documentation

## 2022-10-03 DIAGNOSIS — M1990 Unspecified osteoarthritis, unspecified site: Secondary | ICD-10-CM | POA: Insufficient documentation

## 2022-10-03 DIAGNOSIS — N838 Other noninflammatory disorders of ovary, fallopian tube and broad ligament: Secondary | ICD-10-CM | POA: Diagnosis not present

## 2022-10-03 DIAGNOSIS — E1169 Type 2 diabetes mellitus with other specified complication: Secondary | ICD-10-CM

## 2022-10-03 DIAGNOSIS — C541 Malignant neoplasm of endometrium: Secondary | ICD-10-CM

## 2022-10-03 DIAGNOSIS — C548 Malignant neoplasm of overlapping sites of corpus uteri: Secondary | ICD-10-CM | POA: Diagnosis not present

## 2022-10-03 DIAGNOSIS — Z7984 Long term (current) use of oral hypoglycemic drugs: Secondary | ICD-10-CM | POA: Diagnosis not present

## 2022-10-03 DIAGNOSIS — Z6838 Body mass index (BMI) 38.0-38.9, adult: Secondary | ICD-10-CM | POA: Insufficient documentation

## 2022-10-03 DIAGNOSIS — E119 Type 2 diabetes mellitus without complications: Secondary | ICD-10-CM | POA: Diagnosis not present

## 2022-10-03 DIAGNOSIS — T83511A Infection and inflammatory reaction due to indwelling urethral catheter, initial encounter: Secondary | ICD-10-CM | POA: Diagnosis not present

## 2022-10-03 DIAGNOSIS — N888 Other specified noninflammatory disorders of cervix uteri: Secondary | ICD-10-CM | POA: Diagnosis not present

## 2022-10-03 HISTORY — PX: ROBOTIC ASSISTED TOTAL HYSTERECTOMY WITH BILATERAL SALPINGO OOPHERECTOMY: SHX6086

## 2022-10-03 HISTORY — PX: SENTINEL NODE BIOPSY: SHX6608

## 2022-10-03 LAB — GLUCOSE, CAPILLARY
Glucose-Capillary: 146 mg/dL — ABNORMAL HIGH (ref 70–99)
Glucose-Capillary: 175 mg/dL — ABNORMAL HIGH (ref 70–99)

## 2022-10-03 LAB — TYPE AND SCREEN
ABO/RH(D): A POS
Antibody Screen: NEGATIVE

## 2022-10-03 SURGERY — HYSTERECTOMY, TOTAL, ROBOT-ASSISTED, LAPAROSCOPIC, WITH BILATERAL SALPINGO-OOPHORECTOMY
Anesthesia: General

## 2022-10-03 MED ORDER — DEXAMETHASONE SODIUM PHOSPHATE 10 MG/ML IJ SOLN
INTRAMUSCULAR | Status: DC | PRN
  Administered 2022-10-03: 10 mg via INTRAVENOUS

## 2022-10-03 MED ORDER — LIDOCAINE HCL (PF) 2 % IJ SOLN
INTRAMUSCULAR | Status: DC | PRN
  Administered 2022-10-03: 1 mg/kg/h via INTRADERMAL

## 2022-10-03 MED ORDER — CHLORHEXIDINE GLUCONATE 0.12 % MT SOLN
15.0000 mL | Freq: Once | OROMUCOSAL | Status: AC
  Administered 2022-10-03: 15 mL via OROMUCOSAL

## 2022-10-03 MED ORDER — STERILE WATER FOR INJECTION IJ SOLN
INTRAMUSCULAR | Status: DC | PRN
  Administered 2022-10-03: 4 mL

## 2022-10-03 MED ORDER — LACTATED RINGERS IR SOLN
Status: DC | PRN
  Administered 2022-10-03: 1000 mL

## 2022-10-03 MED ORDER — ONDANSETRON HCL 4 MG/2ML IJ SOLN
INTRAMUSCULAR | Status: DC | PRN
  Administered 2022-10-03: 4 mg via INTRAVENOUS

## 2022-10-03 MED ORDER — LIDOCAINE 2% (20 MG/ML) 5 ML SYRINGE
INTRAMUSCULAR | Status: DC | PRN
  Administered 2022-10-03: 100 mg via INTRAVENOUS

## 2022-10-03 MED ORDER — DEXAMETHASONE SODIUM PHOSPHATE 10 MG/ML IJ SOLN
INTRAMUSCULAR | Status: AC
  Filled 2022-10-03: qty 1

## 2022-10-03 MED ORDER — BUPIVACAINE HCL 0.25 % IJ SOLN
INTRAMUSCULAR | Status: AC
  Filled 2022-10-03: qty 1

## 2022-10-03 MED ORDER — FENTANYL CITRATE PF 50 MCG/ML IJ SOSY: 25.0000 ug | PREFILLED_SYRINGE | INTRAMUSCULAR | Status: DC | PRN

## 2022-10-03 MED ORDER — CIPROFLOXACIN IN D5W 400 MG/200ML IV SOLN
400.0000 mg | INTRAVENOUS | Status: AC
  Administered 2022-10-03: 400 mg via INTRAVENOUS
  Filled 2022-10-03: qty 200

## 2022-10-03 MED ORDER — ONDANSETRON HCL 4 MG/2ML IJ SOLN
INTRAMUSCULAR | Status: AC
  Filled 2022-10-03: qty 2

## 2022-10-03 MED ORDER — SUGAMMADEX SODIUM 200 MG/2ML IV SOLN
INTRAVENOUS | Status: DC | PRN
  Administered 2022-10-03: 200 mg via INTRAVENOUS

## 2022-10-03 MED ORDER — AMISULPRIDE (ANTIEMETIC) 5 MG/2ML IV SOLN: 10.0000 mg | Freq: Once | INTRAVENOUS | Status: DC | PRN

## 2022-10-03 MED ORDER — OXYCODONE HCL 5 MG PO TABS
ORAL_TABLET | ORAL | Status: AC
  Filled 2022-10-03: qty 1

## 2022-10-03 MED ORDER — BUPIVACAINE HCL 0.25 % IJ SOLN
INTRAMUSCULAR | Status: DC | PRN
  Administered 2022-10-03: 32 mL

## 2022-10-03 MED ORDER — LIDOCAINE HCL 1 % IJ SOLN
INTRAMUSCULAR | Status: AC
  Filled 2022-10-03: qty 40

## 2022-10-03 MED ORDER — FENTANYL CITRATE (PF) 100 MCG/2ML IJ SOLN
INTRAMUSCULAR | Status: AC
  Filled 2022-10-03: qty 2

## 2022-10-03 MED ORDER — PROPOFOL 10 MG/ML IV BOLUS
INTRAVENOUS | Status: DC | PRN
  Administered 2022-10-03: 130 mg via INTRAVENOUS

## 2022-10-03 MED ORDER — STERILE WATER FOR INJECTION IJ SOLN
INTRAMUSCULAR | Status: AC
  Filled 2022-10-03: qty 10

## 2022-10-03 MED ORDER — ORAL CARE MOUTH RINSE: 15.0000 mL | Freq: Once | OROMUCOSAL | Status: AC

## 2022-10-03 MED ORDER — FENTANYL CITRATE (PF) 100 MCG/2ML IJ SOLN
INTRAMUSCULAR | Status: DC | PRN
  Administered 2022-10-03: 100 ug via INTRAVENOUS

## 2022-10-03 MED ORDER — ROCURONIUM BROMIDE 10 MG/ML (PF) SYRINGE
PREFILLED_SYRINGE | INTRAVENOUS | Status: AC
  Filled 2022-10-03: qty 10

## 2022-10-03 MED ORDER — DEXAMETHASONE SODIUM PHOSPHATE 4 MG/ML IJ SOLN: 4.0000 mg | INTRAMUSCULAR | Status: DC

## 2022-10-03 MED ORDER — MIDAZOLAM HCL 2 MG/2ML IJ SOLN
INTRAMUSCULAR | Status: AC
  Filled 2022-10-03: qty 2

## 2022-10-03 MED ORDER — STERILE WATER FOR INJECTION IJ SOLN
INTRAMUSCULAR | Status: DC | PRN
  Administered 2022-10-03: 10 mL

## 2022-10-03 MED ORDER — VANCOMYCIN HCL 1500 MG/300ML IV SOLN
1500.0000 mg | Freq: Once | INTRAVENOUS | Status: AC
  Administered 2022-10-03 (×2): 1500 mg via INTRAVENOUS
  Filled 2022-10-03: qty 300

## 2022-10-03 MED ORDER — PROPOFOL 10 MG/ML IV BOLUS
INTRAVENOUS | Status: AC
  Filled 2022-10-03: qty 20

## 2022-10-03 MED ORDER — STERILE WATER FOR IRRIGATION IR SOLN
Status: DC | PRN
  Administered 2022-10-03: 1000 mL

## 2022-10-03 MED ORDER — MIDAZOLAM HCL 5 MG/5ML IJ SOLN
INTRAMUSCULAR | Status: DC | PRN
  Administered 2022-10-03: 2 mg via INTRAVENOUS

## 2022-10-03 MED ORDER — OXYCODONE HCL 5 MG PO TABS
5.0000 mg | ORAL_TABLET | Freq: Once | ORAL | Status: AC | PRN
  Administered 2022-10-03: 5 mg via ORAL

## 2022-10-03 MED ORDER — ONDANSETRON HCL 4 MG/2ML IJ SOLN: 4.0000 mg | Freq: Once | INTRAMUSCULAR | Status: DC | PRN

## 2022-10-03 MED ORDER — ROCURONIUM BROMIDE 10 MG/ML (PF) SYRINGE
PREFILLED_SYRINGE | INTRAVENOUS | Status: DC | PRN
  Administered 2022-10-03: 70 mg via INTRAVENOUS
  Administered 2022-10-03: 10 mg via INTRAVENOUS

## 2022-10-03 MED ORDER — OXYCODONE HCL 5 MG/5ML PO SOLN: 5.0000 mg | Freq: Once | ORAL | Status: AC | PRN

## 2022-10-03 MED ORDER — HEPARIN SODIUM (PORCINE) 5000 UNIT/ML IJ SOLN
5000.0000 [IU] | INTRAMUSCULAR | Status: AC
  Administered 2022-10-03: 5000 [IU] via SUBCUTANEOUS
  Filled 2022-10-03: qty 1

## 2022-10-03 MED ORDER — ASPIRIN 81 MG PO TBEC: 81.0000 mg | DELAYED_RELEASE_TABLET | Freq: Every day | ORAL | 12 refills | Status: DC

## 2022-10-03 MED ORDER — LACTATED RINGERS IV SOLN: INTRAVENOUS | Status: DC

## 2022-10-03 SURGICAL SUPPLY — 80 items
ADH SKN CLS APL DERMABOND .7 (GAUZE/BANDAGES/DRESSINGS) ×1
AGENT HMST KT MTR STRL THRMB (HEMOSTASIS)
APL ESCP 34 STRL LF DISP (HEMOSTASIS)
APPLICATOR SURGIFLO ENDO (HEMOSTASIS) IMPLANT
BAG LAPAROSCOPIC 12 15 PORT 16 (BASKET) IMPLANT
BAG RETRIEVAL 12/15 (BASKET) ×1
BLADE SURG SZ10 CARB STEEL (BLADE) IMPLANT
COVER BACK TABLE 60X90IN (DRAPES) ×1 IMPLANT
COVER TIP SHEARS 8 DVNC (MISCELLANEOUS) ×1 IMPLANT
COVER TIP SHEARS 8MM DA VINCI (MISCELLANEOUS) ×1
DERMABOND ADVANCED .7 DNX12 (GAUZE/BANDAGES/DRESSINGS) ×1 IMPLANT
DRAPE ARM DVNC X/XI (DISPOSABLE) ×4 IMPLANT
DRAPE COLUMN DVNC XI (DISPOSABLE) ×1 IMPLANT
DRAPE DA VINCI XI ARM (DISPOSABLE) ×4
DRAPE DA VINCI XI COLUMN (DISPOSABLE) ×1
DRAPE SHEET LG 3/4 BI-LAMINATE (DRAPES) ×1 IMPLANT
DRAPE SURG IRRIG POUCH 19X23 (DRAPES) ×1 IMPLANT
DRSG OPSITE POSTOP 4X6 (GAUZE/BANDAGES/DRESSINGS) IMPLANT
DRSG OPSITE POSTOP 4X8 (GAUZE/BANDAGES/DRESSINGS) IMPLANT
ELECT PENCIL ROCKER SW 15FT (MISCELLANEOUS) IMPLANT
ELECT REM PT RETURN 15FT ADLT (MISCELLANEOUS) ×1 IMPLANT
GAUZE 4X4 16PLY ~~LOC~~+RFID DBL (SPONGE) ×2 IMPLANT
GLOVE BIO SURGEON STRL SZ 6 (GLOVE) ×4 IMPLANT
GLOVE BIO SURGEON STRL SZ 6.5 (GLOVE) ×1 IMPLANT
GOWN STRL REUS W/ TWL LRG LVL3 (GOWN DISPOSABLE) ×4 IMPLANT
GOWN STRL REUS W/TWL LRG LVL3 (GOWN DISPOSABLE) ×6
GRASPER SUT TROCAR 14GX15 (MISCELLANEOUS) IMPLANT
HOLDER FOLEY CATH W/STRAP (MISCELLANEOUS) IMPLANT
IRRIG SUCT STRYKERFLOW 2 WTIP (MISCELLANEOUS) ×1
IRRIGATION SUCT STRKRFLW 2 WTP (MISCELLANEOUS) ×1 IMPLANT
KIT PROCEDURE DA VINCI SI (MISCELLANEOUS) ×1
KIT PROCEDURE DVNC SI (MISCELLANEOUS) IMPLANT
KIT TURNOVER KIT A (KITS) IMPLANT
LIGASURE IMPACT 36 18CM CVD LR (INSTRUMENTS) IMPLANT
MANIPULATOR ADVINCU DEL 3.0 PL (MISCELLANEOUS) IMPLANT
MANIPULATOR ADVINCU DEL 3.5 PL (MISCELLANEOUS) IMPLANT
MANIPULATOR UTERINE 4.5 ZUMI (MISCELLANEOUS) IMPLANT
NDL HYPO 21X1.5 SAFETY (NEEDLE) ×1 IMPLANT
NDL SPNL 18GX3.5 QUINCKE PK (NEEDLE) IMPLANT
NEEDLE HYPO 21X1.5 SAFETY (NEEDLE) ×1 IMPLANT
NEEDLE SPNL 18GX3.5 QUINCKE PK (NEEDLE) ×1 IMPLANT
OBTURATOR OPTICAL STANDARD 8MM (TROCAR) ×1
OBTURATOR OPTICAL STND 8 DVNC (TROCAR) ×1
OBTURATOR OPTICALSTD 8 DVNC (TROCAR) ×1 IMPLANT
PACK ROBOT GYN CUSTOM WL (TRAY / TRAY PROCEDURE) ×1 IMPLANT
PAD POSITIONING PINK XL (MISCELLANEOUS) ×1 IMPLANT
PORT ACCESS TROCAR AIRSEAL 12 (TROCAR) IMPLANT
SEAL CANN UNIV 5-8 DVNC XI (MISCELLANEOUS) ×3 IMPLANT
SEAL XI 5MM-8MM UNIVERSAL (MISCELLANEOUS) ×4
SET TRI-LUMEN FLTR TB AIRSEAL (TUBING) ×1 IMPLANT
SOL PREP POV-IOD 4OZ 10% (MISCELLANEOUS) ×2 IMPLANT
SPIKE FLUID TRANSFER (MISCELLANEOUS) ×1 IMPLANT
SPONGE T-LAP 18X18 ~~LOC~~+RFID (SPONGE) IMPLANT
SURGIFLO W/THROMBIN 8M KIT (HEMOSTASIS) IMPLANT
SUT MNCRL AB 4-0 PS2 18 (SUTURE) IMPLANT
SUT PDS AB 1 TP1 96 (SUTURE) IMPLANT
SUT V-LOC 180 0-0 GS22 (SUTURE) IMPLANT
SUT VIC AB 0 CT1 27 (SUTURE)
SUT VIC AB 0 CT1 27XBRD ANTBC (SUTURE) IMPLANT
SUT VIC AB 2-0 CT1 27 (SUTURE)
SUT VIC AB 2-0 CT1 TAPERPNT 27 (SUTURE) IMPLANT
SUT VIC AB 2-0 SH 27 (SUTURE) ×1
SUT VIC AB 2-0 SH 27X BRD (SUTURE) IMPLANT
SUT VIC AB 4-0 PS2 18 (SUTURE) ×2 IMPLANT
SUT VLOC 180 0 9IN  GS21 (SUTURE) ×1
SUT VLOC 180 0 9IN GS21 (SUTURE) IMPLANT
SYR 10ML LL (SYRINGE) IMPLANT
SYS BAG RETRIEVAL 10MM (BASKET)
SYS RETRIEVAL 5MM INZII UNIV (BASKET) ×1
SYS WOUND ALEXIS 18CM MED (MISCELLANEOUS)
SYSTEM BAG RETRIEVAL 10MM (BASKET) IMPLANT
SYSTEM RETRIEVL 5MM INZII UNIV (BASKET) IMPLANT
SYSTEM WOUND ALEXIS 18CM MED (MISCELLANEOUS) IMPLANT
TOWEL OR NON WOVEN STRL DISP B (DISPOSABLE) IMPLANT
TRAP SPECIMEN MUCUS 40CC (MISCELLANEOUS) IMPLANT
TRAY FOLEY MTR SLVR 16FR STAT (SET/KITS/TRAYS/PACK) ×1 IMPLANT
TROCAR PORT AIRSEAL 5X120 (TROCAR) IMPLANT
UNDERPAD 30X36 HEAVY ABSORB (UNDERPADS AND DIAPERS) ×2 IMPLANT
WATER STERILE IRR 1000ML POUR (IV SOLUTION) ×1 IMPLANT
YANKAUER SUCT BULB TIP 10FT TU (MISCELLANEOUS) IMPLANT

## 2022-10-03 NOTE — Anesthesia Postprocedure Evaluation (Signed)
Anesthesia Post Note  Patient: Morgan Frye  Procedure(s) Performed: XI ROBOTIC ASSISTED TOTAL HYSTERECTOMY WITH BILATERAL SALPINGO OOPHORECTOMY SENTINEL NODE BIOPSY     Patient location during evaluation: PACU Anesthesia Type: General Level of consciousness: awake and alert Pain management: pain level controlled Vital Signs Assessment: post-procedure vital signs reviewed and stable Respiratory status: spontaneous breathing, nonlabored ventilation and respiratory function stable Cardiovascular status: blood pressure returned to baseline and stable Postop Assessment: no apparent nausea or vomiting Anesthetic complications: no   No notable events documented.  Last Vitals:  Vitals:   10/03/22 1630 10/03/22 1700  BP:  (!) 163/90  Pulse: 72 75  Resp: 18 18  Temp:    SpO2:  94%    Last Pain:  Vitals:   10/03/22 1700  TempSrc:   PainSc: 0-No pain                 Lidia Collum

## 2022-10-03 NOTE — Anesthesia Procedure Notes (Signed)
Procedure Name: Intubation Date/Time: 10/03/2022 11:30 AM  Performed by: Gerald Leitz, CRNAPre-anesthesia Checklist: Patient identified, Patient being monitored, Timeout performed, Emergency Drugs available and Suction available Patient Re-evaluated:Patient Re-evaluated prior to induction Oxygen Delivery Method: Circle system utilized Preoxygenation: Pre-oxygenation with 100% oxygen Induction Type: IV induction Ventilation: Mask ventilation without difficulty Laryngoscope Size: Mac and 3 Grade View: Grade I Tube type: Oral Tube size: 7.5 mm Number of attempts: 1 Airway Equipment and Method: Stylet Placement Confirmation: ETT inserted through vocal cords under direct vision, positive ETCO2 and breath sounds checked- equal and bilateral Secured at: 21 cm Tube secured with: Tape Dental Injury: Teeth and Oropharynx as per pre-operative assessment

## 2022-10-03 NOTE — Anesthesia Preprocedure Evaluation (Addendum)
Anesthesia Evaluation  Patient identified by MRN, date of birth, ID band Patient awake    Reviewed: Allergy & Precautions, NPO status , Patient's Chart, lab work & pertinent test results, reviewed documented beta blocker date and time   History of Anesthesia Complications Negative for: history of anesthetic complications  Airway Mallampati: I  TM Distance: >3 FB Neck ROM: Full    Dental  (+) Teeth Intact, Dental Advisory Given   Pulmonary neg pulmonary ROS   Pulmonary exam normal        Cardiovascular hypertension, Pt. on home beta blockers and Pt. on medications Normal cardiovascular exam     Neuro/Psych negative neurological ROS     GI/Hepatic negative GI ROS, Neg liver ROS,,,  Endo/Other  diabetes, Type 2, Oral Hypoglycemic Agents    Renal/GU negative Renal ROS  negative genitourinary   Musculoskeletal  (+) Arthritis ,    Abdominal   Peds  Hematology negative hematology ROS (+)   Anesthesia Other Findings   Reproductive/Obstetrics Endometrial cancer                             Anesthesia Physical Anesthesia Plan  ASA: 2  Anesthesia Plan: General   Post-op Pain Management: Toradol IV (intra-op)*   Induction: Intravenous  PONV Risk Score and Plan: 3 and Ondansetron, Dexamethasone, Treatment may vary due to age or medical condition and Midazolam  Airway Management Planned: Oral ETT  Additional Equipment: None  Intra-op Plan:   Post-operative Plan: Extubation in OR  Informed Consent: I have reviewed the patients History and Physical, chart, labs and discussed the procedure including the risks, benefits and alternatives for the proposed anesthesia with the patient or authorized representative who has indicated his/her understanding and acceptance.     Dental advisory given  Plan Discussed with:   Anesthesia Plan Comments:        Anesthesia Quick Evaluation

## 2022-10-03 NOTE — Op Note (Signed)
OPERATIVE NOTE  Pre-operative Diagnosis: endometrial cancer grade 2  Post-operative Diagnosis: same  Operation: Robotic-assisted laparoscopic total hysterectomy with bilateral salpingoophorectomy, SLN biopsy bilaterally Extreme morbid obesity requiring additional OR personnel for positioning and retraction. Obesity made retroperitoneal visualization limited and increased the complexity of the case and necessitated additional instrumentation for retraction. Obesity related complexity increased the duration of the procedure by 60 minutes.    Surgeon: Jeral Pinch MD  Assistant Surgeon: [name] NP  Anesthesia: GET  Urine Output: 650 cc  Operative Findings:  On EUA, 8 cm mobile uterus. Small cervix flush with the vaginal apex. On intra-abdominal entry, normal upper abdominal survey. Normal small and large bowel. Multiple fecaliths within the pelvis (removed). Uterus 8cm and normal in appearance although posterior serosa tore very easily with any sort of manipulation concerning for possible deeper myometrial invasion. Bilateral ovaries and tubes normal in appearance. SLN mapping successful to two pelvic lymph nodes bilaterally. Some fibrosis of the retroperitoneum, more on the left than right. Dense adhesions between the bladder and LUS/cervix.   Estimated Blood Loss:  100 cc      Total IV Fluids: see I&O flowsheet         Specimens: uterus, cervix, bilateral tubes and ovaries, right obturator SLN, right external iliac SLN, left obturator sentinel lymph node, left obturator SLN #2 and adjacent non-green lymph node         Complications:  None apparent; patient tolerated the procedure well.         Disposition: PACU - hemodynamically stable.  Procedure Details  The patient was seen in the Holding Room. The risks, benefits, complications, treatment options, and expected outcomes were discussed with the patient.  The patient concurred with the proposed plan, giving informed consent.   The site of surgery properly noted/marked. The patient was identified as Morgan Frye and the procedure verified as a Robotic-assisted hysterectomy with bilateral salpingo oophorectomy with SLN biopsy.   After induction of anesthesia, the patient was draped and prepped in the usual sterile manner. Patient was placed in supine position after anesthesia and draped and prepped in the usual sterile manner as follows: Her arms were tucked to her side with all appropriate precautions.  The patient was secured to the bed using padding and tape across her chest.  The patient was placed in the semi-lithotomy position in [place].  The perineum and vagina were prepped with Betadine. The patient's abdomen was prepped with ChloraPrep and she was draped after the prep had been allowed to dry for 3 minutes.  A Time Out was held and the above information confirmed.  The urethra was prepped with Betadine. Foley catheter was placed.  A sterile speculum was placed in the vagina.  The cervix was grasped with a single-tooth tenaculum. '2mg'$  total of ICG was injected into the cervical stroma at 2 and 9 o'clock with 1cc injected at a 1cm and 42m depth (concentration 0.'5mg'$ /ml) in all locations. The cervix was dilated with PKennon Roundsdilators.  The ZUMI uterine manipulator with a medium colpotomizer ring was placed without difficulty.  A pneum occluder balloon was placed over the manipulator.  OG tube placement was confirmed and to suction.   Next, a 10 mm skin incision was made 1 cm below the subcostal margin in the midclavicular line.  The 5 mm Optiview port and scope was used for direct entry.  Opening pressure was under 10 mm CO2.  The abdomen was insufflated and the findings were noted as above.  At this point and all points during the procedure, the patient's intra-abdominal pressure did not exceed 15 mmHg. Next, an 8 mm skin incision was made superior to the umbilicus and a right and left port were placed about 8 cm lateral  to the robot port on the right and left side.  A fourth arm was placed on the right.  The 5 mm assist trocar was exchanged for a 10-12 mm port. All ports were placed under direct visualization.  The patient was placed in steep Trendelenburg.  Bowel was folded away into the upper abdomen.  The robot was docked in the normal manner.  The right and left peritoneum were opened parallel to the IP ligament to open the retroperitoneal spaces bilaterally. The round ligaments were transected. The SLN mapping was performed in bilateral pelvic basins. After identifying the ureters, the para rectal and paravesical spaces were opened up entirely with careful dissection below the level of the ureters bilaterally and to the depth of the uterine artery origin in order to skeletonize the uterine "web" and ensure visualization of all parametrial channels. The para-aortic basins were carefully exposed and evaluated for isolated para-aortic SLN's. Lymphatic channels were identified travelling to the following visualized sentinel lymph node's: right obturator, right external iliac, left obturator, left obturator and adjacent non-green lymph node. These SLN's were separated from their surrounding lymphatic tissue, removed and sent for permanent pathology.  The hysterectomy was started.  The ureter was again noted to be on the medial leaf of the broad ligament.  The peritoneum above the ureter was incised and stretched and the infundibulopelvic ligament was skeletonized, cauterized and cut.  The posterior peritoneum was taken down to the level of the KOH ring.  The anterior peritoneum was also taken down.  The bladder flap was created to the level of the KOH ring with significant adhesions noted between the bladder and cervix.  The uterine artery on the right side was skeletonized, cauterized and cut in the normal manner.  A similar procedure was performed on the left.  The colpotomy was made and the uterus, cervix, bilateral ovaries  and tubes were amputated and delivered through the vagina.  Pedicles were inspected and excellent hemostasis was achieved.    The colpotomy at the vaginal cuff was closed with 0 Vicryl using a figure of eight stitch at either apex and 0 V-Loc to close the midline in running manner.  Some bleeding was noted at the superior aspect of the left apex. A figure of eight with 2-0 Vicryl was used to achieve hemostasis. Irrigation was used and excellent hemostasis was achieved.  Intra-abdominal pressure was decreased to 5 mm Hg with continued hemostasis noted.  At this point in the procedure was completed.  Robotic instruments were removed under direct visulaization.  The robot was undocked. The fascia at the 10-12 mm port was closed with 0 Vicryl using a PMI fascial closure device under direct visualization.  The subcuticular tissue was closed with 4-0 Vicryl and the skin was closed with 4-0 Monocryl in a subcuticular manner.  Dermabond was applied.    The vagina was swabbed with minimal bleeding noted. Vagina was irrigated with sterile water. Foley catheter was removed  All sponge, lap and needle counts were correct x  3.   The patient was transferred to the recovery room in stable condition.  Jeral Pinch, MD

## 2022-10-03 NOTE — Discharge Instructions (Addendum)
AFTER SURGERY INSTRUCTIONS   Return to work: 4-6 weeks if applicable  Do not restart your aspirin for 3 days after surgery.   You had a small skin tear from the adhesive on the surgical drapes in the crease of your right thigh. You can apply vaseline or neosporin to this area while it heals.   Activity: 1. Be up and out of the bed during the day.  Take a nap if needed.  You may walk up steps but be careful and use the hand rail.  Stair climbing will tire you more than you think, you may need to stop part way and rest.    2. No lifting or straining for 6 weeks over 10 pounds. No pushing, pulling, straining for 6 weeks.   3. No driving for around 1 week(s).  Do not drive if you are taking narcotic pain medicine and make sure that your reaction time has returned.    4. You can shower as soon as the next day after surgery. Shower daily.  Use your regular soap and water (not directly on the incision) and pat your incision(s) dry afterwards; don't rub.  No tub baths or submerging your body in water until cleared by your surgeon. If you have the soap that was given to you by pre-surgical testing that was used before surgery, you do not need to use it afterwards because this can irritate your incisions.    5. No sexual activity and nothing in the vagina for 8-10 weeks.   6. You may experience a small amount of clear drainage from your incisions, which is normal.  If the drainage persists, increases, or changes color please call the office.   7. Do not use creams, lotions, or ointments such as neosporin on your incisions after surgery until advised by your surgeon because they can cause removal of the dermabond glue on your incisions.     8. You may experience vaginal spotting after surgery or around the 6-8 week mark from surgery when the stitches at the top of the vagina begin to dissolve.  The spotting is normal but if you experience heavy bleeding, call our office.   9. Take Tylenol or ibuprofen  first for pain if you are able to take these medications and only use narcotic pain medication for severe pain not relieved by the Tylenol or Ibuprofen.  Monitor your Tylenol intake to a max of 4,000 mg in a 24 hour period. You can alternate these medications after surgery.   Diet: 1. Low sodium Heart Healthy Diet is recommended but you are cleared to resume your normal (before surgery) diet after your procedure.   2. It is safe to use a laxative, such as Miralax or Colace, if you have difficulty moving your bowels. You have been prescribed Sennakot-S to take at bedtime every evening after surgery to keep bowel movements regular and to prevent constipation.     Wound Care: 1. Keep clean and dry.  Shower daily.   Reasons to call the Doctor: Fever - Oral temperature greater than 100.4 degrees Fahrenheit Foul-smelling vaginal discharge Difficulty urinating Nausea and vomiting Increased pain at the site of the incision that is unrelieved with pain medicine. Difficulty breathing with or without chest pain New calf pain especially if only on one side Sudden, continuing increased vaginal bleeding with or without clots.   Contacts: For questions or concerns you should contact:   Dr. Jeral Pinch at [phone]   [name], NP at [phone]  After Hours: call [phone] and have the GYN Oncologist paged/contacted (after 5 pm or on the weekends).   Messages sent via mychart are for non-urgent matters and are not responded to after hours so for urgent needs, please call the after hours number.

## 2022-10-03 NOTE — Transfer of Care (Signed)
Immediate Anesthesia Transfer of Care Note  Patient: Morgan Frye  Procedure(s) Performed: Procedure(s): XI ROBOTIC ASSISTED TOTAL HYSTERECTOMY WITH BILATERAL SALPINGO OOPHORECTOMY (N/A) SENTINEL NODE BIOPSY (N/A)  Patient Location: PACU  Anesthesia Type:General  Level of Consciousness: Alert, Awake, Oriented  Airway & Oxygen Therapy: Patient Spontanous Breathing  Post-op Assessment: Report given to RN  Post vital signs: Reviewed and stable  Last Vitals:  Vitals:   10/03/22 0923 10/03/22 1437  BP: (!) 142/78 (!) 144/83  Pulse: 73 76  Resp: 15 18  Temp: 37 C (!) 36.4 C  SpO2: 42% 395%    Complications: No apparent anesthesia complications

## 2022-10-03 NOTE — Interval H&P Note (Signed)
History and Physical Interval Note:  10/03/2022 10:38 AM  Morgan Frye  has presented today for surgery, with the diagnosis of ENDOMETRIAL CANCER.  The various methods of treatment have been discussed with the patient and family. After consideration of risks, benefits and other options for treatment, the patient has consented to  Procedure(s): XI ROBOTIC ASSISTED TOTAL HYSTERECTOMY WITH BILATERAL SALPINGO OOPHORECTOMY (N/A) SENTINEL NODE BIOPSY (N/A) POSSIBLE LYMPH NODE DISSECTION;POSSIBLE LAPAROTOMY (N/A) as a surgical intervention.  The patient's history has been reviewed, patient examined, no change in status, stable for surgery.  I have reviewed the patient's chart and labs.  Questions were answered to the patient's satisfaction.     Lafonda Mosses

## 2022-10-04 ENCOUNTER — Other Ambulatory Visit (HOSPITAL_COMMUNITY)
Admission: RE | Admit: 2022-10-04 | Discharge: 2022-10-04 | Disposition: A | Discharge status: Home / Self Care | Payer: Medicare PPO | Source: Ambulatory Visit | Attending: Gynecologic Oncology | Admitting: Gynecologic Oncology

## 2022-10-04 ENCOUNTER — Encounter (HOSPITAL_COMMUNITY): Payer: Self-pay | Admitting: Gynecologic Oncology

## 2022-10-04 ENCOUNTER — Telehealth: Payer: Self-pay | Admitting: Gynecologic Oncology

## 2022-10-04 ENCOUNTER — Other Ambulatory Visit: Payer: Self-pay | Admitting: Gynecologic Oncology

## 2022-10-04 ENCOUNTER — Inpatient Hospital Stay: Payer: Medicare PPO | Attending: Gynecologic Oncology

## 2022-10-04 ENCOUNTER — Encounter: Payer: Self-pay | Admitting: Gynecologic Oncology

## 2022-10-04 ENCOUNTER — Telehealth: Payer: Self-pay

## 2022-10-04 VITALS — BP 124/71 | HR 84 | Temp 98.3°F | Resp 18

## 2022-10-04 DIAGNOSIS — Z90710 Acquired absence of both cervix and uterus: Secondary | ICD-10-CM | POA: Insufficient documentation

## 2022-10-04 DIAGNOSIS — C541 Malignant neoplasm of endometrium: Secondary | ICD-10-CM | POA: Diagnosis not present

## 2022-10-04 DIAGNOSIS — R34 Anuria and oliguria: Secondary | ICD-10-CM | POA: Diagnosis not present

## 2022-10-04 DIAGNOSIS — E119 Type 2 diabetes mellitus without complications: Secondary | ICD-10-CM | POA: Insufficient documentation

## 2022-10-04 DIAGNOSIS — R339 Retention of urine, unspecified: Secondary | ICD-10-CM | POA: Diagnosis not present

## 2022-10-04 DIAGNOSIS — R310 Gross hematuria: Secondary | ICD-10-CM | POA: Insufficient documentation

## 2022-10-04 DIAGNOSIS — Z8042 Family history of malignant neoplasm of prostate: Secondary | ICD-10-CM | POA: Insufficient documentation

## 2022-10-04 DIAGNOSIS — I10 Essential (primary) hypertension: Secondary | ICD-10-CM | POA: Insufficient documentation

## 2022-10-04 LAB — CBC WITH DIFFERENTIAL/PLATELET
Abs Immature Granulocytes: 0.05 10*3/uL (ref 0.00–0.07)
Basophils Absolute: 0 10*3/uL (ref 0.0–0.1)
Basophils Relative: 0 %
Eosinophils Absolute: 0 10*3/uL (ref 0.0–0.5)
Eosinophils Relative: 0 %
HCT: 38.4 % (ref 36.0–46.0)
Hemoglobin: 13.3 g/dL (ref 12.0–15.0)
Immature Granulocytes: 0 %
Lymphocytes Relative: 12 %
Lymphs Abs: 1.6 10*3/uL (ref 0.7–4.0)
MCH: 32.1 pg (ref 26.0–34.0)
MCHC: 34.6 g/dL (ref 30.0–36.0)
MCV: 92.8 fL (ref 80.0–100.0)
Monocytes Absolute: 1.2 10*3/uL — ABNORMAL HIGH (ref 0.1–1.0)
Monocytes Relative: 9 %
Neutro Abs: 10.2 10*3/uL — ABNORMAL HIGH (ref 1.7–7.7)
Neutrophils Relative %: 79 %
Platelets: 216 10*3/uL (ref 150–400)
RBC: 4.14 MIL/uL (ref 3.87–5.11)
RDW: 13.7 % (ref 11.5–15.5)
WBC: 13.1 10*3/uL — ABNORMAL HIGH (ref 4.0–10.5)
nRBC: 0 % (ref 0.0–0.2)

## 2022-10-04 LAB — URINALYSIS, COMPLETE (UACMP) WITH MICROSCOPIC
Bilirubin Urine: NEGATIVE
Glucose, UA: 50 mg/dL — AB
Ketones, ur: NEGATIVE mg/dL
Leukocytes,Ua: NEGATIVE
Nitrite: NEGATIVE
Protein, ur: NEGATIVE mg/dL
Specific Gravity, Urine: 1.013 (ref 1.005–1.030)
pH: 5 (ref 5.0–8.0)

## 2022-10-04 LAB — BASIC METABOLIC PANEL
Anion gap: 8 (ref 5–15)
BUN: 24 mg/dL — ABNORMAL HIGH (ref 8–23)
CO2: 23 mmol/L (ref 22–32)
Calcium: 9.1 mg/dL (ref 8.9–10.3)
Chloride: 105 mmol/L (ref 98–111)
Creatinine, Ser: 0.89 mg/dL (ref 0.44–1.00)
GFR, Estimated: >60 mL/min (ref >60)
Glucose, Bld: 157 mg/dL — ABNORMAL HIGH (ref 70–99)
Potassium: 4 mmol/L (ref 3.5–5.1)
Sodium: 136 mmol/L (ref 135–145)

## 2022-10-04 MED ORDER — LIDOCAINE 5 % EX OINT: 1.0000 | TOPICAL_OINTMENT | CUTANEOUS | 0 refills | Status: DC | PRN

## 2022-10-04 NOTE — Telephone Encounter (Signed)
Spoke with Morgan Frye this morning. She states she is eating, drinking. She states she has pain and burning upon urinating, which she expects since the catheter was placed. She states she has a fullness but is "peeing very little".she is also using ice packs. She has not had a BM yet but is passing gas. She is taking senokot as prescribed and encouraged her to drink plenty of water. She denies fever or chills. Incisions are dry and intact. She rates her pain 3/10. Her pain is controlled with Tramadol and Motirn.   Pt aware burning is normal after procedure d/t having a catheter placed. Advised to use a squirt bottle with warm when she pees and to increase water intake and to monitor more today/this evening and the office would call tomorrow morning to follow up.   Dr. Berline Lopes notified.   Instructed to call office with any fever, chills, purulent drainage, uncontrolled pain or any other questions or concerns. Patient verbalizes understanding.   Pt aware of post op appointments as well as the office number [phone] and after hours number [phone] to call if she has any questions or concerns

## 2022-10-04 NOTE — Progress Notes (Signed)
Patient came in for foley catheter insertion due to urinary retention. Sterile technique maintained through procedure. Patient anatomy swollen, foley insertion unsuccessful on first attempt. New catheter kit obtained and foley insertion successful on second attempt. Patient tolerated procedure well. Education given on proper care of foley catheter. Instructed to call our office with any questions or concerns. Patient aware of return visit tomorrow with Morgan Frye. Patient verbalized understanding and had no concerns at this time.

## 2022-10-04 NOTE — Telephone Encounter (Signed)
Called patient. Labs are reassuring. She is doing ok since catheter placement, making good urine. Plan to send lidocaine in for external use.  Jeral Pinch MD Gynecologic Oncology

## 2022-10-05 ENCOUNTER — Ambulatory Visit (HOSPITAL_COMMUNITY)
Admission: RE | Admit: 2022-10-05 | Discharge: 2022-10-05 | Disposition: A | Discharge status: Home / Self Care | Payer: Medicare PPO | Source: Ambulatory Visit | Attending: Gynecologic Oncology | Admitting: Gynecologic Oncology

## 2022-10-05 ENCOUNTER — Inpatient Hospital Stay: Payer: Medicare PPO

## 2022-10-05 ENCOUNTER — Other Ambulatory Visit: Payer: Self-pay

## 2022-10-05 ENCOUNTER — Inpatient Hospital Stay (HOSPITAL_BASED_OUTPATIENT_CLINIC_OR_DEPARTMENT_OTHER): Payer: Medicare PPO | Admitting: Gynecologic Oncology

## 2022-10-05 VITALS — BP 105/62 | HR 80 | Temp 98.0°F | Resp 16 | Wt 233.0 lb

## 2022-10-05 DIAGNOSIS — M8580 Other specified disorders of bone density and structure, unspecified site: Secondary | ICD-10-CM | POA: Diagnosis not present

## 2022-10-05 DIAGNOSIS — R310 Gross hematuria: Secondary | ICD-10-CM

## 2022-10-05 DIAGNOSIS — C541 Malignant neoplasm of endometrium: Secondary | ICD-10-CM

## 2022-10-05 DIAGNOSIS — M96841 Postprocedural hematoma of a musculoskeletal structure following other procedure: Secondary | ICD-10-CM | POA: Diagnosis not present

## 2022-10-05 DIAGNOSIS — R319 Hematuria, unspecified: Secondary | ICD-10-CM | POA: Diagnosis not present

## 2022-10-05 DIAGNOSIS — Y839 Surgical procedure, unspecified as the cause of abnormal reaction of the patient, or of later complication, without mention of misadventure at the time of the procedure: Secondary | ICD-10-CM | POA: Diagnosis not present

## 2022-10-05 DIAGNOSIS — Z978 Presence of other specified devices: Secondary | ICD-10-CM

## 2022-10-05 DIAGNOSIS — S301XXA Contusion of abdominal wall, initial encounter: Secondary | ICD-10-CM | POA: Diagnosis not present

## 2022-10-05 LAB — COMPREHENSIVE METABOLIC PANEL
ALT: 18 U/L (ref 0–44)
AST: 23 U/L (ref 15–41)
Albumin: 4 g/dL (ref 3.5–5.0)
Alkaline Phosphatase: 60 U/L (ref 38–126)
Anion gap: 6 (ref 5–15)
BUN: 31 mg/dL — ABNORMAL HIGH (ref 8–23)
CO2: 25 mmol/L (ref 22–32)
Calcium: 9.4 mg/dL (ref 8.9–10.3)
Chloride: 103 mmol/L (ref 98–111)
Creatinine, Ser: 0.94 mg/dL (ref 0.44–1.00)
GFR, Estimated: >60 mL/min (ref >60)
Glucose, Bld: 178 mg/dL — ABNORMAL HIGH (ref 70–99)
Potassium: 4.1 mmol/L (ref 3.5–5.1)
Sodium: 134 mmol/L — ABNORMAL LOW (ref 135–145)
Total Bilirubin: 1.1 mg/dL (ref 0.3–1.2)
Total Protein: 6.3 g/dL — ABNORMAL LOW (ref 6.5–8.1)

## 2022-10-05 LAB — CBC (CANCER CENTER ONLY)
HCT: 37.3 % (ref 36.0–46.0)
Hemoglobin: 13.5 g/dL (ref 12.0–15.0)
MCH: 32.6 pg (ref 26.0–34.0)
MCHC: 36.2 g/dL — ABNORMAL HIGH (ref 30.0–36.0)
MCV: 90.1 fL (ref 80.0–100.0)
Platelet Count: 228 10*3/uL (ref 150–400)
RBC: 4.14 MIL/uL (ref 3.87–5.11)
RDW: 13.8 % (ref 11.5–15.5)
WBC Count: 12.3 10*3/uL — ABNORMAL HIGH (ref 4.0–10.5)
nRBC: 0 % (ref 0.0–0.2)

## 2022-10-05 LAB — URINE CULTURE: Culture: NO GROWTH

## 2022-10-05 MED ORDER — SODIUM CHLORIDE (PF) 0.9 % IJ SOLN
INTRAMUSCULAR | Status: AC
  Filled 2022-10-05: qty 50

## 2022-10-05 MED ORDER — SODIUM CHLORIDE 0.9 % IV SOLN
INTRAVENOUS | Status: AC
  Filled 2022-10-05: qty 500

## 2022-10-05 MED ORDER — IOHEXOL 300 MG/ML  SOLN
100.0000 mL | Freq: Once | INTRAMUSCULAR | Status: AC | PRN
  Administered 2022-10-05: 100 mL

## 2022-10-05 NOTE — Progress Notes (Unsigned)
Notcied bleeding in foley last pm. Has been emptying moderate amount. No fever chills. No vaginal bleeding.

## 2022-10-08 ENCOUNTER — Telehealth: Payer: Self-pay | Admitting: Gynecologic Oncology

## 2022-10-08 ENCOUNTER — Inpatient Hospital Stay: Payer: Medicare PPO

## 2022-10-08 ENCOUNTER — Telehealth: Payer: Self-pay | Admitting: Surgery

## 2022-10-08 ENCOUNTER — Inpatient Hospital Stay (HOSPITAL_BASED_OUTPATIENT_CLINIC_OR_DEPARTMENT_OTHER): Payer: Medicare PPO | Admitting: Gynecologic Oncology

## 2022-10-08 ENCOUNTER — Other Ambulatory Visit: Payer: Self-pay | Admitting: Gynecologic Oncology

## 2022-10-08 ENCOUNTER — Encounter: Payer: Self-pay | Admitting: Gynecologic Oncology

## 2022-10-08 DIAGNOSIS — I10 Essential (primary) hypertension: Secondary | ICD-10-CM | POA: Diagnosis not present

## 2022-10-08 DIAGNOSIS — R339 Retention of urine, unspecified: Secondary | ICD-10-CM

## 2022-10-08 DIAGNOSIS — C541 Malignant neoplasm of endometrium: Secondary | ICD-10-CM | POA: Diagnosis not present

## 2022-10-08 DIAGNOSIS — Z8042 Family history of malignant neoplasm of prostate: Secondary | ICD-10-CM | POA: Diagnosis not present

## 2022-10-08 DIAGNOSIS — R310 Gross hematuria: Secondary | ICD-10-CM

## 2022-10-08 DIAGNOSIS — Z90722 Acquired absence of ovaries, bilateral: Secondary | ICD-10-CM

## 2022-10-08 DIAGNOSIS — D62 Acute posthemorrhagic anemia: Secondary | ICD-10-CM

## 2022-10-08 DIAGNOSIS — E119 Type 2 diabetes mellitus without complications: Secondary | ICD-10-CM | POA: Diagnosis not present

## 2022-10-08 DIAGNOSIS — Z90710 Acquired absence of both cervix and uterus: Secondary | ICD-10-CM

## 2022-10-08 DIAGNOSIS — R34 Anuria and oliguria: Secondary | ICD-10-CM

## 2022-10-08 DIAGNOSIS — Z7189 Other specified counseling: Secondary | ICD-10-CM

## 2022-10-08 LAB — URINALYSIS, COMPLETE (UACMP) WITH MICROSCOPIC
Bacteria, UA: NONE SEEN
Bilirubin Urine: NEGATIVE
Glucose, UA: NEGATIVE mg/dL
Ketones, ur: NEGATIVE mg/dL
Nitrite: NEGATIVE
Protein, ur: NEGATIVE mg/dL
Specific Gravity, Urine: 1.017 (ref 1.005–1.030)
pH: 5 (ref 5.0–8.0)

## 2022-10-08 LAB — BASIC METABOLIC PANEL
Anion gap: 5 (ref 5–15)
BUN: 14 mg/dL (ref 8–23)
CO2: 29 mmol/L (ref 22–32)
Calcium: 9.3 mg/dL (ref 8.9–10.3)
Chloride: 105 mmol/L (ref 98–111)
Creatinine, Ser: 0.65 mg/dL (ref 0.44–1.00)
GFR, Estimated: >60 mL/min (ref >60)
Glucose, Bld: 178 mg/dL — ABNORMAL HIGH (ref 70–99)
Potassium: 3.8 mmol/L (ref 3.5–5.1)
Sodium: 139 mmol/L (ref 135–145)

## 2022-10-08 LAB — CBC (CANCER CENTER ONLY)
HCT: 32.3 % — ABNORMAL LOW (ref 36.0–46.0)
Hemoglobin: 11.6 g/dL — ABNORMAL LOW (ref 12.0–15.0)
MCH: 32.8 pg (ref 26.0–34.0)
MCHC: 35.9 g/dL (ref 30.0–36.0)
MCV: 91.2 fL (ref 80.0–100.0)
Platelet Count: 193 10*3/uL (ref 150–400)
RBC: 3.54 MIL/uL — ABNORMAL LOW (ref 3.87–5.11)
RDW: 13.4 % (ref 11.5–15.5)
WBC Count: 7.7 10*3/uL (ref 4.0–10.5)
nRBC: 0 % (ref 0.0–0.2)

## 2022-10-08 LAB — SURGICAL PATHOLOGY

## 2022-10-08 NOTE — Telephone Encounter (Signed)
Called patient to see how she's doing. Patient states she is doing much better. No more burning with urination, no more blood in urine, and no more urinary retention. Patient had no questions or concerns at this time.

## 2022-10-08 NOTE — Progress Notes (Signed)
Gynecologic Oncology Telehealth Note: Gyn-Onc  I connected with Morgan Frye on 10/08/22 at  3:05 PM EST by telephone and verified that I am speaking with the correct person using two identifiers.  I discussed the limitations, risks, security and privacy concerns of performing an evaluation and management service by telemedicine and the availability of in-person appointments. I also discussed with the patient that there may be a patient responsible charge related to this service. The patient expressed understanding and agreed to proceed.  Other persons participating in the visit and their role in the encounter: none.  Patient's location: home Provider's location:   Reason for Visit: follow-up after surgery  Treatment History: 12/6: Robotic-assisted laparoscopic total hysterectomy with bilateral salpingoophorectomy, SLN biopsy bilaterally   Interval History: Doing well. Denies any vaginal bleeding or blood in urine after foley removed. Having good bowel function. Denies significant abdominal pain.  Minimal dizziness when stands, very short-lived, otherwise ambulating without any difficulty. Was out running errands with her husband today.  Past Medical/Surgical History: Past Medical History:  Diagnosis Date   Arthritis    osteoarthritis- knees, hips, rt. hip bursitis   Diabetes mellitus type 2 in obese (HCC)    Hemangioma of liver    very small- evaluated and considered stable- no further problems or follow up in many years   History of kidney stones    x1 lithotripsy-has others not a bother at this time   History of palpitations    during menopause, on metoprolol. Saw cardiologist   Hypertension    Obesity (BMI 30-39.9)    Transfusion history    '1981- s/p childbirth    Past Surgical History:  Procedure Laterality Date   CATARACT EXTRACTION, BILATERAL Bilateral    5 yrs ago   [place] OF UTERUS  2010   perimenopause, caused bleeding, had polyp removed    LITHOTRIPSY     ROBOTIC ASSISTED TOTAL HYSTERECTOMY WITH BILATERAL SALPINGO OOPHERECTOMY N/A 10/03/2022   Procedure: XI ROBOTIC ASSISTED TOTAL HYSTERECTOMY WITH BILATERAL SALPINGO OOPHORECTOMY;  Surgeon: Lafonda Mosses, MD;  Location: [facility];  Service: Gynecology;  Laterality: N/A;   SENTINEL NODE BIOPSY N/A 10/03/2022   Procedure: SENTINEL NODE BIOPSY;  Surgeon: Lafonda Mosses, MD;  Location: [facility];  Service: Gynecology;  Laterality: N/A;   TONSILLECTOMY     child   TOTAL KNEE ARTHROPLASTY Right 05/24/2014   Procedure: RIGHT TOTAL KNEE ARTHROPLASTY;  Surgeon: Gearlean Alf, MD;  Location: [facility];  Service: Orthopedics;  Laterality: Right;   TOTAL KNEE ARTHROPLASTY Left 05/30/2015   Procedure: LEFT TOTAL KNEE ARTHROPLASTY;  Surgeon: Gaynelle Arabian, MD;  Location: [facility];  Service: Orthopedics;  Laterality: Left;    Family History  Problem Relation Age of Onset   Liver disease Mother    Heart disease Father        ICD.  Father is 70   Prostate cancer Father    Colon cancer Neg Hx    Breast cancer Neg Hx    Ovarian cancer Neg Hx    Endometrial cancer Neg Hx    Pancreatic cancer Neg Hx     Social History   Socioeconomic History   Marital status: Married    Spouse name: Not on file   Number of children: 2   Years of education: Not on file   Highest education level: Not on file  Occupational History   Occupation: retired  Tobacco Use   Smoking status: Never   Smokeless tobacco: Never  Vaping Use   Vaping Use: Never used  Substance and Sexual Activity   Alcohol use: Never   Drug use: No   Sexual activity: Yes  Other Topics Concern   Not on file  Social History Narrative   Lives with husband.     Social Determinants of Health   Financial Resource Strain: Low Risk  (09/13/2022)   Overall Financial Resource Strain (CARDIA)    Difficulty of Paying Living Expenses: Not hard at all  Food Insecurity: No Food Insecurity (09/13/2022)   Hunger Vital Sign     Worried About Running Out of Food in the Last Year: Never true    Ran Out of Food in the Last Year: Never true  Transportation Needs: No Transportation Needs (09/13/2022)   PRAPARE - Hydrologist (Medical): No    Lack of Transportation (Non-Medical): No  Physical Activity: Not on file  Stress: Not on file  Social Connections: Not on file    Current Medications:  Current Outpatient Medications:    Accu-Chek FastClix Lancets MISC, Apply topically daily., Disp: , Rfl:    ACCU-CHEK GUIDE test strip, daily., Disp: , Rfl:    aspirin EC 81 MG tablet, Take 1 tablet (81 mg total) by mouth at bedtime. Swallow whole., Disp: 30 tablet, Rfl: 12   atorvastatin (LIPITOR) 20 MG tablet, Take 20 mg by mouth at bedtime., Disp: , Rfl:    Calcium Carb-Cholecalciferol (CALCIUM + VITAMIN D3 PO), Take 1 tablet by mouth every evening., Disp: , Rfl:    cetirizine (ZYRTEC) 10 MG tablet, Take 10 mg by mouth daily., Disp: , Rfl:    Cholecalciferol (VITAMIN D3) 50 MCG (2000 UT) CAPS, Take 2,000 Units by mouth daily., Disp: , Rfl:    ciclopirox (LOPROX) 0.77 % cream, Apply 1 application  topically 2 (two) times daily as needed (yeast under breasts)., Disp: , Rfl:    lidocaine (XYLOCAINE) 5 % ointment, Apply 1 Application topically as needed., Disp: 35.44 g, Rfl: 0   lisinopril-hydrochlorothiazide (PRINZIDE,ZESTORETIC) 20-12.5 MG per tablet, Take 1 tablet by mouth every morning., Disp: , Rfl:    metFORMIN (GLUCOPHAGE-XR) 500 MG 24 hr tablet, Take 500 mg by mouth every evening., Disp: , Rfl:    metoprolol succinate (TOPROL-XL) 50 MG 24 hr tablet, Take 50 mg by mouth every evening. Take with or immediately following a meal., Disp: , Rfl:    Multiple Vitamin (MULTIVITAMIN) tablet, Take 1 tablet by mouth daily., Disp: , Rfl:    senna-docusate (SENOKOT-S) 8.6-50 MG tablet, Take 2 tablets by mouth at bedtime. For AFTER surgery only, do not take if having diarrrhea, Disp: 30 tablet, Rfl: 0    traMADol (ULTRAM) 50 MG tablet, Take 1 tablet (50 mg total) by mouth every 6 (six) hours as needed for severe pain. For AFTER surgery only, do not take and drive, Disp: 10 tablet, Rfl: 0  Review of Symptoms: Pertinent positives as per HPI.  Physical Exam: Deferred given limitations of phone visit.  Laboratory & Radiologic Studies: A. LYMPH NODE, SENTINEL, RIGHT OBTURATOR, BIOPSY: - Negative for carcinoma (0/1)  B. LYMPH NODE, SENTINEL, RIGHT EXTERNAL ILIAC, BIOPSY: - Negative for carcinoma (0/1)  C. LYMPH NODE, SENTINEL, LEFT OBTURATOR, BIOPSY: - Negative for carcinoma (0/1)  D. LYMPH NODE, SENTINEL, LEFT OBTURATOR AND ADJACENT NON GREEN LYMPH NODES: - Negative for carcinoma (0/1)  E. UTERUS, CERVIX, BILATERAL FALLOPIAN TUBES AND OVARIES: - Endometrioid carcinoma, FIGO grade 1, pT1a - Myometrial invasion less than 50% - No lymphovascular  invasion identified - Cervix: Benign, nabothian cyst - Bilateral fallopian tubes: Benign, paratubal cysts - Bilateral ovaries: No significant pathologic changes - See oncology table  ONCOLOGY TABLE:   UTERUS, CARCINOMA OR CARCINOSARCOMA: Resection  Procedure: Total hysterectomy and bilateral salpingo-oophorectomy Histologic Type: Endometrioid adenocarcinoma Histologic Grade: FIGO grade 1 Myometrial Invasion:      Depth of Myometrial Invasion (mm): 3 mm      Myometrial Thickness (mm): 15 mm      Percentage of Myometrial Invasion: 20% Uterine Serosa Involvement: Not identified Cervical stromal Involvement: Not identified Extent of involvement of other tissue/organs: Not applicable Peritoneal/Ascitic Fluid: Not submitted Lymphovascular Invasion: Not identified Regional Lymph Nodes:      Pelvic Lymph Nodes Examined: 4 Sentinel                               0 non-sentinel                                  4: total      Pelvic Lymph Nodes with Metastasis: 0 Distant Metastasis:      Distant Site(s) Involved: Not applicable Pathologic  Stage Classification (pTNM, AJCC 8th Edition): pT1a, pN0 Ancillary Studies: MMR / MSI testing will be ordered Representative Tumor Block: E8 Comment(s): Appropriately controlled p53 immunohistochemical stain shows wild-type staining - Pancytokeratin stain was performed on the lymph nodes and is negative.   Assessment & Plan: Morgan Frye is a 71 y.o. 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  Doing well. Suspect drop in H&H seen on blood work today represents post-op bleeding as evidence by blood along left pelvic sidewall on CT Friday. Labs on Thursday and Friday likely prior to equilibration. Will plan to recheck CBC on Friday. Patient with minimal dizziness, knows to call if any symptoms worsen. She otherwise is feeling better each day.  Discussed pathology from surgery. She is very happy with this news. Given low-risk disease, early stage, no adjuvant treatment recommended. MSI/MMR still pending.   I discussed the assessment and treatment plan with the patient. The patient was provided with an opportunity to ask questions and all were answered. The patient agreed with the plan and demonstrated an understanding of the instructions.   The patient was advised to call back or see an in-person evaluation if the symptoms worsen or if the condition fails to improve as anticipated.   8 minutes of total time was spent for this patient encounter, including preparation, phone counseling with the patient and coordination of care, and documentation of the encounter.   Jeral Pinch, MD  Division of Gynecologic Oncology  Department of Obstetrics and Gynecology  [facility]

## 2022-10-08 NOTE — Telephone Encounter (Signed)
Lab ordered placed for Friday. Patient will look for appt time on mychart

## 2022-10-09 ENCOUNTER — Telehealth: Payer: Self-pay

## 2022-10-09 NOTE — Telephone Encounter (Signed)
Per [name], NP. Pt needs credit for urinalysis/culture collected by [facility] lab on 10/08/22. Initial collection was last week.   Diane in [facility] lab aware and states order should have been dc'd but that she would take care of it.

## 2022-10-10 ENCOUNTER — Other Ambulatory Visit: Payer: Self-pay | Admitting: Gynecologic Oncology

## 2022-10-10 ENCOUNTER — Telehealth: Payer: Self-pay

## 2022-10-10 DIAGNOSIS — N390 Urinary tract infection, site not specified: Secondary | ICD-10-CM

## 2022-10-10 MED ORDER — CIPROFLOXACIN HCL 250 MG PO TABS: 250.0000 mg | ORAL_TABLET | Freq: Two times a day (BID) | ORAL | 0 refills | Status: DC

## 2022-10-10 NOTE — Progress Notes (Signed)
Urine culture obtained from bladder at foley insertion negative. Patient had catheter removed on 10/05/22. She presented to the office on Monday for repeat labs and a urine sample was obtained. Based on the sample, the patient had 70,000 colonies of enterbacter cloacae and 20, 000 colonies E coli. Per Dr. Berline Lopes, plan to treat. Patient has take cipro in the past and tolerated well.

## 2022-10-10 NOTE — Telephone Encounter (Signed)
I connected with Ms. Bottger regarding the recent urinalysis.   Per [name] NP, the urine sample that she gave earlier in the week after catheter removal is showing a urinary tract infection. Based on the culture results, we will need to treat with Cipro.   Pt states not really any UTI S&S, she states she has noticed no longer stinging upon peeing, but she is peeing less and urine is darker. Advised her to start hydrating more. She voiced an understanding.   I reviewed side effects of Cipro. (Potential side effects include: Disabling and potentially irreversible serious adverse reactions associated with quinolones include tendinitis, tendon rupture, peripheral neuropathy, and central nervous system effects. Other significant adverse events include potential QT prolongation and myasthenia gravis exacerbation.   She states she has been on it before with no problems. Advised to call office or after hours line if she starts having any of the reviewed side effects.  She voiced an understanding.

## 2022-10-11 ENCOUNTER — Encounter: Payer: Self-pay | Admitting: Gynecologic Oncology

## 2022-10-11 ENCOUNTER — Telehealth: Payer: Medicare PPO | Admitting: Gynecologic Oncology

## 2022-10-11 LAB — URINE CULTURE: Culture: 70000 — AB

## 2022-10-11 NOTE — Progress Notes (Signed)
Gynecologic Oncology Follow Up  Patient presents to the office after having CT cystogram. No evidence of bladder leak. Other findings include: 2. Moderately large LEFT pelvic hematoma in this patient post recent surgery. Correlate with any new symptoms that would suggest any ongoing bleeding. This could simply be postoperative but is nonspecific on isolated imaging. 3. Hematoma appears separate from the urinary bladder but does exert some mass effect upon the adjacent urinary bladder. Clear fat plane between the structures on CT. 4. Extraperitoneal gas following recent laparoscopic procedure. Also signs of mild body wall edema, nonspecific findings in the recent postoperative setting.  Radiologist discussed findings with Dr. Berline Lopes with plan for removal of foley. Foley removed at 17:05 pm without difficulty. Reportable signs and symptoms reviewed. Dr. Berline Lopes will plan to reach out to the patient over the weekend to check in. After hours contact information given. No needs or concerns voiced per pt. Will plan for repeat labs at the beginning of the week to assess for stability.

## 2022-10-12 ENCOUNTER — Telehealth: Payer: Self-pay

## 2022-10-12 ENCOUNTER — Inpatient Hospital Stay: Payer: Medicare PPO

## 2022-10-12 DIAGNOSIS — E119 Type 2 diabetes mellitus without complications: Secondary | ICD-10-CM | POA: Diagnosis not present

## 2022-10-12 DIAGNOSIS — Z8042 Family history of malignant neoplasm of prostate: Secondary | ICD-10-CM | POA: Diagnosis not present

## 2022-10-12 DIAGNOSIS — D62 Acute posthemorrhagic anemia: Secondary | ICD-10-CM

## 2022-10-12 DIAGNOSIS — I10 Essential (primary) hypertension: Secondary | ICD-10-CM | POA: Diagnosis not present

## 2022-10-12 DIAGNOSIS — R310 Gross hematuria: Secondary | ICD-10-CM | POA: Diagnosis not present

## 2022-10-12 DIAGNOSIS — Z90710 Acquired absence of both cervix and uterus: Secondary | ICD-10-CM | POA: Diagnosis not present

## 2022-10-12 DIAGNOSIS — C541 Malignant neoplasm of endometrium: Secondary | ICD-10-CM | POA: Diagnosis not present

## 2022-10-12 DIAGNOSIS — R34 Anuria and oliguria: Secondary | ICD-10-CM

## 2022-10-12 LAB — CBC WITH DIFFERENTIAL (CANCER CENTER ONLY)
Abs Immature Granulocytes: 0.08 10*3/uL — ABNORMAL HIGH (ref 0.00–0.07)
Basophils Absolute: 0 10*3/uL (ref 0.0–0.1)
Basophils Relative: 1 %
Eosinophils Absolute: 0.2 10*3/uL (ref 0.0–0.5)
Eosinophils Relative: 3 %
HCT: 33.1 % — ABNORMAL LOW (ref 36.0–46.0)
Hemoglobin: 11.7 g/dL — ABNORMAL LOW (ref 12.0–15.0)
Immature Granulocytes: 1 %
Lymphocytes Relative: 16 %
Lymphs Abs: 1 10*3/uL (ref 0.7–4.0)
MCH: 32.5 pg (ref 26.0–34.0)
MCHC: 35.3 g/dL (ref 30.0–36.0)
MCV: 91.9 fL (ref 80.0–100.0)
Monocytes Absolute: 0.6 10*3/uL (ref 0.1–1.0)
Monocytes Relative: 9 %
Neutro Abs: 4.6 10*3/uL (ref 1.7–7.7)
Neutrophils Relative %: 70 %
Platelet Count: 219 10*3/uL (ref 150–400)
RBC: 3.6 MIL/uL — ABNORMAL LOW (ref 3.87–5.11)
RDW: 14.4 % (ref 11.5–15.5)
WBC Count: 6.5 10*3/uL (ref 4.0–10.5)
nRBC: 0 % (ref 0.0–0.2)

## 2022-10-12 NOTE — Telephone Encounter (Signed)
LVM for patient to call office regarding recent lab results.   Per Morgan John NP, labs are stable. Also, follow up on urine S&S, is urine still clear?

## 2022-10-12 NOTE — Telephone Encounter (Signed)
Thanks Kim. I also called the patient to check in. Reports easier to void and resolution of minimal stinging she was having with urination. Bruising improving.

## 2022-10-12 NOTE — Telephone Encounter (Signed)
Pt returning call. She is aware of stable lab results. She wanted me to tell Melissa the Cipro has made a huge difference. She didn't realize she had a UTI but there is no longer any stinging when she goes. Urine is still clear.  She said a huge thank you and wanted to wish everyone a Merry Christmas.

## 2022-10-17 ENCOUNTER — Encounter (HOSPITAL_COMMUNITY): Payer: Self-pay | Admitting: Gynecologic Oncology

## 2022-10-23 ENCOUNTER — Encounter: Payer: Self-pay | Admitting: Gynecologic Oncology

## 2022-10-26 ENCOUNTER — Encounter: Payer: Self-pay | Admitting: Gynecologic Oncology

## 2022-10-26 ENCOUNTER — Inpatient Hospital Stay (HOSPITAL_BASED_OUTPATIENT_CLINIC_OR_DEPARTMENT_OTHER): Payer: Medicare PPO | Admitting: Gynecologic Oncology

## 2022-10-26 VITALS — BP 130/64 | HR 72 | Temp 98.3°F | Resp 16 | Ht 64.0 in | Wt 243.5 lb

## 2022-10-26 DIAGNOSIS — Z90722 Acquired absence of ovaries, bilateral: Secondary | ICD-10-CM

## 2022-10-26 DIAGNOSIS — Z7189 Other specified counseling: Secondary | ICD-10-CM

## 2022-10-26 DIAGNOSIS — Z90710 Acquired absence of both cervix and uterus: Secondary | ICD-10-CM

## 2022-10-26 DIAGNOSIS — C541 Malignant neoplasm of endometrium: Secondary | ICD-10-CM

## 2022-10-26 DIAGNOSIS — R600 Localized edema: Secondary | ICD-10-CM

## 2022-10-26 NOTE — Progress Notes (Signed)
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 No evidence of metastatic disease in the abdomen or pelvis. No obvious endometrial or cervical mass demonstrated. 2. 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 3. Cholelithiasis without evidence of cholecystitis. 4. Nonobstructing bilateral renal calculi. 5.  Aortic Atherosclerosis (ICD10-I70.   10/03/2022 Surgery   Robotic-assisted laparoscopic total hysterectomy with bilateral salpingoophorectomy, SLN biopsy bilaterally    Findings:  On EUA, 8 cm mobile uterus. Small cervix flush with the vaginal apex. On intra-abdominal entry, normal upper abdominal survey. Normal small and large bowel. Multiple fecaliths within the pelvis (removed). Uterus 8cm and normal in appearance although posterior serosa tore very easily with any sort of manipulation concerning for possible deeper myometrial invasion. Bilateral ovaries and tubes normal in appearance. SLN mapping successful to two pelvic lymph nodes bilaterally. Some fibrosis of the retroperitoneum, more on the left than right. Dense adhesions between the bladder and LUS/cervix.     10/03/2022 Pathology Results   A. LYMPH NODE, SENTINEL, RIGHT OBTURATOR, BIOPSY: - Negative for carcinoma (0/1)  B. LYMPH NODE, SENTINEL, RIGHT EXTERNAL ILIAC, BIOPSY: - Negative for carcinoma (0/1)  C. LYMPH NODE, SENTINEL, LEFT OBTURATOR, BIOPSY: - Negative for carcinoma (0/1)  D. LYMPH NODE, SENTINEL, LEFT OBTURATOR AND ADJACENT NON GREEN LYMPH NODES: - Negative for carcinoma  (0/1)  E. UTERUS, CERVIX, BILATERAL FALLOPIAN TUBES AND OVARIES: - Endometrioid carcinoma, FIGO grade 1, pT1a - Myometrial invasion less than 50% - No lymphovascular invasion identified - Cervix: Benign, nabothian cyst - Bilateral fallopian tubes: Benign, paratubal cysts - Bilateral ovaries: No significant pathologic changes - See oncology table  ONCOLOGY TABLE:   UTERUS, CARCINOMA OR CARCINOSARCOMA: Resection  Procedure: Total hysterectomy and bilateral salpingo-oophorectomy Histologic Type: Endometrioid adenocarcinoma Histologic Grade: FIGO grade 1 Myometrial Invasion:      Depth of Myometrial Invasion (mm): 3 mm      Myometrial Thickness (mm): 15 mm      Percentage of Myometrial Invasion: 20% Uterine Serosa Involvement: Not identified Cervical stromal Involvement: Not identified Extent of involvement of other tissue/organs: Not applicable Peritoneal/Ascitic Fluid: Not submitted Lymphovascular Invasion: Not identified Regional Lymph Nodes:      Pelvic Lymph Nodes Examined: 4 Sentinel                               0 non-sentinel                                  4: total      Pelvic Lymph Nodes with Metastasis: 0 Distant Metastasis:      Distant Site(s) Involved: Not applicable Pathologic Stage Classification (pTNM, AJCC 8th Edition): pT1a, pN0 Ancillary Studies: MMR / MSI testing will be ordered Representative Tumor Block: E8 Comment(s): Appropriately controlled p53 immunohistochemical stain shows wild-type staining - Pancytokeratin stain was performed on the lymph nodes and is negative.      Interval History: Doing well.  Overall feeling much better.  Is no longer dizzy when she stands.  She denies  any dysuria, feels like she is emptying her bladder.  Has occasional leaking with sneezing and coughing.  Reports bowel function is normal, using Senokot still as needed.  Bruising around incisions and across her lower abdomen significantly improved.  Denies any vaginal  bleeding or discharge.  Past Medical/Surgical History: Past Medical History:  Diagnosis Date   Arthritis    osteoarthritis- knees, hips, rt. hip bursitis   Diabetes mellitus type 2 in obese (HCC)    Hemangioma of liver    very small- evaluated and considered stable- no further problems or follow up in many years   History of kidney stones    x1 lithotripsy-has others not a bother at this time   History of palpitations    during menopause, on metoprolol. Saw cardiologist   Hypertension    Obesity (BMI 30-39.9)    Transfusion history    '1981- s/p childbirth    Past Surgical History:  Procedure Laterality Date   CATARACT EXTRACTION, BILATERAL Bilateral    5 yrs ago   [place] OF UTERUS  2010   perimenopause, caused bleeding, had polyp removed   LITHOTRIPSY     ROBOTIC ASSISTED TOTAL HYSTERECTOMY WITH BILATERAL SALPINGO OOPHERECTOMY N/A 10/03/2022   Procedure: XI ROBOTIC ASSISTED TOTAL HYSTERECTOMY WITH BILATERAL SALPINGO OOPHORECTOMY;  Surgeon: Lafonda Mosses, MD;  Location: [facility];  Service: Gynecology;  Laterality: N/A;   SENTINEL NODE BIOPSY N/A 10/03/2022   Procedure: SENTINEL NODE BIOPSY;  Surgeon: Lafonda Mosses, MD;  Location: [facility];  Service: Gynecology;  Laterality: N/A;   TONSILLECTOMY     child   TOTAL KNEE ARTHROPLASTY Right 05/24/2014   Procedure: RIGHT TOTAL KNEE ARTHROPLASTY;  Surgeon: Gearlean Alf, MD;  Location: [facility];  Service: Orthopedics;  Laterality: Right;   TOTAL KNEE ARTHROPLASTY Left 05/30/2015   Procedure: LEFT TOTAL KNEE ARTHROPLASTY;  Surgeon: Gaynelle Arabian, MD;  Location: [facility];  Service: Orthopedics;  Laterality: Left;    Family History  Problem Relation Age of Onset   Liver disease Mother    Heart disease Father        ICD.  Father is 37   Prostate cancer Father    Colon cancer Neg Hx    Breast cancer Neg Hx    Ovarian cancer Neg Hx    Endometrial cancer Neg Hx    Pancreatic cancer Neg Hx     Social History    Socioeconomic History   Marital status: Married    Spouse name: Not on file   Number of children: 2   Years of education: Not on file   Highest education level: Not on file  Occupational History   Occupation: retired  Tobacco Use   Smoking status: Never   Smokeless tobacco: Never  Vaping Use   Vaping Use: Never used  Substance and Sexual Activity   Alcohol use: Never   Drug use: No   Sexual activity: Yes  Other Topics Concern   Not on file  Social History Narrative   Lives with husband.     Social Determinants of Health   Financial Resource Strain: Low Risk  (09/13/2022)   Overall Financial Resource Strain (CARDIA)    Difficulty of Paying Living Expenses: Not hard at all  Food Insecurity: No Food Insecurity (09/13/2022)   Hunger Vital Sign    Worried About Running Out of Food in the Last Year: Never true    Ran Out of Food in the Last Year: Never true  Transportation Needs:  No Transportation Needs (09/13/2022)   PRAPARE - Hydrologist (Medical): No    Lack of Transportation (Non-Medical): No  Physical Activity: Not on file  Stress: Not on file  Social Connections: Not on file    Current Medications:  Current Outpatient Medications:    Accu-Chek FastClix Lancets MISC, Apply topically daily., Disp: , Rfl:    ACCU-CHEK GUIDE test strip, daily., Disp: , Rfl:    aspirin EC 81 MG tablet, Take 1 tablet (81 mg total) by mouth at bedtime. Swallow whole., Disp: 30 tablet, Rfl: 12   atorvastatin (LIPITOR) 20 MG tablet, Take 20 mg by mouth at bedtime., Disp: , Rfl:    Calcium Carb-Cholecalciferol (CALCIUM + VITAMIN D3 PO), Take 1 tablet by mouth every evening., Disp: , Rfl:    cetirizine (ZYRTEC) 10 MG tablet, Take 10 mg by mouth daily., Disp: , Rfl:    Cholecalciferol (VITAMIN D3) 50 MCG (2000 UT) CAPS, Take 2,000 Units by mouth daily., Disp: , Rfl:    ciclopirox (LOPROX) 0.77 % cream, Apply 1 application  topically 2 (two) times daily as needed  (yeast under breasts)., Disp: , Rfl:    ciprofloxacin (CIPRO) 250 MG tablet, Take 1 tablet (250 mg total) by mouth 2 (two) times daily., Disp: 6 tablet, Rfl: 0   lidocaine (XYLOCAINE) 5 % ointment, Apply 1 Application topically as needed., Disp: 35.44 g, Rfl: 0   lisinopril-hydrochlorothiazide (PRINZIDE,ZESTORETIC) 20-12.5 MG per tablet, Take 1 tablet by mouth every morning., Disp: , Rfl:    metFORMIN (GLUCOPHAGE-XR) 500 MG 24 hr tablet, Take 500 mg by mouth every evening., Disp: , Rfl:    metoprolol succinate (TOPROL-XL) 50 MG 24 hr tablet, Take 50 mg by mouth every evening. Take with or immediately following a meal., Disp: , Rfl:    Multiple Vitamin (MULTIVITAMIN) tablet, Take 1 tablet by mouth daily., Disp: , Rfl:    senna-docusate (SENOKOT-S) 8.6-50 MG tablet, Take 2 tablets by mouth at bedtime. For AFTER surgery only, do not take if having diarrrhea, Disp: 30 tablet, Rfl: 0   traMADol (ULTRAM) 50 MG tablet, Take 1 tablet (50 mg total) by mouth every 6 (six) hours as needed for severe pain. For AFTER surgery only, do not take and drive, Disp: 10 tablet, Rfl: 0  Review of Systems: Denies appetite changes, fevers, chills, fatigue, unexplained weight changes. Denies hearing loss, neck lumps or masses, mouth sores, ringing in ears or voice changes. Denies cough or wheezing.  Denies shortness of breath. Denies chest pain or palpitations. Denies leg swelling. Denies abdominal distention, pain, blood in stools, constipation, diarrhea, nausea, vomiting, or early satiety. Denies pain with intercourse, dysuria, frequency, hematuria or incontinence. Denies hot flashes, pelvic pain, vaginal bleeding or vaginal discharge.   Denies joint pain, back pain or muscle pain/cramps. Denies itching, rash, or wounds. Denies dizziness, headaches, numbness or seizures. Denies swollen lymph nodes or glands, denies easy bruising or bleeding. Denies anxiety, depression, confusion, or decreased  concentration.  Physical Exam: BP 130/64 (BP Location: Left Arm, Patient Position: Sitting)   Pulse 72   Temp 98.3 F (36.8 C) (Oral)   Resp 16   Ht _0  (1.626 m)   Wt 243 lb 8 oz (110.5 kg)   SpO2 100%   BMI 41.80 kg/m  General: Alert, oriented, no acute distress. HEENT: Normocephalic, atraumatic, sclera anicteric. Chest: Unlabored breathing on room air. Abdomen: Obese, soft, nontender.  Normoactive bowel sounds.  No masses or hepatosplenomegaly appreciated.  Well-healed incisions.  Very minimal peri-incisional bruising.  Some edema across dependent portion of her pannus. Extremities: Grossly normal range of motion.  Warm, well perfused.  Trace edema of right lower extremity, 1+ edema of left lower extremity mainly around the ankle.  No erythema or tenderness with palpation. GU: Normal appearing external genitalia without erythema, excoriation, or lesions.  Significant change in architecture of the labia as well as thinning of the skin, likely consistent with lichen sclerosis.  Speculum exam reveals cuff is intact, suture visible, no bleeding.  Bimanual exam reveals no fluctuance or tenderness with palpation.    Laboratory & Radiologic Studies: None new  Assessment & Plan: Morgan Frye is a 71 y.o. 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 MLH1 promoter hypermethylation testing is absent.  Patient is overall doing well and meeting postoperative milestones.  Has had significant improvement in terms of how she is feeling.  Given asymmetric swelling of her left lower extremity, discussed recommendation for Doppler to rule out DVT.  My suspicion is low, she endorses having similar edema on the left side after her left knee surgery that took several months to resolve.  But given her recent major surgery as well as cancer diagnosis, she is at increased risk of VTE.  Reviewed continued expectations  and restrictions.  I discussed pathology from surgery with her again.  She was given a copy of her pathology report.  We discussed that given early stage, low risk disease, no adjuvant treatment is indicated we reviewed additional testing on the tumor which suggest that she may have Lynch syndrome.  I have placed a referral to genetics.  Per NCCN surveillance recommendations, we will plan on surveillance visits every 6 months for 5 years and then yearly after that.  We will plan to alternate visits between my office and her OB/GYN.  Her first visit will be with me in 6 months.  22 minutes of total time was spent for this patient encounter, including preparation, face-to-face counseling with the patient and coordination of care, and documentation of the encounter.  Jeral Pinch, MD  Division of Gynecologic Oncology  Department of Obstetrics and Gynecology  [facility]

## 2022-10-26 NOTE — Patient Instructions (Signed)
It was good to see you today.  You are healing well from surgery.  Please remember, no heavy lifting for 6 weeks after surgery and nothing in the vagina for 8-10.  I have placed a referral for you to see one of our genetic counselors to talk about genetic testing.  I will plan to see you for follow-up in 6 months.  If you develop any of the signs and symptoms that we discussed at your visit today before your next visit with me, please call to see me sooner.  I will let you know when I get the results from your leg Doppler to look for possible blood clot.

## 2022-10-30 ENCOUNTER — Telehealth: Payer: Self-pay

## 2022-10-30 ENCOUNTER — Ambulatory Visit (HOSPITAL_COMMUNITY)
Admission: RE | Admit: 2022-10-30 | Discharge: 2022-10-30 | Disposition: A | Discharge status: Home / Self Care | Payer: Medicare PPO | Source: Ambulatory Visit | Attending: Gynecologic Oncology | Admitting: Gynecologic Oncology

## 2022-10-30 DIAGNOSIS — R600 Localized edema: Secondary | ICD-10-CM | POA: Insufficient documentation

## 2022-10-30 NOTE — Progress Notes (Signed)
Left lower extremity venous duplex has been completed. Preliminary results can be found in CV Proc through chart review.  Results were given to Maudie Mercury at Dr. Charisse March office.  10/30/22 9:48 AM Carlos Levering RVT

## 2022-10-30 NOTE — Telephone Encounter (Signed)
Marya Amsler from [facility] calling with report of LE Venous of left leg.  Negative for DVT.  Dr. Berline Lopes notified

## 2022-10-30 NOTE — Telephone Encounter (Signed)
Pt is aware of negative doppler for DVT

## 2022-10-30 NOTE — Telephone Encounter (Signed)
Thanks Kim. Could you please call and let patient know doppler is negative?

## 2022-10-31 ENCOUNTER — Encounter (HOSPITAL_COMMUNITY): Payer: Self-pay | Admitting: Gynecologic Oncology

## 2022-11-01 ENCOUNTER — Telehealth: Payer: Self-pay | Admitting: *Deleted

## 2022-11-01 NOTE — Telephone Encounter (Signed)
Per Dr Berline Lopes moved genetics appts from 2/26 to 1/15. Patient aware

## 2022-11-06 ENCOUNTER — Other Ambulatory Visit: Payer: Medicare PPO

## 2022-11-06 ENCOUNTER — Encounter: Payer: Medicare PPO | Admitting: Genetic Counselor

## 2022-11-12 ENCOUNTER — Inpatient Hospital Stay: Payer: Medicare PPO

## 2022-11-12 ENCOUNTER — Inpatient Hospital Stay: Payer: Medicare PPO | Attending: Gynecologic Oncology | Admitting: Licensed Clinical Social Worker

## 2022-11-12 ENCOUNTER — Encounter: Payer: Self-pay | Admitting: Licensed Clinical Social Worker

## 2022-11-12 ENCOUNTER — Other Ambulatory Visit: Payer: Self-pay | Admitting: Licensed Clinical Social Worker

## 2022-11-12 DIAGNOSIS — Z8042 Family history of malignant neoplasm of prostate: Secondary | ICD-10-CM | POA: Diagnosis not present

## 2022-11-12 DIAGNOSIS — C541 Malignant neoplasm of endometrium: Secondary | ICD-10-CM | POA: Diagnosis not present

## 2022-11-12 NOTE — Progress Notes (Signed)
REFERRING PROVIDER: Lafonda Mosses, MD [place],  [place] [address]  PRIMARY PROVIDER:  Lujean Amel, MD  PRIMARY REASON FOR VISIT:  1. Endometrial cancer ([place])   2. Family history of prostate cancer      HISTORY OF PRESENT ILLNESS:   Morgan Frye, a 72 y.o. female, was seen for a [facility] cancer genetics consultation at the request of Dr. Berline Lopes due to a personal and family history of cancer.  Morgan Frye presents to clinic today to discuss the possibility of a hereditary predisposition to cancer, genetic testing, and to further clarify her future cancer risks, as well as potential cancer risks for family members.   CANCER HISTORY:  In 2023, at the age of 42, Morgan Frye was diagnosed with endometrial cancer. This was treated with TAH-BSO. 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 No evidence of metastatic disease in the abdomen or pelvis. No obvious endometrial or cervical mass demonstrated. 2. 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 3. Cholelithiasis without evidence of cholecystitis. 4. Nonobstructing bilateral renal calculi. 5.  Aortic Atherosclerosis (ICD10-I70.   10/03/2022 Surgery   Robotic-assisted laparoscopic total hysterectomy with bilateral salpingoophorectomy, SLN biopsy bilaterally    Findings:  On EUA, 8 cm mobile uterus. Small cervix flush with the vaginal apex. On intra-abdominal entry, normal upper abdominal survey. Normal small and large bowel. Multiple fecaliths within the pelvis (removed). Uterus 8cm and normal in appearance although posterior  serosa tore very easily with any sort of manipulation concerning for possible deeper myometrial invasion. Bilateral ovaries and tubes normal in appearance. SLN mapping successful to two pelvic lymph nodes bilaterally. Some fibrosis of the retroperitoneum, more on the left than right. Dense adhesions between the bladder and LUS/cervix.     10/03/2022 Pathology Results   A. LYMPH NODE, SENTINEL, RIGHT OBTURATOR, BIOPSY: - Negative for carcinoma (0/1)  B. LYMPH NODE, SENTINEL, RIGHT EXTERNAL ILIAC, BIOPSY: - Negative for carcinoma (0/1)  C. LYMPH NODE, SENTINEL, LEFT OBTURATOR, BIOPSY: - Negative for carcinoma (0/1)  D. LYMPH NODE, SENTINEL, LEFT OBTURATOR AND ADJACENT NON GREEN LYMPH NODES: - Negative for carcinoma (0/1)  E. UTERUS, CERVIX, BILATERAL FALLOPIAN TUBES AND OVARIES: - Endometrioid carcinoma, FIGO grade 1, pT1a - Myometrial invasion less than 50% - No lymphovascular invasion identified - Cervix: Benign, nabothian cyst - Bilateral fallopian tubes: Benign, paratubal cysts - Bilateral ovaries: No significant pathologic changes - See oncology table  ONCOLOGY TABLE:   UTERUS, CARCINOMA OR CARCINOSARCOMA: Resection  Procedure: Total hysterectomy and bilateral salpingo-oophorectomy Histologic Type: Endometrioid adenocarcinoma Histologic Grade: FIGO grade 1 Myometrial Invasion:      Depth of Myometrial Invasion (mm): 3 mm      Myometrial Thickness (mm): 15 mm      Percentage of Myometrial Invasion: 20% Uterine Serosa Involvement: Not identified Cervical stromal Involvement: Not identified Extent of involvement of other tissue/organs: Not applicable Peritoneal/Ascitic Fluid: Not submitted Lymphovascular Invasion: Not identified Regional Lymph Nodes:      Pelvic Lymph Nodes Examined: 4 Sentinel  0 non-sentinel                                  4: total      Pelvic Lymph Nodes with Metastasis: 0 Distant Metastasis:      Distant Site(s)  Involved: Not applicable Pathologic Stage Classification (pTNM, AJCC 8th Edition): pT1a, pN0 Ancillary Studies: MMR / MSI testing will be ordered Representative Tumor Block: E8 Comment(s): Appropriately controlled p53 immunohistochemical stain shows wild-type staining - Pancytokeratin stain was performed on the lymph nodes and is negative.      RISK FACTORS:  Menarche was at age 11.  First live birth at age 40.  Ovaries intact: no.  Hysterectomy: yes.  Menopausal status: postmenopausal.  HRT use: 7 years. Colonoscopy: yes;  few polyps on cscope at 89, has had Cologuard since . Mammogram within the last year: yes. Number of breast biopsies: 0.   Past Medical History:  Diagnosis Date   Arthritis    osteoarthritis- knees, hips, rt. hip bursitis   Diabetes mellitus type 2 in obese (HCC)    Hemangioma of liver    very small- evaluated and considered stable- no further problems or follow up in many years   History of kidney stones    x1 lithotripsy-has others not a bother at this time   History of palpitations    during menopause, on metoprolol. Saw cardiologist   Hypertension    Obesity (BMI 30-39.9)    Transfusion history    '1981- s/p childbirth    Past Surgical History:  Procedure Laterality Date   CATARACT EXTRACTION, BILATERAL Bilateral    5 yrs ago   [place] OF UTERUS  2010   perimenopause, caused bleeding, had polyp removed   LITHOTRIPSY     ROBOTIC ASSISTED TOTAL HYSTERECTOMY WITH BILATERAL SALPINGO OOPHERECTOMY N/A 10/03/2022   Procedure: XI ROBOTIC ASSISTED TOTAL HYSTERECTOMY WITH BILATERAL SALPINGO OOPHORECTOMY;  Surgeon: Lafonda Mosses, MD;  Location: [facility];  Service: Gynecology;  Laterality: N/A;   SENTINEL NODE BIOPSY N/A 10/03/2022   Procedure: SENTINEL NODE BIOPSY;  Surgeon: Lafonda Mosses, MD;  Location: [facility];  Service: Gynecology;  Laterality: N/A;   TONSILLECTOMY     child   TOTAL KNEE ARTHROPLASTY Right 05/24/2014    Procedure: RIGHT TOTAL KNEE ARTHROPLASTY;  Surgeon: Gearlean Alf, MD;  Location: [facility];  Service: Orthopedics;  Laterality: Right;   TOTAL KNEE ARTHROPLASTY Left 05/30/2015   Procedure: LEFT TOTAL KNEE ARTHROPLASTY;  Surgeon: Gaynelle Arabian, MD;  [facility];  Service: Orthopedics;  Laterality: Left;    FAMILY HISTORY:  We obtained a detailed, 4-generation family history.  Significant diagnoses are listed below: Family History  Problem Relation Age of Onset   Liver disease Mother    Heart disease Father        ICD.  Father is 73   Prostate cancer Father    Colon cancer Neg Hx    Breast cancer Neg Hx    Ovarian cancer Neg Hx    Endometrial cancer Neg Hx    Pancreatic cancer Neg Hx    Morgan Frye had 2 sons, one passed at 61, one is living at 29 and has diverticulitis (has had colonoscopy). Patient has 1 brother, 59, no cancer.   Morgan Frye mother died at 17 and did not have cancer. No known cancers on this side of the family aside from skin cancer in her aunt (  basal cell).  Morgan Frye father had prostate cancer at 49 and died at 61. No other known cancers on this side of the family.  Morgan Frye is unaware of previous family history of genetic testing for hereditary cancer risks. There is no reported Ashkenazi Jewish ancestry. There is no known consanguinity.    GENETIC COUNSELING ASSESSMENT: Morgan Frye is a 72 y.o. female with a personal history of endometrial cancer with loss of MLH1/PMS2 which is somewhat suggestive of a hereditary cancer syndrome and predisposition to cancer. We, therefore, discussed and recommended the following at today's visit.   DISCUSSION: We discussed that approximately 2-5% of endometrial cancer is hereditary. Most cases of hereditary endometrial cancer are associated with Lynch syndrome genes. Her tumor testing is suggestive of possible Lynch syndrome, although there are other genes associated with hereditary cancer as well. Cancers and risks are gene  specific. We discussed that testing is beneficial for several reasons including knowing about cancer risks, identifying potential screening and risk-reduction options that may be appropriate, and to understand if other family members could be at risk for cancer and allow them to undergo genetic testing.   We reviewed the characteristics, features and inheritance patterns of hereditary cancer syndromes. We also discussed genetic testing, including the appropriate family members to test, the process of testing, insurance coverage and turn-around-time for results. We discussed the implications of a negative, positive and/or variant of uncertain significant result. We recommended Morgan Frye pursue genetic testing for the [facility] Common Hereditary Cancers+RNA gene panel.   Based on Morgan Frye's personal history of cancer, she meets medical criteria for genetic testing. Despite that she meets criteria, she may still have an out of pocket cost. We discussed that if her out of pocket cost for testing is over $100, the laboratory will call and confirm whether she wants to proceed with testing.  If the out of pocket cost of testing is less than $100 she will be billed by the genetic testing laboratory.   PLAN: After considering the risks, benefits, and limitations, Morgan Frye provided informed consent to pursue genetic testing and the blood sample was sent to [facility] for analysis of the Common Hereditary Cancers+RNA panel. Results should be available within approximately 2-3 weeks' time, at which point they will be disclosed by telephone to Morgan Frye, as will any additional recommendations warranted by these results. Morgan Frye will receive a summary of her genetic counseling visit and a copy of her results once available. This information will also be available in Epic.   Morgan Frye questions were answered to her satisfaction today. Our contact information was provided should additional questions or  concerns arise. Thank you for the referral and allowing [place] to share in the care of your patient.   Faith Rogue, MS, [facility] Genetic Counselor [email or web address] Phone: [phone]  The patient was seen for a total of 25 minutes in face-to-face genetic counseling.  Dr. Grayland Ormond was available for discussion regarding this case.   _______________________________________________________________________ For Office Staff:  Number of people involved in session: 1 Was an Intern/ student involved with case: no

## 2022-11-16 ENCOUNTER — Telehealth: Payer: Self-pay | Admitting: Gynecologic Oncology

## 2022-11-16 ENCOUNTER — Telehealth: Payer: Self-pay | Admitting: *Deleted

## 2022-11-16 NOTE — Telephone Encounter (Signed)
Patient called and stated "I had surgery with Dr Berline Lopes on 12/6. About 4 days ago I started having some heavy spotting. I am wearing a light pad and changing it only twice a day. I have no pain, vaginal odor, or discharge. I have not done any heavy lifting and nothing in the vagina. The color of the blood is dark red."   Per Dr Berline Lopes the patient is to take is easy this week and no lifting, just rest. Patient ask to call the office on Monday for an update. Patient verbalized

## 2022-11-16 NOTE — Telephone Encounter (Signed)
Called patient.  She note several days of some vaginal spotting.  Describes what is looking darker red when it is in the toilet or on pad.  She is changing her pad a couple of times a day, and often there is very little if any blood on the pad.  When there is, it seems to be darker red in color but still not very much.  She denies any other symptoms including pain.  Reports regular bowel bladder function.  Feeling very well.  Discussed possibly having some drainage of her pelvic hematoma through the cuff.  Will plan to see her on Monday for an exam.  Reviewed precautions over the weekend.  Jeral Pinch MD Gynecologic Oncology

## 2022-11-19 ENCOUNTER — Inpatient Hospital Stay (HOSPITAL_BASED_OUTPATIENT_CLINIC_OR_DEPARTMENT_OTHER): Payer: Medicare PPO | Admitting: Gynecologic Oncology

## 2022-11-19 ENCOUNTER — Other Ambulatory Visit: Payer: Self-pay

## 2022-11-19 ENCOUNTER — Encounter: Payer: Self-pay | Admitting: Gynecologic Oncology

## 2022-11-19 VITALS — BP 140/69 | HR 80 | Temp 97.6°F | Resp 14 | Wt 234.0 lb

## 2022-11-19 DIAGNOSIS — N952 Postmenopausal atrophic vaginitis: Secondary | ICD-10-CM

## 2022-11-19 DIAGNOSIS — Z90710 Acquired absence of both cervix and uterus: Secondary | ICD-10-CM

## 2022-11-19 DIAGNOSIS — Z90722 Acquired absence of ovaries, bilateral: Secondary | ICD-10-CM

## 2022-11-19 DIAGNOSIS — N939 Abnormal uterine and vaginal bleeding, unspecified: Secondary | ICD-10-CM

## 2022-11-19 DIAGNOSIS — C541 Malignant neoplasm of endometrium: Secondary | ICD-10-CM

## 2022-11-19 MED ORDER — ESTROGENS CONJUGATED 0.625 MG/GM VA CREA: 1.0000 | TOPICAL_CREAM | VAGINAL | 1 refills | Status: DC

## 2022-11-19 NOTE — Progress Notes (Signed)
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 No evidence of metastatic disease in the abdomen or pelvis. No obvious endometrial or cervical mass demonstrated. 2. 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 3. Cholelithiasis without evidence of cholecystitis. 4. Nonobstructing bilateral renal calculi. 5.  Aortic Atherosclerosis (ICD10-I70.   10/03/2022 Surgery   Robotic-assisted laparoscopic total hysterectomy with bilateral salpingoophorectomy, SLN biopsy bilaterally    Findings:  On EUA, 8 cm mobile uterus. Small cervix flush with the vaginal apex. On intra-abdominal entry, normal upper abdominal survey. Normal small and large bowel. Multiple fecaliths within the pelvis (removed). Uterus 8cm and normal in appearance although posterior serosa tore very easily with any sort of manipulation concerning for possible deeper myometrial invasion. Bilateral ovaries and tubes normal in appearance. SLN mapping successful to two pelvic lymph nodes bilaterally. Some fibrosis of the retroperitoneum, more on the left than right. Dense adhesions between the bladder and LUS/cervix.     10/03/2022 Pathology Results   A. LYMPH NODE, SENTINEL, RIGHT OBTURATOR, BIOPSY: - Negative for carcinoma (0/1)  B. LYMPH NODE, SENTINEL, RIGHT EXTERNAL ILIAC, BIOPSY: - Negative for carcinoma (0/1)  C. LYMPH NODE, SENTINEL, LEFT OBTURATOR, BIOPSY: - Negative for carcinoma (0/1)  D. LYMPH NODE, SENTINEL, LEFT OBTURATOR AND ADJACENT NON GREEN LYMPH NODES: - Negative for carcinoma (0/1)  E. UTERUS, CERVIX,  BILATERAL FALLOPIAN TUBES AND OVARIES: - Endometrioid carcinoma, FIGO grade 1, pT1a - Myometrial invasion less than 50% - No lymphovascular invasion identified - Cervix: Benign, nabothian cyst - Bilateral fallopian tubes: Benign, paratubal cysts - Bilateral ovaries: No significant pathologic changes - See oncology table  ONCOLOGY TABLE:   UTERUS, CARCINOMA OR CARCINOSARCOMA: Resection  Procedure: Total hysterectomy and bilateral salpingo-oophorectomy Histologic Type: Endometrioid adenocarcinoma Histologic Grade: FIGO grade 1 Myometrial Invasion:      Depth of Myometrial Invasion (mm): 3 mm      Myometrial Thickness (mm): 15 mm      Percentage of Myometrial Invasion: 20% Uterine Serosa Involvement: Not identified Cervical stromal Involvement: Not identified Extent of involvement of other tissue/organs: Not applicable Peritoneal/Ascitic Fluid: Not submitted Lymphovascular Invasion: Not identified Regional Lymph Nodes:      Pelvic Lymph Nodes Examined: 4 Sentinel                               0 non-sentinel                                  4: total      Pelvic Lymph Nodes with Metastasis: 0 Distant Metastasis:      Distant Site(s) Involved: Not applicable Pathologic Stage Classification (pTNM, AJCC 8th Edition): pT1a, pN0 Ancillary Studies: MMR / MSI testing will be ordered Representative Tumor Block: E8 Comment(s): Appropriately controlled p53 immunohistochemical stain shows wild-type staining - Pancytokeratin stain was performed on the lymph nodes and is negative.      Interval History: Patient called on Friday with several days of vaginal spotting, noting darker red blood in the toilet or on the pad.  Since I spoke with her on Friday, she notes decreased bleeding/spotting.  Quantity was less yesterday, decreased again overnight.  She had minimal blood on the pad this morning.  Now notes that spotting on the pad or in the toilet is pink.  She continues to deny any pain or  cramping.  Reports baseline bowel bladder function.  Past Medical/Surgical History: Past Medical History:  Diagnosis Date   Arthritis    osteoarthritis- knees, hips, rt. hip bursitis   Diabetes mellitus type 2 in obese (HCC)    Hemangioma of liver    very small- evaluated and considered stable- no further problems or follow up in many years   History of kidney stones    x1 lithotripsy-has others not a bother at this time   History of palpitations    during menopause, on metoprolol. Saw cardiologist   Hypertension    Obesity (BMI 30-39.9)    Transfusion history    '1981- s/p childbirth    Past Surgical History:  Procedure Laterality Date   CATARACT EXTRACTION, BILATERAL Bilateral    5 yrs ago   [place] OF UTERUS  2010   perimenopause, caused bleeding, had polyp removed   LITHOTRIPSY     ROBOTIC ASSISTED TOTAL HYSTERECTOMY WITH BILATERAL SALPINGO OOPHERECTOMY N/A 10/03/2022   Procedure: XI ROBOTIC ASSISTED TOTAL HYSTERECTOMY WITH BILATERAL SALPINGO OOPHORECTOMY;  Surgeon: Lafonda Mosses, MD;  [facility]: [facility];  Service: Gynecology;  Laterality: N/A;   SENTINEL NODE BIOPSY N/A 10/03/2022   Procedure: SENTINEL NODE BIOPSY;  Surgeon: Lafonda Mosses, MD;  Location: [facility];  Service: Gynecology;  Laterality: N/A;   TONSILLECTOMY     child   TOTAL KNEE ARTHROPLASTY Right 05/24/2014   Procedure: RIGHT TOTAL KNEE ARTHROPLASTY;  Surgeon: Gearlean Alf, MD;  Location: [facility];  Service: Orthopedics;  Laterality: Right;   TOTAL KNEE ARTHROPLASTY Left 05/30/2015   Procedure: LEFT TOTAL KNEE ARTHROPLASTY;  Surgeon: Gaynelle Arabian, MD;  [facility];  Service: Orthopedics;  Laterality: Left;    Family History  Problem Relation Age of Onset   Liver disease Mother    Heart disease Father        ICD.  Father is 27   Prostate cancer Father 39   Colon cancer Neg Hx    Breast cancer Neg Hx    Ovarian cancer Neg Hx    Endometrial cancer Neg Hx    Pancreatic cancer  Neg Hx     Social History   Socioeconomic History   Marital status: Married    Spouse name: Not on file   Number of children: 2   Years of education: Not on file   Highest education level: Not on file  Occupational History   Occupation: retired  Tobacco Use   Smoking status: Never   Smokeless tobacco: Never  Vaping Use   Vaping Use: Never used  Substance and Sexual Activity   Alcohol use: Never   Drug use: No   Sexual activity: Yes  Other Topics Concern   Not on file  Social History Narrative   Lives with husband.     Social Determinants of Health   Financial Resource Strain: Low Risk  (09/13/2022)   Overall Financial Resource Strain (CARDIA)    Difficulty of Paying Living Expenses: Not hard at all  Food Insecurity: No Food Insecurity (09/13/2022)   Hunger Vital Sign    Worried About Running Out of Food in the Last Year: Never true    Ran  Out of Food in the Last Year: Never true  Transportation Needs: No Transportation Needs (09/13/2022)   PRAPARE - Hydrologist (Medical): No    Lack of Transportation (Non-Medical): No  Physical Activity: Not on file  Stress: Not on file  Social Connections: Not on file    Current Medications:  Current Outpatient Medications:    conjugated estrogens (PREMARIN) vaginal cream, Place 1 Applicatorful vaginally 3 (three) times a week., Disp: 42.5 g, Rfl: 1   Accu-Chek FastClix Lancets MISC, Apply topically daily., Disp: , Rfl:    ACCU-CHEK GUIDE test strip, daily., Disp: , Rfl:    aspirin EC 81 MG tablet, Take 1 tablet (81 mg total) by mouth at bedtime. Swallow whole., Disp: 30 tablet, Rfl: 12   atorvastatin (LIPITOR) 20 MG tablet, Take 20 mg by mouth at bedtime., Disp: , Rfl:    Calcium Carb-Cholecalciferol (CALCIUM + VITAMIN D3 PO), Take 1 tablet by mouth every evening., Disp: , Rfl:    cetirizine (ZYRTEC) 10 MG tablet, Take 10 mg by mouth daily., Disp: , Rfl:    Cholecalciferol (VITAMIN D3) 50 MCG (2000  UT) CAPS, Take 2,000 Units by mouth daily., Disp: , Rfl:    ciclopirox (LOPROX) 0.77 % cream, Apply 1 application  topically 2 (two) times daily as needed (yeast under breasts)., Disp: , Rfl:    lisinopril-hydrochlorothiazide (PRINZIDE,ZESTORETIC) 20-12.5 MG per tablet, Take 1 tablet by mouth every morning., Disp: , Rfl:    metFORMIN (GLUCOPHAGE-XR) 500 MG 24 hr tablet, Take 500 mg by mouth every evening., Disp: , Rfl:    metoprolol succinate (TOPROL-XL) 50 MG 24 hr tablet, Take 50 mg by mouth every evening. Take with or immediately following a meal., Disp: , Rfl:    Multiple Vitamin (MULTIVITAMIN) tablet, Take 1 tablet by mouth daily., Disp: , Rfl:    senna-docusate (SENOKOT-S) 8.6-50 MG tablet, Take 2 tablets by mouth at bedtime. For AFTER surgery only, do not take if having diarrrhea, Disp: 30 tablet, Rfl: 0  Review of Systems: + bleeding Denies appetite changes, fevers, chills, fatigue, unexplained weight changes. Denies hearing loss, neck lumps or masses, mouth sores, ringing in ears or voice changes. Denies cough or wheezing.  Denies shortness of breath. Denies chest pain or palpitations. Denies leg swelling. Denies abdominal distention, pain, blood in stools, constipation, diarrhea, nausea, vomiting, or early satiety. Denies pain with intercourse, dysuria, frequency, hematuria or incontinence. Denies hot flashes, pelvic pain or vaginal discharge.   Denies joint pain, back pain or muscle pain/cramps. Denies itching, rash, or wounds. Denies dizziness, headaches, numbness or seizures. Denies swollen lymph nodes or glands, denies easy bruising or bleeding. Denies anxiety, depression, confusion, or decreased concentration.  Physical Exam: BP (!) 140/69 (BP Location: Left Arm, Patient Position: Sitting)   Pulse 80   Temp 97.6 F (36.4 C) (Oral)   Resp 14   Wt 234 lb (106.1 kg)   SpO2 98%   BMI 40.17 kg/m  General: Alert, oriented, no acute distress. HEENT: Normocephalic,  atraumatic, sclera anicteric. Chest: Unlabored breathing on room air. Abdomen: Obese, soft, nontender.  Normoactive bowel sounds.  No masses or hepatosplenomegaly appreciated.  Well-healed incisions. Extremities: Grossly normal range of motion.  Warm, well perfused.  No edema bilaterally. GU: Normal appearing external genitalia without erythema, excoriation, or lesions.  Speculum exam reveals some older appearing blood within the vaginal vault.  The cuff itself is intact, no suture visible.  No obvious source of bleeding.  The majority of the  older appearing blood is along the anterior vagina, just superior to the urethra.  Again, no active bleeding is seen.  On bimanual exam, cuff is intact with palpation, no tenderness or fluctuance.  Laboratory & Radiologic Studies: None new  Assessment & Plan: Morgan Frye is a 72 y.o. 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 MLH1 promoter hypermethylation testing is absent.  Developed vaginal bleeding at the end of last week, overall decreased.  Exam performed today.  Cuff is intact.  No evidence of bleeding from the cuff itself.  Blood is along the anterior vagina although I do not see obvious trauma or cause for bleeding in this area.  The patient had a somewhat traumatic Foley catheter insertion in clinic after surgery.  We discussed that the time interval since that would be unusual for having bleeding now.  I recommended treatment with vaginal estrogen.  Will start this nightly for a couple of weeks and then 3 times a week at night.  Precautions were given to the patient.  20 minutes of total time was spent for this patient encounter, including preparation, face-to-face counseling with the patient and coordination of care, and documentation of the encounter.  Jeral Pinch, MD  Division of Gynecologic Oncology  Department of Obstetrics and Gynecology   [facility]

## 2022-11-19 NOTE — Patient Instructions (Signed)
It was good to see you today.  Please use the vaginal estrogen every evening for the next 2 weeks and then 3 times a week at night for several weeks following that.  Please call if you have any increase in spotting or bleeding or develop other symptoms such as pain/cramping.

## 2022-11-20 DIAGNOSIS — Z1231 Encounter for screening mammogram for malignant neoplasm of breast: Secondary | ICD-10-CM | POA: Diagnosis not present

## 2022-11-22 ENCOUNTER — Telehealth: Payer: Self-pay | Admitting: *Deleted

## 2022-11-22 NOTE — Telephone Encounter (Signed)
Call to patient to recheck vaginal bleeding. Reports overall has decreased since Sunday- still present but lighter than it has been. Reports it is manageable.  Pharmacy had to order Premarin cream so she has only had one dose. Encouraged to avoid lifting, straining or strenous activity.  Call back if any increase in bleeding.

## 2022-11-27 ENCOUNTER — Encounter: Payer: Self-pay | Admitting: Licensed Clinical Social Worker

## 2022-11-27 ENCOUNTER — Telehealth: Payer: Self-pay | Admitting: *Deleted

## 2022-11-27 ENCOUNTER — Telehealth: Payer: Self-pay | Admitting: Licensed Clinical Social Worker

## 2022-11-27 ENCOUNTER — Ambulatory Visit: Payer: Self-pay | Admitting: Licensed Clinical Social Worker

## 2022-11-27 DIAGNOSIS — Z1379 Encounter for other screening for genetic and chromosomal anomalies: Secondary | ICD-10-CM

## 2022-11-27 NOTE — Progress Notes (Signed)
HPI:   Morgan Frye was previously seen in the [facility] due to a personal and family history of cancer and concerns regarding a hereditary predisposition to cancer. Please refer to our prior cancer genetics clinic note for more information regarding our discussion, assessment and recommendations, at the time. Morgan Frye recent genetic test results were disclosed to her, as were recommendations warranted by these results. These results and recommendations are discussed in more detail below.  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 No evidence of metastatic disease in the abdomen or pelvis. No obvious endometrial or cervical mass demonstrated. 2. 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 3. Cholelithiasis without evidence of cholecystitis. 4. Nonobstructing bilateral renal calculi. 5.  Aortic Atherosclerosis (ICD10-I70.   10/03/2022 Surgery   Robotic-assisted laparoscopic total hysterectomy with bilateral salpingoophorectomy, SLN biopsy bilaterally    Findings:  On EUA, 8 cm mobile uterus. Small cervix flush with the vaginal apex. On intra-abdominal entry, normal upper abdominal survey. Normal small and large bowel. Multiple fecaliths within the pelvis (removed). Uterus 8cm and normal in appearance although posterior serosa tore very easily with any sort of manipulation concerning for possible deeper myometrial invasion. Bilateral ovaries and tubes normal in appearance. SLN mapping successful to two pelvic lymph nodes bilaterally. Some fibrosis of the retroperitoneum, more on the left than right. Dense adhesions between the bladder and LUS/cervix.      10/03/2022 Pathology Results   A. LYMPH NODE, SENTINEL, RIGHT OBTURATOR, BIOPSY: - Negative for carcinoma (0/1)  B. LYMPH NODE, SENTINEL, RIGHT EXTERNAL ILIAC, BIOPSY: - Negative for carcinoma (0/1)  C. LYMPH NODE, SENTINEL, LEFT OBTURATOR, BIOPSY: - Negative for carcinoma (0/1)  D. LYMPH NODE, SENTINEL, LEFT OBTURATOR AND ADJACENT NON GREEN LYMPH NODES: - Negative for carcinoma (0/1)  E. UTERUS, CERVIX, BILATERAL FALLOPIAN TUBES AND OVARIES: - Endometrioid carcinoma, FIGO grade 1, pT1a - Myometrial invasion less than 50% - No lymphovascular invasion identified - Cervix: Benign, nabothian cyst - Bilateral fallopian tubes: Benign, paratubal cysts - Bilateral ovaries: No significant pathologic changes - See oncology table  ONCOLOGY TABLE:   UTERUS, CARCINOMA OR CARCINOSARCOMA: Resection  Procedure: Total hysterectomy and bilateral salpingo-oophorectomy Histologic Type: Endometrioid adenocarcinoma Histologic Grade: FIGO grade 1 Myometrial Invasion:      Depth of Myometrial Invasion (mm): 3 mm      Myometrial Thickness (mm): 15 mm      Percentage of Myometrial Invasion: 20% Uterine Serosa Involvement: Not identified Cervical stromal Involvement: Not identified Extent of involvement of other tissue/organs: Not applicable Peritoneal/Ascitic Fluid: Not submitted Lymphovascular Invasion: Not identified Regional Lymph Nodes:      Pelvic Lymph Nodes Examined: 4 Sentinel                               0 non-sentinel                                  4: total      Pelvic Lymph Nodes with Metastasis: 0 Distant Metastasis:      Distant Site(s) Involved: Not applicable Pathologic Stage Classification (  pTNM, AJCC 8th Edition): pT1a, pN0 Ancillary Studies: MMR / MSI testing will be ordered Representative Tumor Block: E8 Comment(s): Appropriately controlled p53 immunohistochemical stain shows wild-type staining - Pancytokeratin stain was performed on the lymph nodes and is  negative.     Genetic Testing   Negative genetic testing. No pathogenic variants identified on the [facility] Common Hereditary Cancers+RNA panel. The report date is 11/23/2022.  The Common Hereditary Cancers Panel + RNA offered by [facility] includes sequencing and/or deletion duplication testing of the following 48 genes: APC*, ATM*, AXIN2, BAP1, BARD1, BMPR1A, BRCA1, BRCA2, BRIP1, CDH1, CDK4, CDKN2A (p14ARF), CDKN2A (p16INK4a), CHEK2, CTNNA1, DICER1*, EPCAM*, FH*, GREM1*, HOXB13, KIT, MBD4, MEN1*, MLH1*, MSH2*, MSH3*, MSH6*, MUTYH, NF1*, NTHL1, PALB2, PDGFRA, PMS2*, POLD1*, POLE, PTEN*, RAD51C, RAD51D, SDHA*, SDHB, SDHC*, SDHD, SMAD4, SMARCA4, STK11, TP53, TSC1*, TSC2, VHL.      FAMILY HISTORY:  We obtained a detailed, 4-generation family history.  Significant diagnoses are listed below: Family History  Problem Relation Age of Onset   Liver disease Mother    Heart disease Father        ICD.  Father is 77   Prostate cancer Father 70   Colon cancer Neg Hx    Breast cancer Neg Hx    Ovarian cancer Neg Hx    Endometrial cancer Neg Hx    Pancreatic cancer Neg Hx    Morgan Frye had 2 sons, one passed at 66, one is living at 56 and has diverticulitis (has had colonoscopy). Patient has 1 brother, 75, no cancer.    Morgan Frye mother died at 59 and did not have cancer. No known cancers on this side of the family aside from skin cancer in her aunt (basal cell).   Morgan Frye father had prostate cancer at 67 and died at 61. No other known cancers on this side of the family.   Morgan Frye is unaware of previous family history of genetic testing for hereditary cancer risks. There is no reported Ashkenazi Jewish ancestry. There is no known consanguinity.      GENETIC TEST RESULTS:  The [facility] Common Hereditary Cancers+RNA Panel found no pathogenic mutations.   The Common Hereditary Cancers Panel + RNA offered by [facility] includes sequencing and/or deletion duplication testing of the following 48  genes: APC*, ATM*, AXIN2, BAP1, BARD1, BMPR1A, BRCA1, BRCA2, BRIP1, CDH1, CDK4, CDKN2A (p14ARF), CDKN2A (p16INK4a), CHEK2, CTNNA1, DICER1*, EPCAM*, FH*, GREM1*, HOXB13, KIT, MBD4, MEN1*, MLH1*, MSH2*, MSH3*, MSH6*, MUTYH, NF1*, NTHL1, PALB2, PDGFRA, PMS2*, POLD1*, POLE, PTEN*, RAD51C, RAD51D, SDHA*, SDHB, SDHC*, SDHD, SMAD4, SMARCA4, STK11, TP53, TSC1*, TSC2, VHL.  The test report has been scanned into EPIC and is located under the Molecular Pathology section of the Results Review tab.  A portion of the result report is included below for reference. Genetic testing reported out on 11/23/2022.      Even though a pathogenic variant was not identified, possible explanations for the cancer in the family may include: There may be no hereditary risk for cancer in the family. The cancers in Morgan Frye and/or her family may be sporadic/familial or due to other genetic and environmental factors. There may be a gene mutation in one of these genes that current testing methods cannot detect but that chance is small. There could be another gene that has not yet been discovered, or that we have not yet tested, that is responsible for the cancer diagnoses in the family.  It is also possible there is a hereditary cause for the cancer in the  family that Morgan Frye did not inherit.  Therefore, it is important to remain in touch with cancer genetics in the future so that we can continue to offer Morgan Frye the most up to date genetic testing.   ADDITIONAL GENETIC TESTING:  We discussed with Morgan Frye that her genetic testing was fairly extensive.  If there are additional relevant genes identified to increase cancer risk that can be analyzed in the future, we would be happy to discuss and coordinate this testing at that time.    CANCER SCREENING RECOMMENDATIONS:  Morgan Frye test result is considered negative (normal).  This means that we have not identified a hereditary cause for her personal and family history of  cancer at this time.   An individual's cancer risk and medical management are not determined by genetic test results alone. Overall cancer risk assessment incorporates additional factors, including personal medical history, family history, and any available genetic information that may result in a personalized plan for cancer prevention and surveillance. Therefore, it is recommended she continue to follow the cancer management and screening guidelines provided by her oncology and primary healthcare provider.  RECOMMENDATIONS FOR FAMILY MEMBERS:   Since she did not inherit a identifiable mutation in a cancer predisposition gene included on this panel, her children could not have inherited a known mutation from her in one of these genes. Individuals in this family might be at some increased risk of developing cancer, over the general population risk, due to the family history of cancer.  Individuals in the family should notify their providers of the family history of cancer. We recommend women in this family have a yearly mammogram beginning at age 13, or 61 years younger than the earliest onset of cancer, an annual clinical breast exam, and perform monthly breast self-exams.  Family members should have colonoscopies by at age 44, or earlier, as recommended by their providers.   FOLLOW-UP:  Lastly, we discussed with Morgan Frye that cancer genetics is a rapidly advancing field and it is possible that new genetic tests will be appropriate for her and/or her family members in the future. We encouraged her to remain in contact with cancer genetics on an annual basis so we can update her personal and family histories and let her know of advances in cancer genetics that may benefit this family.   Our contact number was provided. Morgan Frye questions were answered to her satisfaction, and she knows she is welcome to call us at anytime with additional questions or concerns.    Faith Rogue, MS, [facility] Genetic  [email or web address] [email or web address] Phone: [phone]

## 2022-11-27 NOTE — Telephone Encounter (Signed)
I contacted Ms. Mirabile to discuss her genetic testing results. No pathogenic variants were identified in the 48 genes analyzed. Detailed clinic note to follow.   The test report has been scanned into EPIC and is located under the Molecular Pathology section of the Results Review tab.  A portion of the result report is included below for reference.      Faith Rogue, MS, [facility] Genetic Counselor [email or web address] Phone: [phone]

## 2022-11-27 NOTE — Telephone Encounter (Signed)
Call to patient for status update on bleeding. Reports that she has used cream for four nights and that overall bleeding is decreasing.  Pad changed twice per day. Pad is not soaked, changed for hygiene. Denies any pain. Edema in left leg has resolved.   Per Dr Berline Lopes, would like follow-up in office ibn 4-6 weeks. Appointment scheduled on 01-10-23 at 3 pm with [name], NP when Dr Berline Lopes is in office.

## 2022-12-03 ENCOUNTER — Telehealth: Payer: Self-pay | Admitting: Surgery

## 2022-12-03 NOTE — Telephone Encounter (Signed)
Patient called in stating she is having bursitis symptoms in her hip and has plans to call her doctor about it, but prior to doing so she wanted to make sure there wasn't any medications she needs to avoid still. Advised patient (per [name], APP) that since she is 2 months post op she does not have any restrictions. Patient had no other concerns at this time and stated that she has not had any further vaginal bleeding for the past 3 days since starting the premarin.

## 2022-12-06 DIAGNOSIS — M25552 Pain in left hip: Secondary | ICD-10-CM | POA: Diagnosis not present

## 2022-12-12 DIAGNOSIS — D1803 Hemangioma of intra-abdominal structures: Secondary | ICD-10-CM | POA: Diagnosis not present

## 2022-12-12 DIAGNOSIS — Z0001 Encounter for general adult medical examination with abnormal findings: Secondary | ICD-10-CM | POA: Diagnosis not present

## 2022-12-12 DIAGNOSIS — Z79899 Other long term (current) drug therapy: Secondary | ICD-10-CM | POA: Diagnosis not present

## 2022-12-12 DIAGNOSIS — K7460 Unspecified cirrhosis of liver: Secondary | ICD-10-CM | POA: Diagnosis not present

## 2022-12-12 DIAGNOSIS — I700 Atherosclerosis of aorta: Secondary | ICD-10-CM | POA: Diagnosis not present

## 2022-12-12 DIAGNOSIS — E1169 Type 2 diabetes mellitus with other specified complication: Secondary | ICD-10-CM | POA: Diagnosis not present

## 2022-12-12 DIAGNOSIS — E1165 Type 2 diabetes mellitus with hyperglycemia: Secondary | ICD-10-CM | POA: Diagnosis not present

## 2022-12-12 DIAGNOSIS — K760 Fatty (change of) liver, not elsewhere classified: Secondary | ICD-10-CM | POA: Diagnosis not present

## 2022-12-12 DIAGNOSIS — I10 Essential (primary) hypertension: Secondary | ICD-10-CM | POA: Diagnosis not present

## 2022-12-15 ENCOUNTER — Other Ambulatory Visit: Payer: Self-pay | Admitting: Radiology

## 2022-12-20 ENCOUNTER — Encounter: Payer: Self-pay | Admitting: Gynecologic Oncology

## 2022-12-24 ENCOUNTER — Encounter: Payer: Medicare PPO | Admitting: Genetic Counselor

## 2022-12-24 ENCOUNTER — Other Ambulatory Visit: Payer: Medicare PPO

## 2022-12-27 ENCOUNTER — Telehealth: Payer: Self-pay | Admitting: *Deleted

## 2022-12-27 NOTE — Telephone Encounter (Signed)
Per patient request, fax records to Dr Collene Mares office at [facility]

## 2023-01-01 ENCOUNTER — Encounter: Payer: Self-pay | Admitting: Gynecologic Oncology

## 2023-01-03 ENCOUNTER — Telehealth: Payer: Self-pay

## 2023-01-03 NOTE — Telephone Encounter (Signed)
I called Morgan Frye today to move her appointment from 3/14 to 3/12 with [facility].  She states she has been using the Premarin vaginal cream and is no longer bleeding. She is asking if she still needs to be seen?   Per [name] NP, as long as she is not having bleeding she can stop the cream but if she is sexually active then she needs to keep appointment otherwise if she is not sexually active she does not have to come in next week.  Pt is aware and states she is not sexually active but will call if she resumes.   Appointment on 3/14 cancelled

## 2023-01-08 ENCOUNTER — Other Ambulatory Visit (HOSPITAL_COMMUNITY): Payer: Self-pay | Admitting: Gastroenterology

## 2023-01-08 DIAGNOSIS — K7460 Unspecified cirrhosis of liver: Secondary | ICD-10-CM | POA: Diagnosis not present

## 2023-01-08 DIAGNOSIS — E782 Mixed hyperlipidemia: Secondary | ICD-10-CM | POA: Diagnosis not present

## 2023-01-08 DIAGNOSIS — I10 Essential (primary) hypertension: Secondary | ICD-10-CM | POA: Diagnosis not present

## 2023-01-08 DIAGNOSIS — K760 Fatty (change of) liver, not elsewhere classified: Secondary | ICD-10-CM | POA: Diagnosis not present

## 2023-01-10 ENCOUNTER — Inpatient Hospital Stay: Payer: Medicare PPO | Admitting: Gynecologic Oncology

## 2023-01-16 ENCOUNTER — Ambulatory Visit (HOSPITAL_COMMUNITY)
Admission: RE | Admit: 2023-01-16 | Discharge: 2023-01-16 | Disposition: A | Discharge status: Home / Self Care | Payer: Medicare PPO | Source: Ambulatory Visit | Attending: Gastroenterology | Admitting: Gastroenterology

## 2023-01-16 DIAGNOSIS — K760 Fatty (change of) liver, not elsewhere classified: Secondary | ICD-10-CM | POA: Diagnosis not present

## 2023-01-16 DIAGNOSIS — K7460 Unspecified cirrhosis of liver: Secondary | ICD-10-CM | POA: Diagnosis not present

## 2023-02-13 DIAGNOSIS — H5203 Hypermetropia, bilateral: Secondary | ICD-10-CM | POA: Diagnosis not present

## 2023-02-13 DIAGNOSIS — Z961 Presence of intraocular lens: Secondary | ICD-10-CM | POA: Diagnosis not present

## 2023-02-13 DIAGNOSIS — E119 Type 2 diabetes mellitus without complications: Secondary | ICD-10-CM | POA: Diagnosis not present

## 2023-02-20 DIAGNOSIS — D1803 Hemangioma of intra-abdominal structures: Secondary | ICD-10-CM | POA: Diagnosis not present

## 2023-02-20 DIAGNOSIS — K760 Fatty (change of) liver, not elsewhere classified: Secondary | ICD-10-CM | POA: Diagnosis not present

## 2023-02-20 DIAGNOSIS — R791 Abnormal coagulation profile: Secondary | ICD-10-CM | POA: Diagnosis not present

## 2023-04-25 ENCOUNTER — Encounter: Payer: Self-pay | Admitting: Gynecologic Oncology

## 2023-04-26 ENCOUNTER — Other Ambulatory Visit: Payer: Self-pay

## 2023-04-26 ENCOUNTER — Inpatient Hospital Stay: Payer: Medicare PPO | Attending: Gynecologic Oncology | Admitting: Gynecologic Oncology

## 2023-04-26 ENCOUNTER — Encounter: Payer: Self-pay | Admitting: Gynecologic Oncology

## 2023-04-26 VITALS — BP 130/83 | HR 75 | Temp 97.5°F | Ht 64.96 in | Wt 228.0 lb

## 2023-04-26 DIAGNOSIS — Z8541 Personal history of malignant neoplasm of cervix uteri: Secondary | ICD-10-CM | POA: Diagnosis not present

## 2023-04-26 DIAGNOSIS — Z08 Encounter for follow-up examination after completed treatment for malignant neoplasm: Secondary | ICD-10-CM

## 2023-04-26 DIAGNOSIS — Z90722 Acquired absence of ovaries, bilateral: Secondary | ICD-10-CM | POA: Diagnosis not present

## 2023-04-26 DIAGNOSIS — C541 Malignant neoplasm of endometrium: Secondary | ICD-10-CM

## 2023-04-26 DIAGNOSIS — Z90710 Acquired absence of both cervix and uterus: Secondary | ICD-10-CM | POA: Diagnosis not present

## 2023-04-26 NOTE — Patient Instructions (Signed)
It was good to see you today.  I do not see or feel any evidence of cancer recurrence on your exam.  Please call your OB/GYN to schedule an appointment in 6 months.  Please call the office sometime after the new year to schedule visit to see me in June 2025.  As always, if you develop any new and concerning symptoms before your next visit, please call to see me sooner.

## 2023-04-26 NOTE — Progress Notes (Signed)
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 No evidence of metastatic disease in the abdomen or pelvis. No obvious endometrial or cervical mass demonstrated. 2. 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 3. Cholelithiasis without evidence of cholecystitis. 4. Nonobstructing bilateral renal calculi. 5.  Aortic Atherosclerosis (ICD10-I70.   10/03/2022 Surgery   Robotic-assisted laparoscopic total hysterectomy with bilateral salpingoophorectomy, SLN biopsy bilaterally    Findings:  On EUA, 8 cm mobile uterus. Small cervix flush with the vaginal apex. On intra-abdominal entry, normal upper abdominal survey. Normal small and large bowel. Multiple fecaliths within the pelvis (removed). Uterus 8cm and normal in appearance although posterior serosa tore very easily with any sort of manipulation concerning for possible deeper myometrial invasion. Bilateral ovaries and tubes normal in appearance. SLN mapping successful to two pelvic lymph nodes bilaterally. Some fibrosis of the retroperitoneum, more on the left than right. Dense adhesions between the bladder and LUS/cervix.     10/03/2022 Pathology Results   A. LYMPH NODE, SENTINEL, RIGHT OBTURATOR, BIOPSY: - Negative for carcinoma (0/1)  B. LYMPH NODE, SENTINEL, RIGHT EXTERNAL ILIAC, BIOPSY: - Negative for carcinoma (0/1)  C. LYMPH NODE, SENTINEL, LEFT OBTURATOR, BIOPSY: - Negative for carcinoma (0/1)  D. LYMPH NODE, SENTINEL, LEFT OBTURATOR AND ADJACENT NON GREEN LYMPH NODES: - Negative for carcinoma (0/1)  E. UTERUS, CERVIX,  BILATERAL FALLOPIAN TUBES AND OVARIES: - Endometrioid carcinoma, FIGO grade 1, pT1a - Myometrial invasion less than 50% - No lymphovascular invasion identified - Cervix: Benign, nabothian cyst - Bilateral fallopian tubes: Benign, paratubal cysts - Bilateral ovaries: No significant pathologic changes - See oncology table  ONCOLOGY TABLE:   UTERUS, CARCINOMA OR CARCINOSARCOMA: Resection  Procedure: Total hysterectomy and bilateral salpingo-oophorectomy Histologic Type: Endometrioid adenocarcinoma Histologic Grade: FIGO grade 1 Myometrial Invasion:      Depth of Myometrial Invasion (mm): 3 mm      Myometrial Thickness (mm): 15 mm      Percentage of Myometrial Invasion: 20% Uterine Serosa Involvement: Not identified Cervical stromal Involvement: Not identified Extent of involvement of other tissue/organs: Not applicable Peritoneal/Ascitic Fluid: Not submitted Lymphovascular Invasion: Not identified Regional Lymph Nodes:      Pelvic Lymph Nodes Examined: 4 Sentinel                               0 non-sentinel                                  4: total      Pelvic Lymph Nodes with Metastasis: 0 Distant Metastasis:      Distant Site(s) Involved: Not applicable Pathologic Stage Classification (pTNM, AJCC 8th Edition): pT1a, pN0 Ancillary Studies: MMR / MSI testing will be ordered Representative Tumor Block: E8 Comment(s): Appropriately controlled p53 immunohistochemical stain shows wild-type staining - Pancytokeratin stain was performed on the lymph nodes and is negative.     Genetic Testing   Negative genetic testing. No pathogenic variants identified on the [facility] Common Hereditary Cancers+RNA panel. The report date is 11/23/2022.  The  Common Hereditary Cancers Panel + RNA offered by [facility] includes sequencing and/or deletion duplication testing of the following 48 genes: APC*, ATM*, AXIN2, BAP1, BARD1, BMPR1A, BRCA1, BRCA2, BRIP1, CDH1, CDK4, CDKN2A (p14ARF), CDKN2A (p16INK4a),  CHEK2, CTNNA1, DICER1*, EPCAM*, FH*, GREM1*, HOXB13, KIT, MBD4, MEN1*, MLH1*, MSH2*, MSH3*, MSH6*, MUTYH, NF1*, NTHL1, PALB2, PDGFRA, PMS2*, POLD1*, POLE, PTEN*, RAD51C, RAD51D, SDHA*, SDHB, SDHC*, SDHD, SMAD4, SMARCA4, STK11, TP53, TSC1*, TSC2, VHL.      Interval History: Doing well.  Denies any vaginal bleeding since her last visit.  Used vaginal estrogen for just a short period of time until spotting had stopped.  Endorses some intermittent constipation, diet treated.  Denies any urinary symptoms.  Denies any abdominal or pelvic pain.  Past Medical/Surgical History: Past Medical History:  Diagnosis Date   Arthritis    osteoarthritis- knees, hips, rt. hip bursitis   Diabetes mellitus type 2 in obese    Hemangioma of liver    very small- evaluated and considered stable- no further problems or follow up in many years   History of kidney stones    x1 lithotripsy-has others not a bother at this time   History of palpitations    during menopause, on metoprolol. Saw cardiologist   Hypertension    Obesity (BMI 30-39.9)    Transfusion history    '1981- s/p childbirth    Past Surgical History:  Procedure Laterality Date   CATARACT EXTRACTION, BILATERAL Bilateral    5 yrs ago   DILATION AND CURETTAGE OF UTERUS  2010   perimenopause, caused bleeding, had polyp removed   LITHOTRIPSY     ROBOTIC ASSISTED TOTAL HYSTERECTOMY WITH BILATERAL SALPINGO OOPHERECTOMY N/A 10/03/2022   Procedure: XI ROBOTIC ASSISTED TOTAL HYSTERECTOMY WITH BILATERAL SALPINGO OOPHORECTOMY;  Surgeon: Carver Fila, MD;  Location: [facility];  Service: Gynecology;  Laterality: N/A;   SENTINEL NODE BIOPSY N/A 10/03/2022   Procedure: SENTINEL NODE BIOPSY;  Surgeon: Carver Fila, MD;  Location: [facility];  Service: Gynecology;  Laterality: N/A;   TONSILLECTOMY     child   TOTAL KNEE ARTHROPLASTY Right 05/24/2014   Procedure: RIGHT TOTAL KNEE ARTHROPLASTY;  Surgeon: Loanne Drilling, MD;  Location: [facility];  Service:  Orthopedics;  Laterality: Right;   TOTAL KNEE ARTHROPLASTY Left 05/30/2015   Procedure: LEFT TOTAL KNEE ARTHROPLASTY;  Surgeon: Ollen Gross, MD;  Location: [facility];  Service: Orthopedics;  Laterality: Left;    Family History  Problem Relation Age of Onset   Liver disease Mother    Heart disease Father        ICD.  Father is 69   Prostate cancer Father 60   Colon cancer Neg Hx    Breast cancer Neg Hx    Ovarian cancer Neg Hx    Endometrial cancer Neg Hx    Pancreatic cancer Neg Hx     Social History   Socioeconomic History   Marital status: Married    Spouse name: Not on file   Number of children: 2   Years of education: Not on file   Highest education level: Not on file  Occupational History   Occupation: retired  Tobacco Use   Smoking status: Never   Smokeless tobacco: Never  Vaping Use   Vaping Use: Never used  Substance and Sexual Activity   Alcohol use: Never   Drug use: No   Sexual activity: Yes  Other Topics Concern   Not on file  Social History Narrative   Lives with husband.  Social Determinants of Health   Financial Resource Strain: Low Risk  (09/13/2022)   Overall Financial Resource Strain (CARDIA)    Difficulty of Paying Living Expenses: Not hard at all  Food Insecurity: No Food Insecurity (09/13/2022)   Hunger Vital Sign    Worried About Running Out of Food in the Last Year: Never true    Ran Out of Food in the Last Year: Never true  Transportation Needs: No Transportation Needs (09/13/2022)   PRAPARE - Administrator, Civil Service (Medical): No    Lack of Transportation (Non-Medical): No  Physical Activity: Not on file  Stress: Not on file  Social Connections: Not on file    Current Medications:  Current Outpatient Medications:    Accu-Chek FastClix Lancets MISC, Apply topically daily., Disp: , Rfl:    ACCU-CHEK GUIDE test strip, daily., Disp: , Rfl:    atorvastatin (LIPITOR) 20 MG tablet, Take 20 mg by mouth at bedtime.,  Disp: , Rfl:    Calcium Carb-Cholecalciferol (CALCIUM + VITAMIN D3 PO), Take 1 tablet by mouth every evening., Disp: , Rfl:    cetirizine (ZYRTEC) 10 MG tablet, Take 10 mg by mouth daily., Disp: , Rfl:    Cholecalciferol (VITAMIN D3) 50 MCG (2000 UT) CAPS, Take 2,000 Units by mouth daily., Disp: , Rfl:    ciclopirox (LOPROX) 0.77 % cream, Apply 1 application  topically 2 (two) times daily as needed (yeast under breasts)., Disp: , Rfl:    lisinopril-hydrochlorothiazide (PRINZIDE,ZESTORETIC) 20-12.5 MG per tablet, Take 1 tablet by mouth every morning., Disp: , Rfl:    metFORMIN (GLUCOPHAGE-XR) 500 MG 24 hr tablet, Take 500 mg by mouth every evening., Disp: , Rfl:    metoprolol succinate (TOPROL-XL) 50 MG 24 hr tablet, Take 50 mg by mouth every evening. Take with or immediately following a meal., Disp: , Rfl:    Multiple Vitamin (MULTIVITAMIN) tablet, Take 1 tablet by mouth daily., Disp: , Rfl:   Review of Systems: Denies appetite changes, fevers, chills, fatigue, unexplained weight changes. Denies hearing loss, neck lumps or masses, mouth sores, ringing in ears or voice changes. Denies cough or wheezing.  Denies shortness of breath. Denies chest pain or palpitations. Denies leg swelling. Denies abdominal distention, pain, blood in stools, constipation, diarrhea, nausea, vomiting, or early satiety. Denies pain with intercourse, dysuria, frequency, hematuria or incontinence. Denies hot flashes, pelvic pain, vaginal bleeding or vaginal discharge.   Denies joint pain, back pain or muscle pain/cramps. Denies itching, rash, or wounds. Denies dizziness, headaches, numbness or seizures. Denies swollen lymph nodes or glands, denies easy bruising or bleeding. Denies anxiety, depression, confusion, or decreased concentration.  Physical Exam: BP 130/83 (BP Location: Right Arm, Patient Position: Sitting)   Pulse 75   Temp (!) 97.5 F (36.4 C) (Oral)   Ht 5' 4.96" (1.65 m)   Wt 228 lb (103.4 kg)    SpO2 98%   BMI 37.99 kg/m  General: Alert, oriented, no acute distress. HEENT: Atraumatic, normocephalic, sclera anicteric. Chest: Clear to auscultation bilaterally.  No wheezes or rhonchi. Cardiovascular: Regular rate and rhythm, no murmurs. Abdomen: Obese, soft, nontender.  Normoactive bowel sounds.  No masses or hepatosplenomegaly appreciated.  Well-healed incisions. Extremities: Grossly normal range of motion.  Warm, well perfused.  No edema bilaterally. Skin: No rashes or lesions noted. Lymphatics: No cervical, supraclavicular, or inguinal adenopathy. GU: Normal appearing external genitalia without erythema, excoriation, or lesions.  Speculum exam reveals mildly atrophic vaginal mucosa.  Small area along the anterior vagina with  some increased atrophy and petechiae, no active bleeding.  No atypical vascularity or lesions noted.  Cuff is intact with minimal adhesions.  Bimanual exam reveals cuff intact, no nodularity or masses.  Rectovaginal exam confirms findings.  Laboratory & Radiologic Studies: None new  Assessment & Plan: Morgan Frye is a 72 y.o. 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 MLH1 promoter hypermethylation testing is absent.  Germline genetic testing.  The patient is doing well, is any patient is doing very well, is NED on exam today.  Per NCCN surveillance recommendations, we will plan on surveillance visits every 6 months for 5 years and then yearly after that.  We will plan to alternate visits between my office and her OB/GYN.  I have asked her to call her OB/GYN to schedule visit in 6 months.  I will plan to see her back in a year.  We reviewed signs and symptoms that would be concerning for cancer recurrence.  I stressed the importance of calling if she develops any of these.  20 minutes of total time was spent for this patient encounter, including preparation,  face-to-face counseling with the patient and coordination of care, and documentation of the encounter.  Eugene Garnet, MD  Division of Gynecologic Oncology  Department of Obstetrics and Gynecology  [facility]

## 2023-06-14 DIAGNOSIS — I700 Atherosclerosis of aorta: Secondary | ICD-10-CM | POA: Diagnosis not present

## 2023-06-14 DIAGNOSIS — Z79899 Other long term (current) drug therapy: Secondary | ICD-10-CM | POA: Diagnosis not present

## 2023-06-14 DIAGNOSIS — N3000 Acute cystitis without hematuria: Secondary | ICD-10-CM | POA: Diagnosis not present

## 2023-06-14 DIAGNOSIS — I10 Essential (primary) hypertension: Secondary | ICD-10-CM | POA: Diagnosis not present

## 2023-06-14 DIAGNOSIS — E1165 Type 2 diabetes mellitus with hyperglycemia: Secondary | ICD-10-CM | POA: Diagnosis not present

## 2023-06-14 DIAGNOSIS — Z1211 Encounter for screening for malignant neoplasm of colon: Secondary | ICD-10-CM | POA: Diagnosis not present

## 2023-06-17 DIAGNOSIS — Z1211 Encounter for screening for malignant neoplasm of colon: Secondary | ICD-10-CM | POA: Diagnosis not present

## 2023-10-16 DIAGNOSIS — Z01419 Encounter for gynecological examination (general) (routine) without abnormal findings: Secondary | ICD-10-CM | POA: Diagnosis not present

## 2023-11-30 DIAGNOSIS — C50919 Malignant neoplasm of unspecified site of unspecified female breast: Secondary | ICD-10-CM

## 2023-11-30 HISTORY — DX: Malignant neoplasm of unspecified site of unspecified female breast: C50.919

## 2023-12-03 DIAGNOSIS — Z1231 Encounter for screening mammogram for malignant neoplasm of breast: Secondary | ICD-10-CM | POA: Diagnosis not present

## 2023-12-11 DIAGNOSIS — N6323 Unspecified lump in the left breast, lower outer quadrant: Secondary | ICD-10-CM | POA: Diagnosis not present

## 2023-12-11 DIAGNOSIS — N6321 Unspecified lump in the left breast, upper outer quadrant: Secondary | ICD-10-CM | POA: Diagnosis not present

## 2023-12-16 DIAGNOSIS — C50812 Malignant neoplasm of overlapping sites of left female breast: Secondary | ICD-10-CM | POA: Diagnosis not present

## 2023-12-16 DIAGNOSIS — Z1721 Progesterone receptor positive status: Secondary | ICD-10-CM | POA: Diagnosis not present

## 2023-12-16 DIAGNOSIS — N6325 Unspecified lump in the left breast, overlapping quadrants: Secondary | ICD-10-CM | POA: Diagnosis not present

## 2023-12-16 DIAGNOSIS — Z170 Estrogen receptor positive status [ER+]: Secondary | ICD-10-CM | POA: Diagnosis not present

## 2023-12-17 DIAGNOSIS — Z0000 Encounter for general adult medical examination without abnormal findings: Secondary | ICD-10-CM | POA: Diagnosis not present

## 2023-12-17 DIAGNOSIS — E1165 Type 2 diabetes mellitus with hyperglycemia: Secondary | ICD-10-CM | POA: Diagnosis not present

## 2023-12-17 DIAGNOSIS — D1803 Hemangioma of intra-abdominal structures: Secondary | ICD-10-CM | POA: Diagnosis not present

## 2023-12-17 DIAGNOSIS — E7800 Pure hypercholesterolemia, unspecified: Secondary | ICD-10-CM | POA: Diagnosis not present

## 2023-12-17 DIAGNOSIS — I10 Essential (primary) hypertension: Secondary | ICD-10-CM | POA: Diagnosis not present

## 2023-12-17 DIAGNOSIS — Z79899 Other long term (current) drug therapy: Secondary | ICD-10-CM | POA: Diagnosis not present

## 2023-12-17 DIAGNOSIS — E119 Type 2 diabetes mellitus without complications: Secondary | ICD-10-CM | POA: Diagnosis not present

## 2023-12-17 DIAGNOSIS — K7460 Unspecified cirrhosis of liver: Secondary | ICD-10-CM | POA: Diagnosis not present

## 2023-12-17 LAB — SURGICAL PATHOLOGY

## 2023-12-19 ENCOUNTER — Telehealth: Payer: Self-pay | Admitting: *Deleted

## 2023-12-19 NOTE — Telephone Encounter (Signed)
Spoke to patient to confirm upcoming morning [facility] clinic appointment on 2/26, paperwork will be sent via [employer].  Gave location and time, also informed patient that the surgeon's office would be calling as well to get information from them similar to the packet that they will be receiving so make sure to do both.  Reminded patient that all providers will be coming to the clinic to see them HERE and if they had any questions to not hesitate to reach back out to myself or their navigators.

## 2023-12-20 ENCOUNTER — Encounter: Payer: Self-pay | Admitting: Gynecologic Oncology

## 2023-12-23 ENCOUNTER — Encounter: Payer: Self-pay | Admitting: *Deleted

## 2023-12-23 DIAGNOSIS — Z170 Estrogen receptor positive status [ER+]: Secondary | ICD-10-CM | POA: Insufficient documentation

## 2023-12-24 NOTE — Progress Notes (Unsigned)
 [facility] CONSULT NOTE  Patient Care Team: Darrow Bussing, MD as PCP - General (Family Medicine) Donnelly Angelica, RN as Oncology Nurse Navigator Pershing Proud, RN as Oncology Nurse Navigator Harriette Bouillon, MD as Consulting Physician (General Surgery) Rachel Moulds, MD as Consulting Physician (Hematology and Oncology) Lonie Peak, MD as Attending Physician (Radiation Oncology)  CHIEF COMPLAINTS/PURPOSE OF CONSULTATION:  Left breast cancer  ASSESSMENT & PLAN:  No problem-specific Assessment & Plan notes found for this encounter.  No orders of the defined types were placed in this encounter.    HISTORY OF PRESENTING ILLNESS:  Morgan Frye 73 y.o. female is here because of new diagnosis of left sided multifocal breast cancer.  REVIEW OF SYSTEMS:   Constitutional: Denies fevers, chills or abnormal night sweats Eyes: Denies blurriness of vision, double vision or watery eyes Ears, nose, mouth, throat, and face: Denies mucositis or sore throat Respiratory: Denies cough, dyspnea or wheezes Cardiovascular: Denies palpitation, chest discomfort or lower extremity swelling Gastrointestinal:  Denies nausea, heartburn or change in bowel habits Skin: Denies abnormal skin rashes Lymphatics: Denies new lymphadenopathy or easy bruising Neurological:Denies numbness, tingling or new weaknesses Behavioral/Psych: Mood is stable, no new changes  All other systems were reviewed with the patient and are negative.  MEDICAL HISTORY:  Past Medical History:  Diagnosis Date   Arthritis    osteoarthritis- knees, hips, rt. hip bursitis   Diabetes mellitus type 2 in obese    Hemangioma of liver    very small- evaluated and considered stable- no further problems or follow up in many years   History of kidney stones    x1 lithotripsy-has others not a bother at this time   History of palpitations    during menopause, on metoprolol. Saw cardiologist   Hypertension    Obesity  (BMI 30-39.9)    Transfusion history    '1981- s/p childbirth    SURGICAL HISTORY: Past Surgical History:  Procedure Laterality Date   CATARACT EXTRACTION, BILATERAL Bilateral    5 yrs ago   DILATION AND CURETTAGE OF UTERUS  2010   perimenopause, caused bleeding, had polyp removed   LITHOTRIPSY     ROBOTIC ASSISTED TOTAL HYSTERECTOMY WITH BILATERAL SALPINGO OOPHERECTOMY N/A 10/03/2022   Procedure: XI ROBOTIC ASSISTED TOTAL HYSTERECTOMY WITH BILATERAL SALPINGO OOPHORECTOMY;  Surgeon: Carver Fila, MD;  Location: [facility];  Service: Gynecology;  Laterality: N/A;   SENTINEL NODE BIOPSY N/A 10/03/2022   Procedure: SENTINEL NODE BIOPSY;  Surgeon: Carver Fila, MD;  Location: [facility];  Service: Gynecology;  Laterality: N/A;   TONSILLECTOMY     child   TOTAL KNEE ARTHROPLASTY Right 05/24/2014   Procedure: RIGHT TOTAL KNEE ARTHROPLASTY;  Surgeon: Loanne Drilling, MD;  Location: [facility];  Service: Orthopedics;  Laterality: Right;   TOTAL KNEE ARTHROPLASTY Left 05/30/2015   Procedure: LEFT TOTAL KNEE ARTHROPLASTY;  Surgeon: Ollen Gross, MD;  Location: [facility];  Service: Orthopedics;  Laterality: Left;    SOCIAL HISTORY: Social History   Socioeconomic History   Marital status: Married    Spouse name: Not on file   Number of children: 2   Years of education: Not on file   Highest education level: Not on file  Occupational History   Occupation: retired  Tobacco Use   Smoking status: Never   Smokeless tobacco: Never  Vaping Use   Vaping status: Never Used  Substance and Sexual Activity   Alcohol use: Never   Drug use: No  Sexual activity: Yes  Other Topics Concern   Not on file  Social History Narrative   Lives with husband.     Social Drivers of Corporate investment banker Strain: Low Risk  (09/13/2022)   Overall Financial Resource Strain (CARDIA)    Difficulty of Paying Living Expenses: Not hard at all  Food Insecurity: No Food Insecurity (09/13/2022)   Hunger  Vital Sign    Worried About Running Out of Food in the Last Year: Never true    Ran Out of Food in the Last Year: Never true  Transportation Needs: No Transportation Needs (09/13/2022)   PRAPARE - Administrator, Civil Service (Medical): No    Lack of Transportation (Non-Medical): No  Physical Activity: Not on file  Stress: Not on file  Social Connections: Not on file  Intimate Partner Violence: Not on file    FAMILY HISTORY: Family History  Problem Relation Age of Onset   Liver disease Mother    Heart disease Father        ICD.  Father is 3   Prostate cancer Father 84   Colon cancer Neg Hx    Breast cancer Neg Hx    Ovarian cancer Neg Hx    Endometrial cancer Neg Hx    Pancreatic cancer Neg Hx     ALLERGIES:  is allergic to penicillins and tylenol [acetaminophen].  MEDICATIONS:  Current Outpatient Medications  Medication Sig Dispense Refill   Accu-Chek FastClix Lancets MISC Apply topically daily.     ACCU-CHEK GUIDE test strip daily.     atorvastatin (LIPITOR) 20 MG tablet Take 20 mg by mouth at bedtime.     Calcium Carb-Cholecalciferol (CALCIUM + VITAMIN D3 PO) Take 1 tablet by mouth every evening.     cetirizine (ZYRTEC) 10 MG tablet Take 10 mg by mouth daily.     Cholecalciferol (VITAMIN D3) 50 MCG (2000 UT) CAPS Take 2,000 Units by mouth daily.     ciclopirox (LOPROX) 0.77 % cream Apply 1 application  topically 2 (two) times daily as needed (yeast under breasts).     lisinopril-hydrochlorothiazide (PRINZIDE,ZESTORETIC) 20-12.5 MG per tablet Take 1 tablet by mouth every morning.     metFORMIN (GLUCOPHAGE-XR) 500 MG 24 hr tablet Take 500 mg by mouth every evening.     metoprolol succinate (TOPROL-XL) 50 MG 24 hr tablet Take 50 mg by mouth every evening. Take with or immediately following a meal.     Multiple Vitamin (MULTIVITAMIN) tablet Take 1 tablet by mouth daily.     No current facility-administered medications for this visit.     PHYSICAL  EXAMINATION: ECOG PERFORMANCE STATUS: {CHL ONC ECOG [phone]}  There were no vitals filed for this visit. There were no vitals filed for this visit.  GENERAL:alert, no distress and comfortable SKIN: skin color, texture, turgor are normal, no rashes or significant lesions EYES: normal, conjunctiva are pink and non-injected, sclera clear OROPHARYNX:no exudate, no erythema and lips, buccal mucosa, and tongue normal  NECK: supple, thyroid normal size, non-tender, without nodularity LYMPH:  no palpable lymphadenopathy in the cervical, axillary or inguinal LUNGS: clear to auscultation and percussion with normal breathing effort HEART: regular rate & rhythm and no murmurs and no lower extremity edema ABDOMEN:abdomen soft, non-tender and normal bowel sounds Musculoskeletal:no cyanosis of digits and no clubbing  PSYCH: alert & oriented x 3 with fluent speech NEURO: no focal motor/sensory deficits  LABORATORY DATA:  I have reviewed the data as listed Lab Results  Component  Value Date   WBC 6.5 10/12/2022   HGB 11.7 (L) 10/12/2022   HCT 33.1 (L) 10/12/2022   MCV 91.9 10/12/2022   PLT 219 10/12/2022     Chemistry      Component Value Date/Time   NA 139 10/08/2022 1350   K 3.8 10/08/2022 1350   CL 105 10/08/2022 1350   CO2 29 10/08/2022 1350   BUN 14 10/08/2022 1350   CREATININE 0.65 10/08/2022 1350      Component Value Date/Time   CALCIUM 9.3 10/08/2022 1350   ALKPHOS 60 10/05/2022 1418   AST 23 10/05/2022 1418   ALT 18 10/05/2022 1418   BILITOT 1.1 10/05/2022 1418       RADIOGRAPHIC STUDIES: I have personally reviewed the radiological images as listed and agreed with the findings in the report. No results found.  All questions were answered. The patient knows to call the clinic with any problems, questions or concerns. I spent *** minutes in the care of this patient including H and P, review of records, counseling and coordination of care.     Rachel Moulds,  MD 12/24/2023 6:36 PM

## 2023-12-24 NOTE — Progress Notes (Signed)
 Radiation Oncology         [phone] ________________________________  Initial Outpatient Consultation  Name: Morgan Frye MRN: [id number]  Date: 12/25/2023  DOB: 04/23/1951  WU:[id number], Dibas, MD  Harriette Bouillon, MD   REFERRING PHYSICIAN: Harriette Bouillon, MD  DIAGNOSIS: No diagnosis found.   Cancer Staging  No matching staging information was found for the patient.   Stage *** Overlapping sites of the Left Breast, *** Carcinoma ***, ER+ / PR*** / Her2***, Grade ***  CHIEF COMPLAINT: Here to discuss management of left breast cancer  HISTORY OF PRESENT ILLNESS::Morgan Frye is a 73 y.o. female who presented with a left breast abnormality on a screening mammogram performed on and unknown date (report not available on EMR). She then presented for a left breast diagnostic mammogram at [facility] on 12/11/23 which demonstrated an indeterminate mass in the 3 o'clock left breast at a posterior depth, measuring 1.1 cm, as well as a second indeterminate mass in the 3 o'clock left breast at a middle depth measuring approximately 1 cm. A left breast ultrasound was subsequently performed (on that same date), which redemonstrated the 1.1 cm mass in the 3 o'clock left breast at a posterior depth, and the second 1 cm mass in the 3 o'clock left breast at a middle depth, both of which with features suspicious of malignancy. Collectively, the masses measure 3.5 cm, and appear to be located approximately 2 cm apart. No abnormal appearing lymph nodes were demonstrated in the left axilla.   Biopsy on date of *** showed ***.  ER status: ***; PR status ***, Her2 status ***; Grade ***.  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 Pricilla Holm; no indication for adjuvant therapy  based on low-risk histology   Of note: In the setting of her history of endometrial cancer, she qualified for genetic testing - sample collected on 11/12/22. Results showed no clinically significant variants detected by [facility] genetic testing.   PREVIOUS RADIATION THERAPY: No  PAST MEDICAL HISTORY:  has a past medical history of Arthritis, Diabetes mellitus type 2 in obese, Hemangioma of liver, History of kidney stones, History of palpitations, Hypertension, Obesity (BMI 30-39.9), and Transfusion history.    PAST SURGICAL HISTORY: Past Surgical History:  Procedure Laterality Date   CATARACT EXTRACTION, BILATERAL Bilateral    5 yrs ago   DILATION AND CURETTAGE OF UTERUS  2010   perimenopause, caused bleeding, had polyp removed   LITHOTRIPSY     ROBOTIC ASSISTED TOTAL HYSTERECTOMY WITH BILATERAL SALPINGO OOPHERECTOMY N/A 10/03/2022   Procedure: XI ROBOTIC ASSISTED TOTAL HYSTERECTOMY WITH BILATERAL SALPINGO OOPHORECTOMY;  Surgeon: Carver Fila, MD;  Location: [facility];  Service: Gynecology;  Laterality: N/A;   SENTINEL NODE BIOPSY N/A 10/03/2022   Procedure: SENTINEL NODE BIOPSY;  Surgeon: Carver Fila, MD;  Location: [facility];  Service: Gynecology;  Laterality: N/A;   TONSILLECTOMY     child   TOTAL KNEE ARTHROPLASTY Right 05/24/2014   Procedure: RIGHT TOTAL KNEE ARTHROPLASTY;  Surgeon: Loanne Drilling, MD;  Location: [facility];  Service: Orthopedics;  Laterality: Right;   TOTAL KNEE ARTHROPLASTY Left 05/30/2015   Procedure: LEFT TOTAL KNEE ARTHROPLASTY;  Surgeon: Ollen Gross, MD;  Location: [facility];  Service: Orthopedics;  Laterality: Left;    FAMILY HISTORY: family history includes Heart  disease in her father; Liver disease in her mother; Prostate cancer (age of onset: 42) in her father.  SOCIAL HISTORY:  reports that she has never smoked. She has never used smokeless tobacco. She reports that she does not drink alcohol and does not use drugs.  ALLERGIES: Penicillins and  Tylenol [acetaminophen]  MEDICATIONS:  Current Outpatient Medications  Medication Sig Dispense Refill   Accu-Chek FastClix Lancets MISC Apply topically daily.     ACCU-CHEK GUIDE test strip daily.     atorvastatin (LIPITOR) 20 MG tablet Take 20 mg by mouth at bedtime.     Calcium Carb-Cholecalciferol (CALCIUM + VITAMIN D3 PO) Take 1 tablet by mouth every evening.     cetirizine (ZYRTEC) 10 MG tablet Take 10 mg by mouth daily.     Cholecalciferol (VITAMIN D3) 50 MCG (2000 UT) CAPS Take 2,000 Units by mouth daily.     ciclopirox (LOPROX) 0.77 % cream Apply 1 application  topically 2 (two) times daily as needed (yeast under breasts).     lisinopril-hydrochlorothiazide (PRINZIDE,ZESTORETIC) 20-12.5 MG per tablet Take 1 tablet by mouth every morning.     metFORMIN (GLUCOPHAGE-XR) 500 MG 24 hr tablet Take 500 mg by mouth every evening.     metoprolol succinate (TOPROL-XL) 50 MG 24 hr tablet Take 50 mg by mouth every evening. Take with or immediately following a meal.     Multiple Vitamin (MULTIVITAMIN) tablet Take 1 tablet by mouth daily.     No current facility-administered medications for this encounter.    REVIEW OF SYSTEMS: As above in HPI.   PHYSICAL EXAM:  vitals were not taken for this visit.   General: Alert and oriented, in no acute distress HEENT: Head is normocephalic. Extraocular movements are intact. Oropharynx is clear. Neck: Neck is supple, no palpable cervical or supraclavicular lymphadenopathy. Heart: Regular in rate and rhythm with no murmurs, rubs, or gallops. Chest: Clear to auscultation bilaterally, with no rhonchi, wheezes, or rales. Abdomen: Soft, nontender, nondistended, with no rigidity or guarding. Extremities: No cyanosis or edema. Lymphatics: see Neck Exam Skin: No concerning lesions. Musculoskeletal: symmetric strength and muscle tone throughout. Neurologic: Cranial nerves II through XII are grossly intact. No obvious focalities. Speech is fluent. Coordination  is intact. Psychiatric: Judgment and insight are intact. Affect is appropriate. Breasts: *** . No other palpable masses appreciated in the breasts or axillae *** .    ECOG = ***  0 - Asymptomatic (Fully active, able to carry on all predisease activities without restriction)  1 - Symptomatic but completely ambulatory (Restricted in physically strenuous activity but ambulatory and able to carry out work of a light or sedentary nature. For example, light housework, office work)  2 - Symptomatic, <50% in bed during the day (Ambulatory and capable of all self care but unable to carry out any work activities. Up and about more than 50% of waking hours)  3 - Symptomatic, >50% in bed, but not bedbound (Capable of only limited self-care, confined to bed or chair 50% or more of waking hours)  4 - Bedbound (Completely disabled. Cannot carry on any self-care. Totally confined to bed or chair)  5 - Death   Santiago Glad MM, Creech RH, Tormey DC, et al. [phone]). "Toxicity and response criteria of the [facility]". Am. Evlyn Clines. Oncol. 5 (6): 649-55   LABORATORY DATA:  Lab Results  Component Value Date   WBC 6.5 10/12/2022   HGB 11.7 (L) 10/12/2022   HCT 33.1 (L) 10/12/2022   MCV  91.9 10/12/2022   PLT 219 10/12/2022   CMP     Component Value Date/Time   NA 139 10/08/2022 1350   K 3.8 10/08/2022 1350   CL 105 10/08/2022 1350   CO2 29 10/08/2022 1350   GLUCOSE 178 (H) 10/08/2022 1350   BUN 14 10/08/2022 1350   CREATININE 0.65 10/08/2022 1350   CALCIUM 9.3 10/08/2022 1350   PROT 6.3 (L) 10/05/2022 1418   ALBUMIN 4.0 10/05/2022 1418   AST 23 10/05/2022 1418   ALT 18 10/05/2022 1418   ALKPHOS 60 10/05/2022 1418   BILITOT 1.1 10/05/2022 1418   GFRNONAA >60 10/08/2022 1350   GFRAA >60 06/01/2015 0400         RADIOGRAPHY: No results found.    IMPRESSION/PLAN: ***   It was a pleasure meeting the patient today. We discussed the risks, benefits, and side effects of  radiotherapy. I recommend radiotherapy to the *** to reduce her risk of locoregional recurrence by 2/3.  We discussed that radiation would take approximately *** weeks to complete and that I would give the patient a few weeks to heal following surgery before starting treatment planning. *** If chemotherapy were to be given, this would precede radiotherapy. We spoke about acute effects including skin irritation and fatigue as well as much less common late effects including internal organ injury or irritation. We spoke about the latest technology that is used to minimize the risk of late effects for patients undergoing radiotherapy to the breast or chest wall. No guarantees of treatment were given. The patient is enthusiastic about proceeding with treatment. I look forward to participating in the patient's care.  I will await her referral back to me for postoperative follow-up and eventual CT simulation/treatment planning.  On date of service, in total, I spent *** minutes on this encounter. Patient was seen in person.   __________________________________________   Lonie Peak, MD  This document serves as a record of services personally performed by Lonie Peak, MD. It was created on her behalf by Neena Rhymes, a trained medical scribe. The creation of this record is based on the scribe's personal observations and the provider's statements to them. This document has been checked and approved by the attending provider.

## 2023-12-25 ENCOUNTER — Other Ambulatory Visit: Payer: Self-pay

## 2023-12-25 ENCOUNTER — Inpatient Hospital Stay: Payer: Medicare PPO | Admitting: Licensed Clinical Social Worker

## 2023-12-25 ENCOUNTER — Inpatient Hospital Stay: Payer: Medicare PPO | Attending: Hematology and Oncology

## 2023-12-25 ENCOUNTER — Inpatient Hospital Stay (HOSPITAL_BASED_OUTPATIENT_CLINIC_OR_DEPARTMENT_OTHER): Payer: Medicare PPO | Admitting: Hematology and Oncology

## 2023-12-25 ENCOUNTER — Ambulatory Visit: Payer: Medicare PPO | Attending: Surgery | Admitting: Physical Therapy

## 2023-12-25 ENCOUNTER — Encounter: Payer: Self-pay | Admitting: Hematology and Oncology

## 2023-12-25 ENCOUNTER — Ambulatory Visit: Payer: Self-pay | Admitting: Surgery

## 2023-12-25 ENCOUNTER — Encounter: Payer: Self-pay | Admitting: Physical Therapy

## 2023-12-25 ENCOUNTER — Encounter: Payer: Self-pay | Admitting: Radiation Oncology

## 2023-12-25 ENCOUNTER — Ambulatory Visit
Admission: RE | Admit: 2023-12-25 | Discharge: 2023-12-25 | Disposition: A | Discharge status: Home / Self Care | Payer: Medicare PPO | Source: Ambulatory Visit | Attending: Radiation Oncology | Admitting: Radiation Oncology

## 2023-12-25 VITALS — BP 127/67 | HR 76 | Temp 97.9°F | Resp 18 | Ht 64.96 in | Wt 228.5 lb

## 2023-12-25 DIAGNOSIS — Z79899 Other long term (current) drug therapy: Secondary | ICD-10-CM | POA: Diagnosis not present

## 2023-12-25 DIAGNOSIS — C50412 Malignant neoplasm of upper-outer quadrant of left female breast: Secondary | ICD-10-CM | POA: Diagnosis not present

## 2023-12-25 DIAGNOSIS — Z8542 Personal history of malignant neoplasm of other parts of uterus: Secondary | ICD-10-CM | POA: Insufficient documentation

## 2023-12-25 DIAGNOSIS — Z7984 Long term (current) use of oral hypoglycemic drugs: Secondary | ICD-10-CM | POA: Insufficient documentation

## 2023-12-25 DIAGNOSIS — Z171 Estrogen receptor negative status [ER-]: Secondary | ICD-10-CM | POA: Insufficient documentation

## 2023-12-25 DIAGNOSIS — Z1732 Human epidermal growth factor receptor 2 negative status: Secondary | ICD-10-CM

## 2023-12-25 DIAGNOSIS — I10 Essential (primary) hypertension: Secondary | ICD-10-CM | POA: Diagnosis not present

## 2023-12-25 DIAGNOSIS — Z1721 Progesterone receptor positive status: Secondary | ICD-10-CM | POA: Diagnosis not present

## 2023-12-25 DIAGNOSIS — C50812 Malignant neoplasm of overlapping sites of left female breast: Secondary | ICD-10-CM | POA: Insufficient documentation

## 2023-12-25 DIAGNOSIS — Z90710 Acquired absence of both cervix and uterus: Secondary | ICD-10-CM | POA: Diagnosis not present

## 2023-12-25 DIAGNOSIS — E7800 Pure hypercholesterolemia, unspecified: Secondary | ICD-10-CM | POA: Insufficient documentation

## 2023-12-25 DIAGNOSIS — Z170 Estrogen receptor positive status [ER+]: Secondary | ICD-10-CM | POA: Diagnosis not present

## 2023-12-25 DIAGNOSIS — E119 Type 2 diabetes mellitus without complications: Secondary | ICD-10-CM | POA: Insufficient documentation

## 2023-12-25 DIAGNOSIS — R293 Abnormal posture: Secondary | ICD-10-CM | POA: Diagnosis not present

## 2023-12-25 DIAGNOSIS — C50912 Malignant neoplasm of unspecified site of left female breast: Secondary | ICD-10-CM

## 2023-12-25 LAB — CBC WITH DIFFERENTIAL (CANCER CENTER ONLY)
Abs Immature Granulocytes: 0.02 10*3/uL (ref 0.00–0.07)
Basophils Absolute: 0.1 10*3/uL (ref 0.0–0.1)
Basophils Relative: 1 %
Eosinophils Absolute: 0.1 10*3/uL (ref 0.0–0.5)
Eosinophils Relative: 1 %
HCT: 45.6 % (ref 36.0–46.0)
Hemoglobin: 16.3 g/dL — ABNORMAL HIGH (ref 12.0–15.0)
Immature Granulocytes: 0 %
Lymphocytes Relative: 20 %
Lymphs Abs: 1.2 10*3/uL (ref 0.7–4.0)
MCH: 32.7 pg (ref 26.0–34.0)
MCHC: 35.7 g/dL (ref 30.0–36.0)
MCV: 91.4 fL (ref 80.0–100.0)
Monocytes Absolute: 0.5 10*3/uL (ref 0.1–1.0)
Monocytes Relative: 8 %
Neutro Abs: 4.1 10*3/uL (ref 1.7–7.7)
Neutrophils Relative %: 70 %
Platelet Count: 172 10*3/uL (ref 150–400)
RBC: 4.99 MIL/uL (ref 3.87–5.11)
RDW: 12.6 % (ref 11.5–15.5)
WBC Count: 6 10*3/uL (ref 4.0–10.5)
nRBC: 0 % (ref 0.0–0.2)

## 2023-12-25 LAB — CMP (CANCER CENTER ONLY)
ALT: 21 U/L (ref 0–44)
AST: 23 U/L (ref 15–41)
Albumin: 4.3 g/dL (ref 3.5–5.0)
Alkaline Phosphatase: 73 U/L (ref 38–126)
Anion gap: 7 (ref 5–15)
BUN: 16 mg/dL (ref 8–23)
CO2: 27 mmol/L (ref 22–32)
Calcium: 9.7 mg/dL (ref 8.9–10.3)
Chloride: 105 mmol/L (ref 98–111)
Creatinine: 0.71 mg/dL (ref 0.44–1.00)
GFR, Estimated: >60 mL/min (ref >60)
Glucose, Bld: 146 mg/dL — ABNORMAL HIGH (ref 70–99)
Potassium: 3.7 mmol/L (ref 3.5–5.1)
Sodium: 139 mmol/L (ref 135–145)
Total Bilirubin: 1 mg/dL (ref 0.0–1.2)
Total Protein: 6.7 g/dL (ref 6.5–8.1)

## 2023-12-25 LAB — GENETIC SCREENING ORDER

## 2023-12-25 NOTE — Assessment & Plan Note (Signed)
 Invasive Lobular Breast Cancer: Multifocal, small, and close together lesions in the left breast. Intermediate to high aggressiveness. ER positive strongly, PR variable staining. HER2 negative. -We have discussed about upfront surgery given the favorable histology small tumor.`  -Perform Oncotype DX test on the tumor to determine risk of recurrence and chemotherapy. -Consider chemotherapy based on Oncotype DX results. -If no chemotherapy is needed, proceed with radiation therapy. -Start on anastrozole or letrozole post-radiation for 5-10 yrs.  Endometrial Cancer: History of endometrial cancer.  Genetic testing negative. Continue FU with Dr Pricilla Holm as scheduled.  Coagulation Disorder: History of extensive bruising and bleeding post-surgery. -Monitor closely during breast cancer treatment due to history of extensive bleeding. -Discussed this with our NN team  General Health Maintenance -Continue current medications for diabetes, hypertension, and high cholesterol. -Monitor bone density while on anastrozole or letrozole due to potential negative effects on bone density.

## 2023-12-25 NOTE — Therapy (Signed)
 OUTPATIENT PHYSICAL THERAPY BREAST CANCER BASELINE EVALUATION   Patient Name: Morgan Frye MRN: [id number] DOB:1951-10-09, 73 y.o., female Today's Date: 12/25/2023  END OF SESSION:  PT End of Session - 12/25/23 1120     Visit Number 1    Number of Visits 2    Date for PT Re-Evaluation 02/19/24    PT Start Time 1000    PT Stop Time 1030   Also saw pt from 1040-1051 for a total of 41 min   PT Time Calculation (min) 30 min    Activity Tolerance Patient tolerated treatment well    Behavior During Therapy WFL for tasks assessed/performed             Past Medical History:  Diagnosis Date   Arthritis    osteoarthritis- knees, hips, rt. hip bursitis   Breast cancer (HCC)    Diabetes mellitus type 2 in obese    Hemangioma of liver    very small- evaluated and considered stable- no further problems or follow up in many years   History of kidney stones    x1 lithotripsy-has others not a bother at this time   History of palpitations    during menopause, on metoprolol. Saw cardiologist   Hypertension    Obesity (BMI 30-39.9)    Transfusion history    '1981- s/p childbirth   Past Surgical History:  Procedure Laterality Date   ABDOMINAL HYSTERECTOMY     CATARACT EXTRACTION, BILATERAL Bilateral    5 yrs ago   DILATION AND CURETTAGE OF UTERUS  2010   perimenopause, caused bleeding, had polyp removed   LITHOTRIPSY     ROBOTIC ASSISTED TOTAL HYSTERECTOMY WITH BILATERAL SALPINGO OOPHERECTOMY N/A 10/03/2022   Procedure: XI ROBOTIC ASSISTED TOTAL HYSTERECTOMY WITH BILATERAL SALPINGO OOPHORECTOMY;  Surgeon: Carver Fila, MD;  Location: [facility];  Service: Gynecology;  Laterality: N/A;   SENTINEL NODE BIOPSY N/A 10/03/2022   Procedure: SENTINEL NODE BIOPSY;  Surgeon: Carver Fila, MD;  Location: [facility];  Service: Gynecology;  Laterality: N/A;   TONSILLECTOMY     child   TOTAL KNEE ARTHROPLASTY Right 05/24/2014   Procedure: RIGHT TOTAL KNEE ARTHROPLASTY;  Surgeon: Loanne Drilling, MD;  Location: [facility];  Service: Orthopedics;  Laterality: Right;   TOTAL KNEE ARTHROPLASTY Left 05/30/2015   Procedure: LEFT TOTAL KNEE ARTHROPLASTY;  Surgeon: Ollen Gross, MD;  Location: [facility];  Service: Orthopedics;  Laterality: Left;   Patient Active Problem List   Diagnosis Date Noted   Malignant 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, estrogen receptor positive (HCC) 12/23/2023   Genetic testing 11/27/2022   Endometrial cancer (HCC) 09/07/2022   Type 2 diabetes mellitus with obesity (HCC) 09/07/2022   Obesity (BMI 30-39.9) 09/07/2022   Palpitation 03/25/2015   Hyperlipidemia 05/28/2014   Essential hypertension, benign 05/28/2014   Constipation 05/28/2014   Hyponatremia 05/26/2014   Postoperative anemia due to acute blood loss 05/26/2014   OA (osteoarthritis) of knee 05/24/2014   REFERRING PROVIDER: Dr. Harriette Bouillon  REFERRING DIAG: Left breast cancer  THERAPY DIAG:  Malignant neoplasm of upper-outer quadrant of left breast in female, estrogen receptor negative (HCC)  Abnormal posture  Rationale for Evaluation and Treatment: Rehabilitation  ONSET DATE: 12/17/2023  SUBJECTIVE:  SUBJECTIVE STATEMENT: Patient reports she is here today to be seen by her medical team for her newly diagnosed left breast cancer.   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 It measures 3.5 cm and is located in the upper outer quadrant. It is ER/PR positive and HER2 negative with a Ki67 of 20%. She has a history of endometrial cancer 09/04/2022 treated surgically with a hysterectomy and 4 lymph nodes removed. She has a mild risk of bilateral lower extremity lymphedema.  PATIENT GOALS:   reduce lymphedema risk and  learn post op HEP.   PAIN:  Are you having pain? No  PRECAUTIONS: Active CA Other: Bilateral LE lymphedema risk  RED FLAGS: None   HAND DOMINANCE: left  WEIGHT BEARING RESTRICTIONS: No  FALLS:  Has patient fallen in last 6 months? Yes. Number of falls 1 - fell on a rug that was pulled under her by an automatic door at her doctor's office  LIVING ENVIRONMENT: Patient lives with: her husband and 2 [place] bulldogs Lives in: House/apartment Has following equipment at home: None  OCCUPATION: Retired  LEISURE: She does not exercise  PRIOR LEVEL OF FUNCTION: Independent   OBJECTIVE: Note: Objective measures were completed at Evaluation unless otherwise noted.  COGNITION: Overall cognitive status: Within functional limits for tasks assessed    POSTURE:  Forward head and rounded shoulders posture  UPPER EXTREMITY AROM/PROM:  A/PROM RIGHT   eval   Shoulder extension 52  Shoulder flexion 147  Shoulder abduction 149  Shoulder internal rotation 77  Shoulder external rotation 77    (Blank rows = not tested)  A/PROM LEFT   eval  Shoulder extension 52  Shoulder flexion 137  Shoulder abduction 151  Shoulder internal rotation 67  Shoulder external rotation 88    (Blank rows = not tested)  CERVICAL AROM: All within normal limits   UPPER EXTREMITY STRENGTH: WNL  LYMPHEDEMA ASSESSMENTS (in cm):   LANDMARK RIGHT   eval  10 cm proximal to olecranon process 32.6  Olecranon process 26.5  10 cm proximal to ulnar styloid process 15.4  Just proximal to ulnar styloid process 17.5  Across hand at thumb web space 19.5  At base of 2nd digit 7  (Blank rows = not tested)  LANDMARK LEFT   eval  10 cm proximal to olecranon process 33.2  Olecranon process 26.8  10 cm proximal to ulnar styloid process 23.8  Just proximal to ulnar styloid process 16.9  Across hand at thumb web space 19.8  At base of 2nd digit 6.8  (Blank rows = not tested)  L-DEX LYMPHEDEMA  SCREENING:  The patient was assessed using the L-Dex machine today to produce a lymphedema index baseline score. The patient will be reassessed on a regular basis (typically every 3 months) to obtain new L-Dex scores. If the score is > 6.5 points away from his/her baseline score indicating onset of subclinical lymphedema, it will be recommended to wear a compression garment for 4 weeks, 12 hours per day and then be reassessed. If the score continues to be > 6.5 points from baseline at reassessment, we will initiate lymphedema treatment. Assessing in this manner has a 95% rate of preventing clinically significant lymphedema.   L-DEX FLOWSHEETS - 12/25/23 1100       L-DEX LYMPHEDEMA SCREENING   Measurement Type Unilateral    L-DEX MEASUREMENT EXTREMITY Upper Extremity    POSITION  Standing    DOMINANT SIDE Left  At Risk Side Left    BASELINE SCORE (UNILATERAL) -3.5             QUICK DASH SURVEY:  Neldon Mc - 12/25/23 0001     Open a tight or new jar No difficulty    Do heavy household chores (wash walls, wash floors) No difficulty    Carry a shopping bag or briefcase No difficulty    Wash your back No difficulty    Use a knife to cut food No difficulty    Recreational activities in which you take some force or impact through your arm, shoulder, or hand (golf, hammering, tennis) No difficulty    During the past week, to what extent has your arm, shoulder or hand problem interfered with your normal social activities with family, friends, neighbors, or groups? Not at all    During the past week, to what extent has your arm, shoulder or hand problem limited your work or other regular daily activities Not at all    Arm, shoulder, or hand pain. None    Tingling (pins and needles) in your arm, shoulder, or hand None    Difficulty Sleeping No difficulty    DASH Score 0 %              PATIENT EDUCATION:  Education details: Time spent educating patient on aspects of self-care to  maximize post op recovery. Patient was educated on where and how to get a post op compression bra to use to reduce post op edema. Patient was also educated on the use of SOZO screenings and surveillance principles for early identification of lymphedema onset. She was instructed to use the post op pillow in the axilla for pressure and pain relief. Patient educated on lymphedema risk reduction and post op shoulder/posture HEP. Person educated: Patient Education method: Explanation, Demonstration, Handout Education comprehension: Patient verbalized understanding and returned demonstration  HOME EXERCISE PROGRAM: Patient was instructed today in a home exercise program today for post op shoulder range of motion. These included active assist shoulder flexion in sitting, scapular retraction, wall walking with shoulder abduction, and hands behind head external rotation.  She was encouraged to do these twice a day, holding 3 seconds and repeating 5 times when permitted by her physician.   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 It measures 3.5 cm and is located in the upper outer quadrant. It is ER/PR positive and HER2 negative with a Ki67 of 20%. She has a history of endometrial cancer 09/04/2022 treated surgically with a hysterectomy and 4 lymph nodes removed. She has a mild risk of bilateral lower extremity lymphedema.Her multidisciplinary medical team met prior to her assessments to determine a recommended treatment plan. She is planning to have a left lumpectomy and sentinel node biopsy followed by possible Oncotype testing, radiation, and anti-estrogen therapy. She will benefit from a post op PT reassessment to determine needs and from L-Dex screens every 3 months for 2 years to detect subclinical lymphedema.  Pt will benefit from skilled therapeutic intervention to improve on the following deficits: Decreased  knowledge of precautions, impaired UE functional use, pain, decreased ROM, postural dysfunction.   PT treatment/interventions: ADL/self-care home management, pt/family education, therapeutic exercise  REHAB POTENTIAL: Excellent  CLINICAL DECISION MAKING: Stable/uncomplicated  EVALUATION COMPLEXITY: Low   GOALS: Goals reviewed with patient? YES  LONG TERM GOALS: (STG=LTG)    Name Target Date Goal status  1 Pt will be able  to verbalize understanding of pertinent lymphedema risk reduction practices relevant to her dx specifically related to skin care.  Baseline:  No knowledge 12/25/2023 Achieved at eval  2 Pt will be able to return demo and/or verbalize understanding of the post op HEP related to regaining shoulder ROM. Baseline:  No knowledge 12/25/2023 Achieved at eval  3 Pt will be able to verbalize understanding of the importance of viewing the post op After Breast CA Class video for further lymphedema risk reduction education and therapeutic exercise.  Baseline:  No knowledge 12/25/2023 Achieved at eval  4 Pt will demo she has regained full shoulder ROM and function post operatively compared to baselines.  Baseline: See objective measurements taken today. 02/19/2024     PLAN:  PT FREQUENCY/DURATION: EVAL and 1 follow up appointment.   PLAN FOR NEXT SESSION: will reassess 3-4 weeks post op to determine needs.   Patient will follow up at outpatient cancer rehab 3-4 weeks following surgery.  If the patient requires physical therapy at that time, a specific plan will be dictated and sent to the referring physician for approval. The patient was educated today on appropriate basic range of motion exercises to begin post operatively and the importance of viewing the After Breast Cancer class video following surgery.  Patient was educated today on lymphedema risk reduction practices as it pertains to recommendations that will benefit the patient immediately following surgery.  She verbalized  good understanding.    Physical Therapy Information for After Breast Cancer Surgery/Treatment:  Lymphedema is a swelling condition that you may be at risk for in your arm if you have lymph nodes removed from the armpit area.  After a sentinel node biopsy, the risk is approximately 5-9% and is higher after an axillary node dissection.  There is treatment available for this condition and it is not life-threatening.  Contact your physician or physical therapist with concerns. You may begin the 4 shoulder/posture exercises (see additional sheet) when permitted by your physician (typically a week after surgery).  If you have drains, you may need to wait until those are removed before beginning range of motion exercises.  A general recommendation is to not lift your arms above shoulder height until drains are removed.  These exercises should be done to your tolerance and gently.  This is not a "no pain/no gain" type of recovery so listen to your body and stretch into the range of motion that you can tolerate, stopping if you have pain.  If you are having immediate reconstruction, ask your plastic surgeon about doing exercises as he or she may want you to wait. We encourage you to attend the free one time ABC (After Breast Cancer) class offered by [facility].  You will learn information related to lymphedema risk, prevention and treatment and additional exercises to regain mobility following surgery.  You can call [phone] for more information.  This is offered the 1st and 3rd Monday of each month.  You only attend the class one time. While undergoing any medical procedure or treatment, try to avoid blood pressure being taken or needle sticks from occurring on the arm on the side of cancer.   This recommendation begins after surgery and continues for the rest of your life.  This may help reduce your risk of getting lymphedema (swelling in your arm). An excellent resource for those  seeking information on lymphedema is the National Lymphedema Network's web site. It can be accessed at www.lymphnet.org If you notice  swelling in your hand, arm or breast at any time following surgery (even if it is many years from now), please contact your doctor or physical therapist to discuss this.  Lymphedema can be treated at any time but it is easier for you if it is treated early on.  If you feel like your shoulder motion is not returning to normal in a reasonable amount of time, please contact your surgeon or physical therapist.  [facility] [phone]. [address], [place] [address]  ABC CLASS After Breast Cancer Class  After Breast Cancer Class is a specially designed exercise class video to assist you in a safe recover after having breast cancer surgery.  In this video you will learn how to get back to full function whether your drains were just removed or if you had surgery a month ago. The video can be viewed on this page: [email or web address].boyd-meyer.org/ or on YouTube here: [email or web address].  Class Goals  Understand specific stretches to improve the flexibility of you chest and shoulder. Learn ways to safely strengthen your upper body and improve your posture. Understand the warning signs of infection and why you may be at risk for an arm infection. Learn about Lymphedema and prevention.  ** You do not need to view this video until after surgery.  Drains should be removed to participate in the recommended exercises on the video.  Patient was instructed today in a home exercise program today for post op shoulder range of motion. These included active assist shoulder flexion in sitting, scapular retraction, wall walking with shoulder abduction, and hands behind head external rotation.  She was encouraged to do these twice a day, holding 3 seconds and repeating 5 times  when permitted by her physician.  Bethann Punches, [place] 12/25/23 11:31 AM

## 2023-12-25 NOTE — Progress Notes (Signed)
 [facility] Clinical Social Work  Initial Assessment   Morgan Frye is a 73 y.o. year old female presenting alone. Clinical Social Work was referred by  [facility]  for assessment of psychosocial needs.   SDOH (Social Determinants of Health) assessments performed: Yes SDOH Interventions    Flowsheet Row Clinical Support from 12/25/2023 in [facility] Clinical Support from 09/13/2022 in [facility] [place]  SDOH Interventions    Food Insecurity Interventions Intervention Not Indicated Intervention Not Indicated  Housing Interventions Intervention Not Indicated Intervention Not Indicated  Transportation Interventions Intervention Not Indicated Intervention Not Indicated  Utilities Interventions Intervention Not Indicated --  Financial Strain Interventions -- Intervention Not Indicated       SDOH Screenings   Food Insecurity: No Food Insecurity (12/25/2023)  Housing: Low Risk  (12/25/2023)  Transportation Needs: No Transportation Needs (12/25/2023)  Utilities: Not At Risk (12/25/2023)  Depression (PHQ2-9): Low Risk  (12/25/2023)  Financial Resource Strain: Low Risk  (09/13/2022)  Tobacco Use: Low Risk  (12/25/2023)   Received from [facility]     Distress Screen completed: No     No data to display            Family/Social Information:  Housing Arrangement: patient lives with husband . Currently in own home. On waitlist for [facility] Family members/support persons in your life? Family and Friends Transportation concerns: no  Employment: Retired.  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 yes Patient reported stressors: Adjusting to my illness Hopes and/or priorities: hopes treatment goes smoothly and quickly as it did when she had endometrial cancer Current coping skills/ strengths: Ability for insight , Communication skills , Motivation for treatment/growth , and Supportive family/friends     SUMMARY: Current SDOH Barriers:  No major barriers noted today  Clinical Social Work Clinical Goal(s):  No clinical social work goals at this time  Interventions: Discussed common feeling and emotions when being diagnosed with cancer, and the importance of support during treatment Informed patient of the support team roles and support services at [facility] Provided CSW contact information and encouraged patient to call with any questions or concerns   Follow Up Plan: Patient will contact CSW with any support or resource needs Patient verbalizes understanding of plan: Yes    Sukanya Goldblatt E Tamme Mozingo, LCSW Clinical Social Worker [facility]

## 2023-12-27 ENCOUNTER — Other Ambulatory Visit: Payer: Self-pay

## 2023-12-27 ENCOUNTER — Encounter (HOSPITAL_BASED_OUTPATIENT_CLINIC_OR_DEPARTMENT_OTHER): Payer: Self-pay | Admitting: Surgery

## 2023-12-30 ENCOUNTER — Encounter: Payer: Self-pay | Admitting: Gynecologic Oncology

## 2023-12-31 ENCOUNTER — Encounter: Payer: Self-pay | Admitting: *Deleted

## 2023-12-31 ENCOUNTER — Telehealth: Payer: Self-pay | Admitting: *Deleted

## 2023-12-31 NOTE — Telephone Encounter (Signed)
 Spoke to pt concerning [facility] from 12/25/23. Denies questions or concerns regarding dx or treatment care plan. Encourage pt to call with needs. Received verbal understanding.

## 2024-01-01 ENCOUNTER — Telehealth: Payer: Self-pay | Admitting: Hematology and Oncology

## 2024-01-01 ENCOUNTER — Encounter (HOSPITAL_BASED_OUTPATIENT_CLINIC_OR_DEPARTMENT_OTHER)
Admission: RE | Admit: 2024-01-01 | Discharge: 2024-01-01 | Disposition: A | Discharge status: Home / Self Care | Source: Ambulatory Visit | Attending: Surgery | Admitting: Surgery

## 2024-01-01 ENCOUNTER — Other Ambulatory Visit: Payer: Self-pay

## 2024-01-01 DIAGNOSIS — R9431 Abnormal electrocardiogram [ECG] [EKG]: Secondary | ICD-10-CM | POA: Insufficient documentation

## 2024-01-01 DIAGNOSIS — I10 Essential (primary) hypertension: Secondary | ICD-10-CM | POA: Insufficient documentation

## 2024-01-01 DIAGNOSIS — Z01810 Encounter for preprocedural cardiovascular examination: Secondary | ICD-10-CM | POA: Diagnosis not present

## 2024-01-01 DIAGNOSIS — C50412 Malignant neoplasm of upper-outer quadrant of left female breast: Secondary | ICD-10-CM | POA: Diagnosis not present

## 2024-01-01 DIAGNOSIS — Z01818 Encounter for other preprocedural examination: Secondary | ICD-10-CM | POA: Diagnosis present

## 2024-01-01 MED ORDER — CHLORHEXIDINE GLUCONATE CLOTH 2 % EX PADS: 6.0000 | MEDICATED_PAD | Freq: Once | CUTANEOUS | Status: DC

## 2024-01-01 MED ORDER — CHLORHEXIDINE GLUCONATE CLOTH 2 % EX PADS
6.0000 | MEDICATED_PAD | Freq: Once | CUTANEOUS | Status: DC
Start: 2024-01-01 — End: 2024-01-02

## 2024-01-01 NOTE — Telephone Encounter (Signed)
 Patient is aware of scheduled appointment times/dates

## 2024-01-01 NOTE — Progress Notes (Signed)
° ° ° ° °  Enhanced Recovery after Surgery Enhanced Recovery after Surgery is a protocol used to improve the stress on your body and your recovery after surgery.  Patient Instructions  The night before surgery:  No food after midnight. ONLY clear liquids after midnight  The day of surgery (if you do NOT have diabetes):  Drink ONE (1) Pre-Surgery Clear Ensure as directed.   This drink was given to you during your hospital  pre-op appointment visit. The pre-op nurse will instruct you on the time to drink the  Pre-Surgery Ensure depending on your surgery time. Finish the drink at the designated time by the pre-op nurse.  Nothing else to drink after completing the  Pre-Surgery Clear Ensure.  The day of surgery (if you have diabetes): Drink ONE (1) Gatorade 2 (G2) as directed. This drink was given to you during your hospital  pre-op appointment visit.  The pre-op nurse will instruct you on the time to drink the   Gatorade 2 (G2) depending on your surgery time. Color of the Gatorade may vary. Red is not allowed. Nothing else to drink after completing the  Gatorade 2 (G2).         If you have questions, please contact your surgeon's office.  Surgical soap given with written instructions.  Pt verbalized understandings. 

## 2024-01-02 ENCOUNTER — Ambulatory Visit (HOSPITAL_BASED_OUTPATIENT_CLINIC_OR_DEPARTMENT_OTHER): Admitting: Anesthesiology

## 2024-01-02 ENCOUNTER — Encounter (HOSPITAL_BASED_OUTPATIENT_CLINIC_OR_DEPARTMENT_OTHER)
Admission: RE | Disposition: A | Discharge status: Home / Self Care | Payer: Self-pay | Source: Home / Self Care | Attending: Surgery

## 2024-01-02 ENCOUNTER — Other Ambulatory Visit: Payer: Self-pay

## 2024-01-02 ENCOUNTER — Ambulatory Visit (HOSPITAL_BASED_OUTPATIENT_CLINIC_OR_DEPARTMENT_OTHER)
Admission: RE | Admit: 2024-01-02 | Discharge: 2024-01-02 | Disposition: A | Discharge status: Home / Self Care | Payer: Medicare PPO | Attending: Surgery | Admitting: Surgery

## 2024-01-02 ENCOUNTER — Encounter (HOSPITAL_BASED_OUTPATIENT_CLINIC_OR_DEPARTMENT_OTHER): Payer: Self-pay | Admitting: Surgery

## 2024-01-02 DIAGNOSIS — G8918 Other acute postprocedural pain: Secondary | ICD-10-CM | POA: Diagnosis not present

## 2024-01-02 DIAGNOSIS — C50512 Malignant neoplasm of lower-outer quadrant of left female breast: Secondary | ICD-10-CM | POA: Diagnosis not present

## 2024-01-02 DIAGNOSIS — E119 Type 2 diabetes mellitus without complications: Secondary | ICD-10-CM | POA: Diagnosis not present

## 2024-01-02 DIAGNOSIS — Z7984 Long term (current) use of oral hypoglycemic drugs: Secondary | ICD-10-CM | POA: Diagnosis not present

## 2024-01-02 DIAGNOSIS — C50912 Malignant neoplasm of unspecified site of left female breast: Secondary | ICD-10-CM | POA: Diagnosis not present

## 2024-01-02 DIAGNOSIS — Z1721 Progesterone receptor positive status: Secondary | ICD-10-CM | POA: Diagnosis not present

## 2024-01-02 DIAGNOSIS — I10 Essential (primary) hypertension: Secondary | ICD-10-CM | POA: Diagnosis not present

## 2024-01-02 DIAGNOSIS — Z170 Estrogen receptor positive status [ER+]: Secondary | ICD-10-CM | POA: Insufficient documentation

## 2024-01-02 DIAGNOSIS — N6012 Diffuse cystic mastopathy of left breast: Secondary | ICD-10-CM | POA: Diagnosis not present

## 2024-01-02 DIAGNOSIS — Z1732 Human epidermal growth factor receptor 2 negative status: Secondary | ICD-10-CM | POA: Diagnosis not present

## 2024-01-02 HISTORY — PX: BREAST LUMPECTOMY WITH RADIOACTIVE SEED AND SENTINEL LYMPH NODE BIOPSY: SHX6550

## 2024-01-02 HISTORY — DX: Disease of blood and blood-forming organs, unspecified: D75.9

## 2024-01-02 LAB — GLUCOSE, CAPILLARY
Glucose-Capillary: 149 mg/dL — ABNORMAL HIGH (ref 70–99)
Glucose-Capillary: 118 mg/dL — ABNORMAL HIGH (ref 70–99)

## 2024-01-02 SURGERY — BREAST LUMPECTOMY WITH RADIOACTIVE SEED AND SENTINEL LYMPH NODE BIOPSY
Anesthesia: Regional | Site: Breast | Laterality: Left

## 2024-01-02 MED ORDER — BUPIVACAINE-EPINEPHRINE (PF) 0.25% -1:200000 IJ SOLN
INTRAMUSCULAR | Status: AC
  Filled 2024-01-02: qty 30

## 2024-01-02 MED ORDER — FENTANYL CITRATE (PF) 100 MCG/2ML IJ SOLN
INTRAMUSCULAR | Status: AC
  Filled 2024-01-02: qty 2

## 2024-01-02 MED ORDER — FENTANYL CITRATE (PF) 100 MCG/2ML IJ SOLN
100.0000 ug | Freq: Once | INTRAMUSCULAR | Status: AC
  Administered 2024-01-02: 50 ug via INTRAVENOUS

## 2024-01-02 MED ORDER — EPHEDRINE SULFATE (PRESSORS) 50 MG/ML IJ SOLN
INTRAMUSCULAR | Status: DC | PRN
  Administered 2024-01-02: 5 mg via INTRAVENOUS
  Administered 2024-01-02: 10 mg via INTRAVENOUS

## 2024-01-02 MED ORDER — CLINDAMYCIN PHOSPHATE 900 MG/50ML IV SOLN
900.0000 mg | INTRAVENOUS | Status: AC
  Administered 2024-01-02: 900 mg via INTRAVENOUS

## 2024-01-02 MED ORDER — DROPERIDOL 2.5 MG/ML IJ SOLN: 0.6250 mg | Freq: Once | INTRAMUSCULAR | Status: DC | PRN

## 2024-01-02 MED ORDER — MIDAZOLAM HCL 2 MG/2ML IJ SOLN
2.0000 mg | Freq: Once | INTRAMUSCULAR | Status: AC
  Administered 2024-01-02: 1 mg via INTRAVENOUS

## 2024-01-02 MED ORDER — PROPOFOL 10 MG/ML IV BOLUS
INTRAVENOUS | Status: DC | PRN
  Administered 2024-01-02: 150 mg via INTRAVENOUS

## 2024-01-02 MED ORDER — CLINDAMYCIN PHOSPHATE 900 MG/50ML IV SOLN
INTRAVENOUS | Status: AC
  Filled 2024-01-02: qty 50

## 2024-01-02 MED ORDER — FENTANYL CITRATE (PF) 100 MCG/2ML IJ SOLN: 25.0000 ug | INTRAMUSCULAR | Status: DC | PRN

## 2024-01-02 MED ORDER — MAGTRACE LYMPHATIC TRACER
INTRAMUSCULAR | Status: DC | PRN
  Administered 2024-01-02: 2 mL via INTRAMUSCULAR

## 2024-01-02 MED ORDER — DEXAMETHASONE SODIUM PHOSPHATE 10 MG/ML IJ SOLN
INTRAMUSCULAR | Status: AC
  Filled 2024-01-02: qty 1

## 2024-01-02 MED ORDER — BUPIVACAINE-EPINEPHRINE (PF) 0.25% -1:200000 IJ SOLN
INTRAMUSCULAR | Status: DC | PRN
  Administered 2024-01-02: 16 mL

## 2024-01-02 MED ORDER — OXYCODONE HCL 5 MG PO TABS: 5.0000 mg | ORAL_TABLET | Freq: Four times a day (QID) | ORAL | 0 refills | Status: DC | PRN

## 2024-01-02 MED ORDER — PROPOFOL 10 MG/ML IV BOLUS
INTRAVENOUS | Status: AC
  Filled 2024-01-02: qty 20

## 2024-01-02 MED ORDER — GABAPENTIN 300 MG PO CAPS
300.0000 mg | ORAL_CAPSULE | ORAL | Status: AC
Start: 2024-01-02 — End: 2024-01-02
  Administered 2024-01-02: 300 mg via ORAL

## 2024-01-02 MED ORDER — ONDANSETRON HCL 4 MG/2ML IJ SOLN
INTRAMUSCULAR | Status: AC
  Filled 2024-01-02: qty 2

## 2024-01-02 MED ORDER — DEXAMETHASONE SODIUM PHOSPHATE 4 MG/ML IJ SOLN
INTRAMUSCULAR | Status: DC | PRN
  Administered 2024-01-02: 4 mg via INTRAVENOUS

## 2024-01-02 MED ORDER — BUPIVACAINE HCL (PF) 0.5 % IJ SOLN
INTRAMUSCULAR | Status: DC | PRN
Start: 2024-01-02 — End: 2024-01-02
  Administered 2024-01-02: 20 mL

## 2024-01-02 MED ORDER — OXYCODONE HCL 5 MG PO TABS: 5.0000 mg | ORAL_TABLET | Freq: Once | ORAL | Status: DC | PRN

## 2024-01-02 MED ORDER — FENTANYL CITRATE (PF) 100 MCG/2ML IJ SOLN
INTRAMUSCULAR | Status: DC | PRN
  Administered 2024-01-02 (×2): 25 ug via INTRAVENOUS
  Administered 2024-01-02: 50 ug via INTRAVENOUS

## 2024-01-02 MED ORDER — OXYCODONE HCL 5 MG/5ML PO SOLN: 5.0000 mg | Freq: Once | ORAL | Status: DC | PRN

## 2024-01-02 MED ORDER — FENTANYL CITRATE (PF) 100 MCG/2ML IJ SOLN
INTRAMUSCULAR | Status: AC
Start: 2024-01-02 — End: ?
  Filled 2024-01-02: qty 2

## 2024-01-02 MED ORDER — LACTATED RINGERS IV SOLN: INTRAVENOUS | Status: DC

## 2024-01-02 MED ORDER — MIDAZOLAM HCL 2 MG/2ML IJ SOLN
INTRAMUSCULAR | Status: AC
  Filled 2024-01-02: qty 2

## 2024-01-02 MED ORDER — GABAPENTIN 300 MG PO CAPS
ORAL_CAPSULE | ORAL | Status: AC
  Filled 2024-01-02: qty 1

## 2024-01-02 MED ORDER — LIDOCAINE 2% (20 MG/ML) 5 ML SYRINGE
INTRAMUSCULAR | Status: AC
  Filled 2024-01-02: qty 5

## 2024-01-02 MED ORDER — BUPIVACAINE LIPOSOME 1.3 % IJ SUSP
INTRAMUSCULAR | Status: DC | PRN
  Administered 2024-01-02: 10 mL

## 2024-01-02 MED ORDER — 0.9 % SODIUM CHLORIDE (POUR BTL) OPTIME
TOPICAL | Status: DC | PRN
  Administered 2024-01-02: 500 mL

## 2024-01-02 MED ORDER — ONDANSETRON HCL 4 MG/2ML IJ SOLN
INTRAMUSCULAR | Status: DC | PRN
  Administered 2024-01-02: 4 mg via INTRAVENOUS

## 2024-01-02 SURGICAL SUPPLY — 47 items
APPLIER CLIP 9.375 MED OPEN (MISCELLANEOUS) ×1 IMPLANT
BINDER BREAST 3XL (GAUZE/BANDAGES/DRESSINGS) IMPLANT
BINDER BREAST XLRG (GAUZE/BANDAGES/DRESSINGS) IMPLANT
BINDER BREAST XXLRG (GAUZE/BANDAGES/DRESSINGS) IMPLANT
BIOPATCH RED 1 DISK 7.0 (GAUZE/BANDAGES/DRESSINGS) IMPLANT
BLADE SURG 15 STRL LF DISP TIS (BLADE) ×1 IMPLANT
CANISTER SUC SOCK COL 7IN (MISCELLANEOUS) IMPLANT
CANISTER SUCT 1200ML W/VALVE (MISCELLANEOUS) ×1 IMPLANT
CHLORAPREP W/TINT 26 (MISCELLANEOUS) ×1 IMPLANT
CLIP APPLIE 9.375 MED OPEN (MISCELLANEOUS) ×1 IMPLANT
COVER BACK TABLE 60X90IN (DRAPES) ×1 IMPLANT
COVER MAYO STAND STRL (DRAPES) ×1 IMPLANT
COVER PROBE CYLINDRICAL 5X96 (MISCELLANEOUS) ×1 IMPLANT
DERMABOND ADVANCED .7 DNX12 (GAUZE/BANDAGES/DRESSINGS) ×1 IMPLANT
DRAIN CHANNEL 19F RND (DRAIN) IMPLANT
DRAPE LAPAROSCOPIC ABDOMINAL (DRAPES) ×1 IMPLANT
DRAPE UTILITY XL STRL (DRAPES) ×1 IMPLANT
DRSG TEGADERM 2-3/8X2-3/4 SM (GAUZE/BANDAGES/DRESSINGS) IMPLANT
ELECT COATED BLADE 2.86 ST (ELECTRODE) ×1 IMPLANT
ELECT REM PT RETURN 9FT ADLT (ELECTROSURGICAL) ×1 IMPLANT
ELECTRODE REM PT RTRN 9FT ADLT (ELECTROSURGICAL) ×1 IMPLANT
EVACUATOR SILICONE 100CC (DRAIN) IMPLANT
GLOVE BIO SURGEON STRL SZ 6.5 (GLOVE) IMPLANT
GLOVE BIOGEL PI IND STRL 6.5 (GLOVE) IMPLANT
GLOVE BIOGEL PI IND STRL 7.5 (GLOVE) IMPLANT
GLOVE BIOGEL PI IND STRL 8 (GLOVE) ×1 IMPLANT
GLOVE ECLIPSE 8.0 STRL XLNG CF (GLOVE) ×1 IMPLANT
GOWN STRL REUS W/ TWL LRG LVL3 (GOWN DISPOSABLE) ×2 IMPLANT
GOWN STRL REUS W/ TWL XL LVL3 (GOWN DISPOSABLE) ×1 IMPLANT
HEMOSTAT ARISTA ABSORB 3G PWDR (HEMOSTASIS) IMPLANT
KIT MARKER MARGIN INK (KITS) ×1 IMPLANT
NDL HYPO 25X1 1.5 SAFETY (NEEDLE) ×1 IMPLANT
NEEDLE HYPO 25X1 1.5 SAFETY (NEEDLE) ×2 IMPLANT
NS IRRIG 1000ML POUR BTL (IV SOLUTION) ×1 IMPLANT
PACK BASIN DAY SURGERY FS (CUSTOM PROCEDURE TRAY) ×1 IMPLANT
PENCIL SMOKE EVACUATOR (MISCELLANEOUS) ×1 IMPLANT
SLEEVE SCD COMPRESS KNEE MED (STOCKING) ×1 IMPLANT
SPONGE T-LAP 4X18 ~~LOC~~+RFID (SPONGE) ×1 IMPLANT
SUT ETHILON 2 0 FS 18 (SUTURE) IMPLANT
SUT MNCRL AB 4-0 PS2 18 (SUTURE) ×1 IMPLANT
SUT VICRYL 3-0 CR8 SH (SUTURE) ×1 IMPLANT
SYR CONTROL 10ML LL (SYRINGE) ×1 IMPLANT
TOWEL GREEN STERILE FF (TOWEL DISPOSABLE) ×1 IMPLANT
TRACER MAGTRACE VIAL (MISCELLANEOUS) IMPLANT
TRAY FAXITRON CT DISP (TRAY / TRAY PROCEDURE) ×1 IMPLANT
TUBE CONNECTING 20X1/4 (TUBING) ×1 IMPLANT
YANKAUER SUCT BULB TIP NO VENT (SUCTIONS) ×1 IMPLANT

## 2024-01-02 NOTE — Interval H&P Note (Signed)
 History and Physical Interval Note:  01/02/2024 11:02 AM  Morgan Frye  has presented today for surgery, with the diagnosis of BREAST CANCER.  The various methods of treatment have been discussed with the patient and family. After consideration of risks, benefits and other options for treatment, the patient has consented to  Procedure(s) with comments: LEFT BREAST SEED BRACKETED LUMPECTOMY, LEFT SENTINEL LYMPH NODE MAPPING (Left) - PEC BLOCK as a surgical intervention.  The patient's history has been reviewed, patient examined, no change in status, stable for surgery.  I have reviewed the patient's chart and labs.  Questions were answered to the patient's satisfaction.   The procedure has been discussed with the patient. Alternatives to surgery have been discussed with the patient.  Risks of surgery include bleeding,  Infection,  Seroma formation, death,  and the need for further surgery.   The patient understands and wishes to proceed.   [name] A Taneka Espiritu

## 2024-01-02 NOTE — Transfer of Care (Signed)
 Immediate Anesthesia Transfer of Care Note  Patient: Morgan Frye  Procedure(s) Performed: LEFT BREAST SEED BRACKETED LUMPECTOMY, LEFT SENTINEL LYMPH NODE MAPPING (Left: Breast)  Patient Location: PACU  Anesthesia Type:GA combined with regional for post-op pain  Level of Consciousness: sedated  Airway & Oxygen Therapy: Patient Spontanous Breathing and Patient connected to face mask oxygen  Post-op Assessment: Report given to RN and Post -op Vital signs reviewed and stable  Post vital signs: Reviewed and stable  Last Vitals:  Vitals Value Taken Time  BP 118/69 01/02/24 1255  Temp    Pulse 77 01/02/24 1258  Resp 16 01/02/24 1258  SpO2 100 % 01/02/24 1258  Vitals shown include unfiled device data.  Last Pain:  Vitals:   01/02/24 0932  TempSrc: Oral  PainSc: 0-No pain      Patients Stated Pain Goal: 4 (01/02/24 0932)  Complications: No notable events documented.

## 2024-01-02 NOTE — Anesthesia Preprocedure Evaluation (Signed)
 Anesthesia Evaluation  Patient identified by MRN, date of birth, ID band Patient awake    Reviewed: Allergy & Precautions, NPO status , Patient's Chart, lab work & pertinent test results  Airway Mallampati: II  TM Distance: >3 FB Neck ROM: Full    Dental no notable dental hx.    Pulmonary neg pulmonary ROS   Pulmonary exam normal        Cardiovascular hypertension, Pt. on medications and Pt. on home beta blockers  Rhythm:Regular Rate:Normal     Neuro/Psych negative neurological ROS  negative psych ROS   GI/Hepatic negative GI ROS, Neg liver ROS,,,  Endo/Other  diabetes, Type 2, Oral Hypoglycemic Agents    Renal/GU negative Renal ROS  negative genitourinary   Musculoskeletal  (+) Arthritis , Osteoarthritis,  Breast Ca   Abdominal Normal abdominal exam  (+)   Peds  Hematology  (+) Blood dyscrasia, anemia   Anesthesia Other Findings   Reproductive/Obstetrics                             Anesthesia Physical Anesthesia Plan  ASA: 2  Anesthesia Plan: General and Regional   Post-op Pain Management: Regional block* and Gabapentin PO (pre-op)*   Induction: Intravenous  PONV Risk Score and Plan: 3 and Ondansetron, Dexamethasone and Treatment may vary due to age or medical condition  Airway Management Planned: Mask and LMA  Additional Equipment: None  Intra-op Plan:   Post-operative Plan: Extubation in OR  Informed Consent: I have reviewed the patients History and Physical, chart, labs and discussed the procedure including the risks, benefits and alternatives for the proposed anesthesia with the patient or authorized representative who has indicated his/her understanding and acceptance.     Dental advisory given  Plan Discussed with: CRNA  Anesthesia Plan Comments:        Anesthesia Quick Evaluation

## 2024-01-02 NOTE — Op Note (Signed)
 Preoperative diagnosis: Stage Ia left breast cancer lower outer quadrant ER positive  Postoperative diagnosis: Same  Procedure: Bracketed left breast seed localized lumpectomy and left axillary sentinel lymph node mapping with injection of mag trace  Surgeon: Harriette Bouillon, MD  Anesthesia: LMA with 0.25% Marcaine with epinephrine and left pectoral block  Drains: 19 round  Specimen: Left breast tissue with both seeds and 3 clips verified by Faxitron in 1 left axillary sentinel node  IV fluids: Per anesthesia record  EBL: Minimal  Indications for procedure: The patient is a 73 year old female with invasive lobular left breast cancer.  She had an area with 3 foci of disease in the left lower outer quadrant.  Quadrant of her breast.  She was to conserve her breast and presents today for breast conserving surgery after being seen in the multidisciplinary clinic and reviewing all of her options.  Mastectomy reconstruction as well as lumpectomy reviewed.  Local regional recurrence, long-term survival and quality life issues reviewed.  Lymphedema discussions centered around lymph node removal.  We discussed the pros and cons of this especially at her age.  We discussed the need for reexcision especially with the subtype of cancer that she has.  She was to proceed with breast conserving surgery after reviewing all the above.The procedure has been discussed with the patient. Alternatives to surgery have been discussed with the patient.  Risks of surgery include bleeding,  Infection,  Seroma formation, death,  and the need for further surgery.   The patient understands and wishes to proceed.   Description of procedure: The patient was met in the holding area.  All questions were answered.  Of note she underwent seed placement as an outpatient to her left breast.  She underwent a left pectoral block per anesthesia protocol and the left side was marked as correct.  All questions were answered.  She was  brought back to the operating room.  She is placed upon upon the operative room table.  After induction of general anesthesia, 2 cc of mag trace were injected into the left breast.  Massage was then performed.  The left breast was then prepped and draped in a sterile fashion and a second timeout was performed.  Proper patient, site and procedure were verified.  The neoprobe was used and films were available in the OR to guide.  The area of disease was located in the left lower outer quadrant.  A curvilinear incision was made over this with the help of the neoprobe.  There were 2 seeds and 3 clips.  We excised widely around both seeds.  This was very generous resection.  Margins appeared grossly clear.  The specimen was oriented with ink.  The Faxitron showed both seeds and all 3 clips to be contained in the specimen.  Given the size of the lumpectomy cavity I felt best to place a drain.  This was placed through the inferior portion of the skin flap putting a 19 round drain into the cavity and secured to the skin with 2-0 nylon.  We then closed the wound after ensuring hemostasis with cautery and Arista.  Deep tissue planes were were approximated with 3-0 Vicryl.  4 Monocryl was used to close the skin in a subcuticular fashion.  The mag trace probe was used.  There is mild increased uptake in the left axilla noted.  A transverse incision was made along the inferior mammary hairline the left.  Dissection was carried through the subcutaneous tissues into the left [employer]  contents which were the level 1 contents.  There is only 1 node that took up the tracer weekly and was also mildly enlarged.  There were no other enlarged lymph nodes in the level 1 or level 2 basin.  The long thoracic nerve, thoracodorsal trunk and extra vein were all preserved.  Once this nerve node was removed, there is no other significant spike in activity.  This was then irrigated.  Hemostasis achieved with cautery and Arista.  The lumpectomy  cavity was in close approximation I was able to actually pulled the drain up into the axilla somewhat to help facilitate drainage of both areas.  There is a significant mount dissection in the left axilla due to the difficulty in identifying the node.  We then closed the deep tissue planes with 3-0 Vicryl.  4 Monocryl was used to close the skin in a subcuticular fashion.  Dermabond applied to both sides.  Breast binder placed.  Drain placed to bulb suction.  The patient was awoke extubated taken to recovery in satisfactory condition.  All counts were correct.

## 2024-01-02 NOTE — Anesthesia Postprocedure Evaluation (Signed)
 Anesthesia Post Note  Patient: Morgan Frye  Procedure(s) Performed: LEFT BREAST SEED BRACKETED LUMPECTOMY, LEFT SENTINEL LYMPH NODE MAPPING (Left: Breast)     Patient location during evaluation: PACU Anesthesia Type: General Level of consciousness: awake and alert Pain management: pain level controlled Vital Signs Assessment: post-procedure vital signs reviewed and stable Respiratory status: spontaneous breathing, nonlabored ventilation, respiratory function stable and patient connected to nasal cannula oxygen Cardiovascular status: blood pressure returned to baseline and stable Postop Assessment: no apparent nausea or vomiting Anesthetic complications: no  No notable events documented.  Last Vitals:  Vitals:   01/02/24 1329 01/02/24 1430  BP: 131/75 (!) 142/74  Pulse: 73 72  Resp: 10 14  Temp:  (!) 36.2 C  SpO2: 93% 95%    Last Pain:  Vitals:   01/02/24 1430  TempSrc:   PainSc: 2                  Kennieth Rad

## 2024-01-02 NOTE — Anesthesia Procedure Notes (Signed)
 Procedure Name: LMA Insertion Date/Time: 01/02/2024 11:21 AM  Performed by: Burna Cash, CRNAPre-anesthesia Checklist: Patient identified, Emergency Drugs available, Suction available and Patient being monitored Patient Re-evaluated:Patient Re-evaluated prior to induction Oxygen Delivery Method: Circle system utilized Preoxygenation: Pre-oxygenation with 100% oxygen Induction Type: IV induction Ventilation: Mask ventilation without difficulty LMA: LMA inserted LMA Size: 4.0 Number of attempts: 1 Airway Equipment and Method: Bite block Placement Confirmation: positive ETCO2 Tube secured with: Tape Dental Injury: Teeth and Oropharynx as per pre-operative assessment

## 2024-01-02 NOTE — H&P (Signed)
 History of Present Illness: Morgan Frye is a 73 y.o. female who is seen today as an office consultation for evaluation of Breast Cancer  Pleasant 73 year old female seen today in the [facility] for newly diagnosed left breast cancer. She was noted to have multiple subcentimeter areas of disease left breast upper outer quadrant. Core biopsy showed invasive lobular carcinoma grade 3 ER positive, PR positive, HER2/neu negative with a KI of 25%. 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 Denies any history of breast pain, breast mass or nipple discharge.  Review of Systems: A complete review of systems was obtained from the patient. I have reviewed this information and discussed as appropriate with the patient. See HPI as well for other ROS.    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)  Postoperative anemia due to acute blood loss  Constipation  Tendency toward bleeding easily (CMS-HCC)  Easy bruising  Nervously anxious   Past Surgical History:  Procedure Laterality Date  Right total knee arthroplasty Right 05/24/2014  Left total knee arthroplasty Left 05/30/2015  XI ROBOTIC ASSISTED TOTAL HYSTERECTOMY WITH BILATERAL SALPINGO OOPHORECTOMY Right 10/03/2022    Allergies  Allergen Reactions  Acetaminophen Other (See Comments)  Patient states she had kidney issues after this, had "coke" colored urine  Penicillins Hives and Swelling   Current Outpatient Medications on File Prior  to Visit  Medication Sig Dispense Refill  ACCU-CHEK GUIDE TEST STRIPS test strip 1 strip once daily  atorvastatin (LIPITOR) 20 MG tablet Take 20 mg by mouth once daily  cetirizine (ZYRTEC) 10 MG tablet Take 10 mg by mouth once daily  ciclopirox (LOPROX) 0.77 % cream Apply 1 Application topically as directed APPLY TOPICALLY TO THE AFFECTED AREA TWICE DAILY FOR 20 DAYS AS NEEDED  lancets Apply 1 Application topically once daily  lisinopriL-hydroCHLOROthiazide (ZESTORETIC) 20-12.5 mg tablet Take 1 tablet by mouth once daily  metFORMIN (GLUCOPHAGE-XR) 500 MG XR tablet Take 500 mg by mouth daily with dinner  metoprolol SUCCinate (TOPROL-XL) 50 MG XL tablet Take 50 mg by mouth once daily  multivitamin tablet Take 1 tablet by mouth once daily   No current facility-administered medications on file prior to visit.   Family History  Problem Relation Age of Onset  Diabetes Mother  High blood pressure (Hypertension) Mother  Coronary Artery Disease (Blocked arteries around heart) Father  High blood pressure (Hypertension) Father  Skin cancer Father    Social History   Tobacco Use  Smoking Status Never  Smokeless Tobacco Never    Social History   Socioeconomic History  Marital status: Married  Tobacco Use  Smoking status: Never  Smokeless tobacco: Never  Vaping Use  Vaping status: Never Used  Substance and Sexual Activity  Alcohol use: Never  Drug use: Never   Social Drivers of Corporate investment banker Strain: Low Risk (09/13/2022)  Received from [facility]  Overall Financial Resource Strain (CARDIA)  Difficulty of Paying Living Expenses: Not hard at all  Food Insecurity: No Food Insecurity (09/13/2022)  Received from [facility]  Hunger Vital Sign  Worried About Running Out of Food  in the Last Year: Never true  Ran Out of Food in the Last Year: Never true  Transportation Needs: No Transportation Needs (09/13/2022)  Received from [facility]   Lack of Transportation (Medical): No  Lack of Transportation (Non-Medical): No   Objective:  There were no vitals filed for this visit.  There is no height or weight on file to calculate BMI.  Physical Exam Exam conducted with a chaperone present.  HENT:  Head: Normocephalic.  Cardiovascular:  Rate and Rhythm: Normal rate.  Pulmonary:  Effort: Pulmonary effort is normal.  Chest:  Breasts: Right: Normal. No mass or nipple discharge.  Left: Normal. No mass or nipple discharge.   Comments: Significant bruising left breast Musculoskeletal:  Cervical back: Normal range of motion.  Lymphadenopathy:  Upper Body:  Right upper body: No supraclavicular or axillary adenopathy.  Left upper body: No supraclavicular or axillary adenopathy.  Skin: General: Skin is warm.  Neurological:  General: No focal deficit present.  Mental Status: She is alert.  Psychiatric:  Mood and Affect: Mood normal.     Labs, Imaging and Diagnostic Testing: Mammogram left breast upper outer quadrant focal lesions at about 10 cm from nipple measuring 0.9 cm to 1.0 cm same quadrant invasive lobular carcinoma ER positive, PR positive, HER2/neu negative with a KI of 25%.  Assessment and Plan:   Diagnoses and all orders for this visit:  Malignant 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, estrogen receptor positive (CMS/HHS-HCC)   Reviewed all surgical options today. Discussed breast conserving surgery with sentinel lymph node mapping versus mastectomy with reconstruction. Discussed the role of MRI with the diagnosis of lobular carcinoma. The pros and cons of MRI reviewed today. The rationale for MRI also used today. After discussion of the above if she wishes to forego MRI. She wishes to proceed with breast conserving surgery. Recommend left breast seed localized lumpectomy using a bracketed approach and sentinel lymph node mapping using mag trace. Risk of bleeding, infection, nerve injury, blood  vessel injury, cosmetic deformity, reexcision, numbness, lymphedema, all reviewed today. Long-term survival, local regional recurrence and quality of life issues reviewed with both breast conserving surgery as well as mastectomy.

## 2024-01-02 NOTE — Anesthesia Procedure Notes (Signed)
 Anesthesia Regional Block: Pectoralis block   Pre-Anesthetic Checklist: , timeout performed,  Correct Patient, Correct Site, Correct Laterality,  Correct Procedure, Correct Position, site marked,  Risks and benefits discussed,  Surgical consent,  Pre-op evaluation,  At surgeon's request and post-op pain management  Laterality: Left  Prep: Dura Prep       Needles:  Injection technique: Single-shot  Needle Type: Echogenic Stimulator Needle     Needle Length: 10cm  Needle Gauge: 20     Additional Needles:   Procedures:,,,, ultrasound used (permanent image in chart),,    Narrative:  Start time: 01/02/2024 11:06 AM End time: 01/02/2024 11:10 AM Injection made incrementally with aspirations every 5 mL.  Performed by: Personally  Anesthesiologist: Atilano Median, DO  Additional Notes: Patient identified. Risks/Benefits/Options discussed with patient including but not limited to bleeding, infection, nerve damage, failed block, incomplete pain control. Patient expressed understanding and wished to proceed. All questions were answered. Sterile technique was used throughout the entire procedure. Please see nursing notes for vital signs. Aspirated in 5cc intervals with injection for negative confirmation. Patient was given instructions on fall risk and not to get out of bed. All questions and concerns addressed with instructions to call with any issues or inadequate analgesia.

## 2024-01-02 NOTE — Progress Notes (Signed)
Assisted Dr. Gavin Potters with left, pectoralis, ultrasound guided block. Side rails up, monitors on throughout procedure. See vital signs in flow sheet. Tolerated Procedure well.

## 2024-01-02 NOTE — Discharge Instructions (Addendum)
 [facility] Office Phone Number [phone]  BREAST BIOPSY/ PARTIAL MASTECTOMY: POST OP INSTRUCTIONS  Always review your discharge instruction sheet given to you by the facility where your surgery was performed.  IF YOU HAVE DISABILITY OR FAMILY LEAVE FORMS, YOU MUST BRING THEM TO THE OFFICE FOR PROCESSING.  DO NOT GIVE THEM TO YOUR DOCTOR.  A prescription for pain medication may be given to you upon discharge.  Take your pain medication as prescribed, if needed.  If narcotic pain medicine is not needed, then you may take acetaminophen (Tylenol) or ibuprofen (Advil) as needed. Take your usually prescribed medications unless otherwise directed If you need a refill on your pain medication, please contact your pharmacy.  They will contact our office to request authorization.  Prescriptions will not be filled after 5pm or on week-ends. You should eat very light the first 24 hours after surgery, such as soup, crackers, pudding, etc.  Resume your normal diet the day after surgery. Most patients will experience some swelling and bruising in the breast.  Ice packs and a good support bra will help.  Swelling and bruising can take several days to resolve.  It is common to experience some constipation if taking pain medication after surgery.  Increasing fluid intake and taking a stool softener will usually help or prevent this problem from occurring.  A mild laxative (Milk of Magnesia or Miralax) should be taken according to package directions if there are no bowel movements after 48 hours. Unless discharge instructions indicate otherwise, you may remove your bandages 24-48 hours after surgery, and you may shower at that time.  You may have steri-strips (small skin tapes) in place directly over the incision.  These strips should be left on the skin for 7-10 days.  If your surgeon used skin glue on the incision, you may shower in 24 hours.  The glue will flake off over the next 2-3 weeks.  Any  sutures or staples will be removed at the office during your follow-up visit. ACTIVITIES:  You may resume regular daily activities (gradually increasing) beginning the next day.  Wearing a good support bra or sports bra minimizes pain and swelling.  You may have sexual intercourse when it is comfortable. You may drive when you no longer are taking prescription pain medication, you can comfortably wear a seatbelt, and you can safely maneuver your car and apply brakes. RETURN TO WORK:  ______________________________________________________________________________________ Morgan Frye should see your doctor in the office for a follow-up appointment approximately two weeks after your surgery.  Your doctor's nurse will typically make your follow-up appointment when she calls you with your pathology report.  Expect your pathology report 2-3 business days after your surgery.  You may call to check if you do not hear from [place] after three days. OTHER INSTRUCTIONS: _______________________________________________________________________________________________ _____________________________________________________________________________________________________________________________________ _____________________________________________________________________________________________________________________________________ _____________________________________________________________________________________________________________________________________  WHEN TO CALL YOUR DOCTOR: Fever over 101.0 Nausea and/or vomiting. Extreme swelling or bruising. Continued bleeding from incision. Increased pain, redness, or drainage from the incision.  The clinic staff is available to answer your questions during regular business hours.  Please don't hesitate to call and ask to speak to one of the nurses for clinical concerns.  If you have a medical emergency, go to the nearest emergency room or call 911.  A surgeon from [facility] is always on call at the hospital.  For further questions, please visit [email or web address]     About my Jackson-Pratt Bulb Drain  What is a Jackson-Pratt bulb? A Jackson-Pratt is  a soft, round device used to collect drainage. It is connected to a long, thin drainage catheter, which is held in place by one or two small stiches near your surgical incision site. When the bulb is squeezed, it forms a vacuum, forcing the drainage to empty into the bulb.  Emptying the Jackson-Pratt bulb- To empty the bulb: 1. Release the plug on the top of the bulb. 2. Pour the bulb's contents into a measuring container which your nurse will provide. 3. Record the time emptied and amount of drainage. Empty the drain(s) as often as your     doctor or nurse recommends.  Date                  Time                    Amount (Drain 1)                 Amount (Drain 2)  _____________________________________________________________________  _____________________________________________________________________  _____________________________________________________________________  _____________________________________________________________________  _____________________________________________________________________  _____________________________________________________________________  _____________________________________________________________________  _____________________________________________________________________  Squeezing the Jackson-Pratt Bulb- To squeeze the bulb: 1. Make sure the plug at the top of the bulb is open. 2. Squeeze the bulb tightly in your fist. You will hear air squeezing from the bulb. 3. Replace the plug while the bulb is squeezed. 4. Use a safety pin to attach the bulb to your clothing. This will keep the catheter from     pulling at the bulb insertion site.  When to call your doctor- Call your doctor if: Drain site becomes red, swollen or  hot. You have a fever greater than 101 degrees F. There is oozing at the drain site. Drain falls out (apply a guaze bandage over the drain hole and secure it with tape). Drainage increases daily not related to activity patterns. (You will usually have more drainage when you are active than when you are resting.) Drainage has a bad odor.

## 2024-01-03 ENCOUNTER — Encounter (HOSPITAL_BASED_OUTPATIENT_CLINIC_OR_DEPARTMENT_OTHER): Payer: Self-pay | Admitting: Surgery

## 2024-01-03 DIAGNOSIS — C50912 Malignant neoplasm of unspecified site of left female breast: Secondary | ICD-10-CM | POA: Diagnosis not present

## 2024-01-07 ENCOUNTER — Encounter: Payer: Self-pay | Admitting: Surgery

## 2024-01-07 LAB — SURGICAL PATHOLOGY

## 2024-01-08 ENCOUNTER — Encounter: Payer: Self-pay | Admitting: Obstetrics and Gynecology

## 2024-01-08 ENCOUNTER — Encounter: Payer: Self-pay | Admitting: *Deleted

## 2024-01-08 ENCOUNTER — Telehealth: Payer: Self-pay | Admitting: *Deleted

## 2024-01-08 NOTE — Telephone Encounter (Signed)
Received order for Oncotype Testing per Dr. Chryl Heck. Requisition faxed to pathology and [facility]

## 2024-01-16 DIAGNOSIS — C50812 Malignant neoplasm of overlapping sites of left female breast: Secondary | ICD-10-CM | POA: Diagnosis not present

## 2024-01-16 DIAGNOSIS — Z170 Estrogen receptor positive status [ER+]: Secondary | ICD-10-CM | POA: Diagnosis not present

## 2024-01-17 ENCOUNTER — Telehealth: Payer: Self-pay | Admitting: *Deleted

## 2024-01-17 ENCOUNTER — Encounter: Payer: Self-pay | Admitting: *Deleted

## 2024-01-17 ENCOUNTER — Encounter (HOSPITAL_COMMUNITY): Payer: Self-pay

## 2024-01-17 DIAGNOSIS — Z170 Estrogen receptor positive status [ER+]: Secondary | ICD-10-CM

## 2024-01-17 NOTE — Telephone Encounter (Signed)
 Received oncotype results 13/4%. Referral placed for Dr. Basilio Cairo

## 2024-01-21 ENCOUNTER — Telehealth: Payer: Self-pay | Admitting: Radiation Oncology

## 2024-01-21 ENCOUNTER — Encounter: Payer: Self-pay | Admitting: *Deleted

## 2024-01-21 NOTE — Telephone Encounter (Signed)
 Sent Deep Inspiration Breath Hold via MyChart.

## 2024-01-21 NOTE — Telephone Encounter (Signed)
Called patient to schedule a consultation w. Dr. Squire. No answer, LVM for a return call.  

## 2024-01-24 ENCOUNTER — Other Ambulatory Visit: Payer: Self-pay | Admitting: Radiation Oncology

## 2024-01-24 ENCOUNTER — Inpatient Hospital Stay
Admission: RE | Admit: 2024-01-24 | Discharge: 2024-01-24 | Disposition: A | Discharge status: Home / Self Care | Payer: Self-pay | Source: Ambulatory Visit | Attending: Radiation Oncology | Admitting: Radiation Oncology

## 2024-01-24 DIAGNOSIS — Z170 Estrogen receptor positive status [ER+]: Secondary | ICD-10-CM

## 2024-01-24 NOTE — Progress Notes (Signed)
 Location of Breast Cancer: Malignant Neoplasm of Overlapping Sites of Left Breast, Estrogen Receptor Positive  Histology per Pathology Report:    Receptor Status: ER(Positive), PR (Variable Staining), Her2-neu (Negative)  Did patient present with symptoms (if so, please note symptoms) or was this found on screening mammography?:  12/10/2022 Mammogram   12/20/2023 Breast Ultrasound    Past/Anticipated interventions by surgeon, if any:  01/02/2024 Dr. Luisa Hart  Left Breast Seed Bracketed Lumpectomy,  Left Sentinel Lymph Node Mapping (Left) PEC BLOCK.  Past/Anticipated interventions by medical oncology, if any:  12/25/2023 Dr. Al Pimple   Lymphedema issues, if any:  None    Pain issues, if any:  None   SAFETY ISSUES: Prior radiation? None Pacemaker/ICD? None Possible current pregnancy? None Is the patient on methotrexate? None  Current Complaints / other details:   None.

## 2024-01-27 ENCOUNTER — Inpatient Hospital Stay: Attending: Hematology and Oncology | Admitting: Hematology and Oncology

## 2024-01-27 VITALS — BP 145/60 | HR 79 | Resp 18 | Wt 229.1 lb

## 2024-01-27 DIAGNOSIS — Z1732 Human epidermal growth factor receptor 2 negative status: Secondary | ICD-10-CM | POA: Diagnosis not present

## 2024-01-27 DIAGNOSIS — Z90710 Acquired absence of both cervix and uterus: Secondary | ICD-10-CM | POA: Insufficient documentation

## 2024-01-27 DIAGNOSIS — Z8042 Family history of malignant neoplasm of prostate: Secondary | ICD-10-CM | POA: Diagnosis not present

## 2024-01-27 DIAGNOSIS — Z170 Estrogen receptor positive status [ER+]: Secondary | ICD-10-CM

## 2024-01-27 DIAGNOSIS — D699 Hemorrhagic condition, unspecified: Secondary | ICD-10-CM | POA: Diagnosis not present

## 2024-01-27 DIAGNOSIS — C50812 Malignant neoplasm of overlapping sites of left female breast: Secondary | ICD-10-CM | POA: Diagnosis not present

## 2024-01-27 DIAGNOSIS — D698 Other specified hemorrhagic conditions: Secondary | ICD-10-CM | POA: Diagnosis not present

## 2024-01-27 DIAGNOSIS — Z1721 Progesterone receptor positive status: Secondary | ICD-10-CM | POA: Diagnosis not present

## 2024-01-27 NOTE — Progress Notes (Signed)
 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 All margins are negative, and the sentinel lymph node is clear. The oncotype score is 13, indicating that chemotherapy is not necessary. The cancer is estrogen-receptor positive, and the plan is to proceed with radiation therapy followed by antiestrogen therapy. Radiation therapy is planned for 4-6 weeks, 5 days a week, with each session lasting few minutes. Anastrozole therapy is recommended for a minimum of 7 years due to lobular histology, as lobular cancers rely heavily on estrogen for growth.  - Proceed with radiation therapy starting January 31, 2024, for 4-6 weeks. - Initiate anastrozole therapy after completion of radiation therapy, recommended for a minimum of 7 years due to lobular histology.  Post-surgical wound drainage She experienced significant bleeding from the surgical site post-lumpectomy, which has now reduced to minimal pinkish drainage. There is no foul smell or fever, indicating a low likelihood of infection. The wound is being managed with regular dressing changes and mupirocin application. The decision to leave the wound open was made to allow it to heal naturally, which has resulted in minimal bruising. - Continue regular dressing changes and mupirocin application. - Monitor for  signs of infection such as foul smell or fever.  Bleeding diathesis She has excessive bleeding post-surgery, suggesting a possible bleeding diathesis. There is a history of abnormal blood clotting factors noted during previous surgeries. Further workup for bleeding diathesis is planned after completion of radiation therapy. - Plan to conduct a workup for bleeding diathesis after radiation therapy.  FU end of May with me   No orders of the defined types were placed in this encounter.    HISTORY OF PRESENTING ILLNESS:  Morgan Frye 73 y.o. female is here because of new diagnosis of left sided multifocal breast cancer.  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 No evidence of metastatic disease in the abdomen or pelvis. No obvious endometrial or cervical mass demonstrated. 2. 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 3. Cholelithiasis without evidence of cholecystitis. 4. Nonobstructing bilateral renal calculi. 5.  Aortic Atherosclerosis (ICD10-I70.   10/03/2022 Surgery   Robotic-assisted laparoscopic total hysterectomy with bilateral salpingoophorectomy, SLN biopsy bilaterally    Findings:  On EUA, 8 cm mobile uterus. Small cervix flush with the vaginal apex. On intra-abdominal entry, normal upper abdominal survey. Normal small and large bowel. Multiple fecaliths within the pelvis (removed). Uterus 8cm and normal in appearance although posterior serosa tore very easily with any sort of manipulation concerning for possible deeper myometrial invasion. Bilateral ovaries and tubes normal in appearance. SLN mapping successful to two pelvic lymph nodes bilaterally. Some fibrosis of  the retroperitoneum, more on  the left than right. Dense adhesions between the bladder and LUS/cervix.     10/03/2022 Pathology Results   A. LYMPH NODE, SENTINEL, RIGHT OBTURATOR, BIOPSY: - Negative for carcinoma (0/1)  B. LYMPH NODE, SENTINEL, RIGHT EXTERNAL ILIAC, BIOPSY: - Negative for carcinoma (0/1)  C. LYMPH NODE, SENTINEL, LEFT OBTURATOR, BIOPSY: - Negative for carcinoma (0/1)  D. LYMPH NODE, SENTINEL, LEFT OBTURATOR AND ADJACENT NON GREEN LYMPH NODES: - Negative for carcinoma (0/1)  E. UTERUS, CERVIX, BILATERAL FALLOPIAN TUBES AND OVARIES: - Endometrioid carcinoma, FIGO grade 1, pT1a - Myometrial invasion less than 50% - No lymphovascular invasion identified - Cervix: Benign, nabothian cyst - Bilateral fallopian tubes: Benign, paratubal cysts - Bilateral ovaries: No significant pathologic changes - See oncology table  ONCOLOGY TABLE:   UTERUS, CARCINOMA OR CARCINOSARCOMA: Resection  Procedure: Total hysterectomy and bilateral salpingo-oophorectomy Histologic Type: Endometrioid adenocarcinoma Histologic Grade: FIGO grade 1 Myometrial Invasion:      Depth of Myometrial Invasion (mm): 3 mm      Myometrial Thickness (mm): 15 mm      Percentage of Myometrial Invasion: 20% Uterine Serosa Involvement: Not identified Cervical stromal Involvement: Not identified Extent of involvement of other tissue/organs: Not applicable Peritoneal/Ascitic Fluid: Not submitted Lymphovascular Invasion: Not identified Regional Lymph Nodes:      Pelvic Lymph Nodes Examined: 4 Sentinel                               0 non-sentinel                                  4: total      Pelvic Lymph Nodes with Metastasis: 0 Distant Metastasis:      Distant Site(s) Involved: Not applicable Pathologic Stage Classification (pTNM, AJCC 8th Edition): pT1a, pN0 Ancillary Studies: MMR / MSI testing will be ordered Representative Tumor Block: E8 Comment(s): Appropriately controlled p53 immunohistochemical  stain shows wild-type staining - Pancytokeratin stain was performed on the lymph nodes and is negative.     Genetic Testing   Negative genetic testing. No pathogenic variants identified on the [facility] Common Hereditary Cancers+RNA panel. The report date is 11/23/2022.  The Common Hereditary Cancers Panel + RNA offered by [facility] includes sequencing and/or deletion duplication testing of the following 48 genes: APC*, ATM*, AXIN2, BAP1, BARD1, BMPR1A, BRCA1, BRCA2, BRIP1, CDH1, CDK4, CDKN2A (p14ARF), CDKN2A (p16INK4a), CHEK2, CTNNA1, DICER1*, EPCAM*, FH*, GREM1*, HOXB13, KIT, MBD4, MEN1*, MLH1*, MSH2*, MSH3*, MSH6*, MUTYH, NF1*, NTHL1, PALB2, PDGFRA, PMS2*, POLD1*, POLE, PTEN*, RAD51C, RAD51D, SDHA*, SDHB, SDHC*, SDHD, SMAD4, SMARCA4, STK11, TP53, TSC1*, TSC2, VHL.    Malignant 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, estrogen receptor positive (HCC)  12/11/2023 Mammogram   She had a mammogram in February 2025 and this showed a 1.1 cm irregular mass in the left breast at 3:00 posterior depth, 1 cm irregular mass in the left breast at 3:00 middle to posterior depth, the 2 masses span 3.5 cm anteroposteriorly.   12/18/2023 Pathology Results   She has had 3 biopsies of the left breast, at 3:00, 8 cm from the nipple, 9 cm from the centimeters from the nipple.  Prognostics from the 10 cm from the nipple showed ER 99% positive strong staining, PR 70% weak to strong staining, Ki-67 20% HER2 1+.  Prognostics from the 3:00 mass at 9 cm from nipple once again showed ER 100% positive  strong staining PR 90% positive to strong staining HER2 1+ prognostics from the left breast needle biopsy at 3:00 8 cm from the nipple also showed ER 100% positive strong staining PR 75% positive weak to strong staining.   12/23/2023 Initial Diagnosis   Malignant 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, estrogen receptor positive (HCC)   12/25/2023 Cancer Staging   Staging form: Breast, AJCC 8th  Edition - Clinical stage from 12/25/2023: Stage IA (cT1c, cN0, cM0, G2, ER+, PR+, HER2-) - Signed by Rachel Moulds, MD on 12/25/2023 Stage prefix: Initial diagnosis Histologic grading system: 3 grade system    Discussed the use of AI scribe software for clinical note transcription with the patient, who gave verbal consent to proceed.  History of Present Illness    Morgan Frye is a 73 year old female with a history of breast cancer who presents with post-surgical wound complications. She is accompanied by her husband, Jonny Ruiz.  She underwent surgery for breast cancer on January 02, 2024, which involved the removal of a drain. Post-operatively, she experienced significant bleeding from the incision site, described as 'pouring out like she'd been shot,' resulting in a large blood stain on her compression wear. This occurred the night after surgery, and she returned to the surgeon the following day, where a clot was suspected and addressed. The drain was removed eight days post-surgery, leaving an open wound that continued to leak. The wound has been leaking a yellowish fluid, which she describes as 'pussy,' but it is not foul-smelling, and she has not experienced any fever. She manages the wound by changing the bandage three times a day and applying mupirocin, which has improved the condition. She has stopped wearing the compression bra and switched to a compression camisole.  She has a history of bleeding diathesis, which was noted during previous surgeries, including knee surgery and a hysterectomy. Her blood clotting factors were previously noted to be off, and she was advised against using spinal anesthesia. She does not take blood thinners but was advised to stop taking baby aspirin by her gastroenterologist after a CT scan showed no alarming findings regarding her liver.  Her breast cancer was identified as multifocal lobular cancer, with three areas removed, the largest being 2.1 cm, and all  margins were negative. One sentinel lymph node was removed and was clear. She does not require chemotherapy as her oncotype score was favorable.   MEDICAL HISTORY:  Past Medical History:  Diagnosis Date   Arthritis    osteoarthritis- knees, hips, rt. hip bursitis   Blood dyscrasia    bruises easily and bleeds easy after surgery   Breast cancer (HCC) 11/2023   left breast ILV   Diabetes mellitus type 2 in obese    Hemangioma of liver    very small- evaluated and considered stable- no further problems or follow up in many years   History of kidney stones    x1 lithotripsy-has others not a bother at this time   History of palpitations    during menopause, on metoprolol. Saw cardiologist   Hypertension    Obesity (BMI 30-39.9)    Transfusion history    '1981- s/p childbirth    SURGICAL HISTORY: Past Surgical History:  Procedure Laterality Date   ABDOMINAL HYSTERECTOMY     BREAST LUMPECTOMY WITH RADIOACTIVE SEED AND SENTINEL LYMPH NODE BIOPSY Left 01/02/2024   Procedure: LEFT BREAST SEED BRACKETED LUMPECTOMY, LEFT SENTINEL LYMPH NODE MAPPING;  Surgeon: Harriette Bouillon, MD;  Location: [facility]  ;  Service: General;  Laterality: Left;  PEC BLOCK   CATARACT EXTRACTION, BILATERAL Bilateral    5 yrs ago   DILATION AND CURETTAGE OF UTERUS  2010   perimenopause, caused bleeding, had polyp removed   LITHOTRIPSY     palpitations     had durning menopause, saw cards and given metoprolol   ROBOTIC ASSISTED TOTAL HYSTERECTOMY WITH BILATERAL SALPINGO OOPHERECTOMY N/A 10/03/2022   Procedure: XI ROBOTIC ASSISTED TOTAL HYSTERECTOMY WITH BILATERAL SALPINGO OOPHORECTOMY;  Surgeon: Carver Fila, MD;  Location: [facility];  Service: Gynecology;  Laterality: N/A;   SENTINEL NODE BIOPSY N/A 10/03/2022   Procedure: SENTINEL NODE BIOPSY;  Surgeon: Carver Fila, MD;  Location: [facility];  Service: Gynecology;  Laterality: N/A;   TONSILLECTOMY     child   TOTAL KNEE ARTHROPLASTY  Right 05/24/2014   Procedure: RIGHT TOTAL KNEE ARTHROPLASTY;  Surgeon: Loanne Drilling, MD;  Location: [facility];  Service: Orthopedics;  Laterality: Right;   TOTAL KNEE ARTHROPLASTY Left 05/30/2015   Procedure: LEFT TOTAL KNEE ARTHROPLASTY;  Surgeon: Ollen Gross, MD;  Location: [facility];  Service: Orthopedics;  Laterality: Left;    SOCIAL HISTORY: Social History   Socioeconomic History   Marital status: Married    Spouse name: Not on file   Number of children: 2   Years of education: Not on file   Highest education level: Not on file  Occupational History   Occupation: retired  Tobacco Use   Smoking status: Never   Smokeless tobacco: Never  Vaping Use   Vaping status: Never Used  Substance and Sexual Activity   Alcohol use: Never   Drug use: No   Sexual activity: Yes  Other Topics Concern   Not on file  Social History Narrative   Lives with husband.     Social Drivers of Corporate investment banker Strain: Low Risk  (09/13/2022)   Overall Financial Resource Strain (CARDIA)    Difficulty of Paying Living Expenses: Not hard at all  Food Insecurity: No Food Insecurity (12/25/2023)   Hunger Vital Sign    Worried About Running Out of Food in the Last Year: Never true    Ran Out of Food in the Last Year: Never true  Transportation Needs: No Transportation Needs (12/25/2023)   PRAPARE - Administrator, Civil Service (Medical): No    Lack of Transportation (Non-Medical): No  Physical Activity: Not on file  Stress: Not on file  Social Connections: Not on file  Intimate Partner Violence: Not At Risk (12/25/2023)   Humiliation, Afraid, Rape, and Kick questionnaire    Fear of Current or Ex-Partner: No    Emotionally Abused: No    Physically Abused: No    Sexually Abused: No    FAMILY HISTORY: Family History  Problem Relation Age of Onset   Liver disease Mother    Heart disease Father        ICD.  Father is 63   Prostate cancer Father 75   Colon cancer Neg Hx     Breast cancer Neg Hx    Ovarian cancer Neg Hx    Endometrial cancer Neg Hx    Pancreatic cancer Neg Hx     ALLERGIES:  is allergic to penicillins and tylenol [acetaminophen].  MEDICATIONS:  Current Outpatient Medications  Medication Sig Dispense Refill   Accu-Chek FastClix Lancets MISC Apply topically daily.     ACCU-CHEK GUIDE test strip daily.     atorvastatin (LIPITOR)  20 MG tablet Take 20 mg by mouth at bedtime.     Calcium Carb-Cholecalciferol (CALCIUM + VITAMIN D3 PO) Take 1 tablet by mouth every evening.     cetirizine (ZYRTEC) 10 MG tablet Take 10 mg by mouth daily.     Cholecalciferol (VITAMIN D3) 50 MCG (2000 UT) CAPS Take 2,000 Units by mouth daily.     ciclopirox (LOPROX) 0.77 % cream Apply 1 application  topically 2 (two) times daily as needed (yeast under breasts).     lisinopril-hydrochlorothiazide (PRINZIDE,ZESTORETIC) 20-12.5 MG per tablet Take 1 tablet by mouth every morning.     metFORMIN (GLUCOPHAGE-XR) 500 MG 24 hr tablet Take 500 mg by mouth every evening.     metoprolol succinate (TOPROL-XL) 50 MG 24 hr tablet Take 50 mg by mouth every evening. Take with or immediately following a meal.     Multiple Vitamin (MULTIVITAMIN) tablet Take 1 tablet by mouth daily.     oxyCODONE (OXY IR/ROXICODONE) 5 MG immediate release tablet Take 1 tablet (5 mg total) by mouth every 6 (six) hours as needed for severe pain (pain score 7-10). 15 tablet 0   No current facility-administered medications for this visit.     PHYSICAL EXAMINATION: ECOG PERFORMANCE STATUS: 0 - Asymptomatic  Vitals:   01/27/24 0934  BP: (!) 145/60  Pulse: 79  Resp: 18  SpO2: 98%    Filed Weights   01/27/24 0934  Weight: 229 lb 1.6 oz (103.9 kg)     GENERAL:alert, no distress and comfortable Drain site appears well clear fluid dripping, small amounts. No foul smell.   LABORATORY DATA:  I have reviewed the data as listed Lab Results  Component Value Date   WBC 6.0 12/25/2023   HGB 16.3  (H) 12/25/2023   HCT 45.6 12/25/2023   MCV 91.4 12/25/2023   PLT 172 12/25/2023     Chemistry      Component Value Date/Time   NA 139 12/25/2023 1206   K 3.7 12/25/2023 1206   CL 105 12/25/2023 1206   CO2 27 12/25/2023 1206   BUN 16 12/25/2023 1206   CREATININE 0.71 12/25/2023 1206      Component Value Date/Time   CALCIUM 9.7 12/25/2023 1206   ALKPHOS 73 12/25/2023 1206   AST 23 12/25/2023 1206   ALT 21 12/25/2023 1206   BILITOT 1.0 12/25/2023 1206       RADIOGRAPHIC STUDIES: I have personally reviewed the radiological images as listed and agreed with the findings in the report. No results found.  All questions were answered. The patient knows to call the clinic with any problems, questions or concerns. I spent 45 minutes in the care of this patient including H and P, review of records, counseling and coordination of care.     Rachel Moulds, MD 01/27/2024 10:02 AM

## 2024-01-27 NOTE — Assessment & Plan Note (Signed)
 Multifocal lobular breast cancer She has multifocal lobular breast cancer with three areas, the largest being 2.1 cm. All margins are negative, and the sentinel lymph node is clear. The oncotype score is 13, indicating that chemotherapy is not necessary. The cancer is estrogen-receptor positive, and the plan is to proceed with radiation therapy followed by antiestrogen therapy. Radiation therapy is planned for 4-6 weeks, 5 days a week, with each session lasting few minutes. Anastrozole therapy is recommended for a minimum of 7 years due to lobular histology, as lobular cancers rely heavily on estrogen for growth.  - Proceed with radiation therapy starting January 31, 2024, for 4-6 weeks. - Initiate anastrozole therapy after completion of radiation therapy, recommended for a minimum of 7 years due to lobular histology.  Post-surgical wound drainage She experienced significant bleeding from the surgical site post-lumpectomy, which has now reduced to minimal pinkish drainage. There is no foul smell or fever, indicating a low likelihood of infection. The wound is being managed with regular dressing changes and mupirocin application. The decision to leave the wound open was made to allow it to heal naturally, which has resulted in minimal bruising. - Continue regular dressing changes and mupirocin application. - Monitor for signs of infection such as foul smell or fever.  Bleeding diathesis She has excessive bleeding post-surgery, suggesting a possible bleeding diathesis. There is a history of abnormal blood clotting factors noted during previous surgeries. Further workup for bleeding diathesis is planned after completion of radiation therapy. - Plan to conduct a workup for bleeding diathesis after radiation therapy.  FU end of May with me

## 2024-01-28 ENCOUNTER — Encounter: Payer: Self-pay | Admitting: Hematology and Oncology

## 2024-01-30 ENCOUNTER — Encounter: Payer: Self-pay | Admitting: Rehabilitation

## 2024-01-30 ENCOUNTER — Ambulatory Visit: Attending: Surgery | Admitting: Rehabilitation

## 2024-01-30 DIAGNOSIS — Z9189 Other specified personal risk factors, not elsewhere classified: Secondary | ICD-10-CM

## 2024-01-30 DIAGNOSIS — Z483 Aftercare following surgery for neoplasm: Secondary | ICD-10-CM | POA: Diagnosis not present

## 2024-01-30 DIAGNOSIS — C50412 Malignant neoplasm of upper-outer quadrant of left female breast: Secondary | ICD-10-CM | POA: Insufficient documentation

## 2024-01-30 DIAGNOSIS — Z171 Estrogen receptor negative status [ER-]: Secondary | ICD-10-CM | POA: Diagnosis not present

## 2024-01-30 NOTE — Progress Notes (Signed)
 Radiation Oncology         [phone] ________________________________  Name: Morgan Frye MRN: [id number]  Date: 01/31/2024  DOB: [birth year]  Follow-Up Visit Note  Outpatient  CC: Darrow Bussing, MD  Rachel Moulds, MD  Diagnosis:   No diagnosis found.   Stage IA (cT1c, cN0, cM0) Left Breast, Multifocal Invasive Lobular Carcinoma with LCIS, ER+ / PR+ / Her2- by IHC, Grade 2: s/p left breast lumpectomy w/ left axillary SLN evaluation    Cancer Staging  Malignant 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, estrogen receptor positive (HCC) Staging form: Breast, AJCC 8th Edition - Clinical stage from 12/25/2023: Stage IA (cT1c, cN0, cM0, G2, ER+, PR+, HER2-) - Signed by Rachel Moulds, MD on 12/25/2023   CHIEF COMPLAINT: Here to discuss management of left breast cancer  Narrative:  The patient returns today for follow-up.     Since her breast clinic consultation date of 12/25/23, she opted to proceed with a left breast lumpectomy with left axillary SLN excisions on 01/02/24 under the care of Dr. Luisa Hart. Pathology from the procedure revealed: histology of multifocal invasive lobular carcinoma in three separate tumors measuring 2.1 cm, 1.3 cm, and 1.2 cm (respectfully), along with intermediate grade LCIS; all margins negative for invasive and in situ carcinoma; margin status to invasive disease of 1 mm from the medial margin; margin status to in situ disease of 1 mm from the medial, inferior, and anterior margins; negative for LVI; nodal status of 1/1 left axillary SLN excision negative for carcinoma;  ER status: 99% positive with strong staining intensity; PR status 70% positive with weak-strong staining intensity; Proliferation marker Ki67 at 20%; Her2 status negative by IHC; Grade 2.  Oncotype DX was obtained on the final surgical sample and shoed a recurrence score of 13 which predicts a risk of recurrence outside the breast over the next 9 years of 4%, if the patient's  only systemic therapy were to be an antiestrogen for 5 years. It also predicts no significant benefit from chemotherapy.  Post-operatively, she experienced bleeding from the surgical site characterized as well as some drainage seeping around the placed drain. Wound care has consisted of regular dressing changes and mupirocin application, and Dr. Luisa Hart has advised leaving the wound open to facilitate natural healing. Although her bleeding and drainage have improved, she apparently has a history of abnormal blood clotting noted following previous surgeries. Further workup for bleeding diathesis has been subsequently planned and will likely happen after she completes radiation therapy.   She most recently followed up with Dr. Al Pimple on 01/27/24 to discuss antiestrogen treatment options. Based on her lobular histology, Dr. Al Pimple has recommended antiestrogen therapy consisting of anastrozole which the patient has agreed to start after XRT.    Symptomatically, the patient reports: ***        ALLERGIES:  is allergic to penicillins and tylenol [acetaminophen].  Meds: Current Outpatient Medications  Medication Sig Dispense Refill   Accu-Chek FastClix Lancets MISC Apply topically daily.     ACCU-CHEK GUIDE test strip daily.     atorvastatin (LIPITOR) 20 MG tablet Take 20 mg by mouth at bedtime.     Calcium Carb-Cholecalciferol (CALCIUM + VITAMIN D3 PO) Take 1 tablet by mouth every evening.     cetirizine (ZYRTEC) 10 MG tablet Take 10 mg by mouth daily.     Cholecalciferol (VITAMIN D3) 50 MCG (2000 UT) CAPS Take 2,000 Units by mouth daily.     ciclopirox (LOPROX) 0.77 % cream Apply  1 application  topically 2 (two) times daily as needed (yeast under breasts).     lisinopril-hydrochlorothiazide (PRINZIDE,ZESTORETIC) 20-12.5 MG per tablet Take 1 tablet by mouth every morning.     metFORMIN (GLUCOPHAGE-XR) 500 MG 24 hr tablet Take 500 mg by mouth every evening.     metoprolol succinate (TOPROL-XL) 50 MG 24  hr tablet Take 50 mg by mouth every evening. Take with or immediately following a meal.     Multiple Vitamin (MULTIVITAMIN) tablet Take 1 tablet by mouth daily.     oxyCODONE (OXY IR/ROXICODONE) 5 MG immediate release tablet Take 1 tablet (5 mg total) by mouth every 6 (six) hours as needed for severe pain (pain score 7-10). 15 tablet 0   No current facility-administered medications for this encounter.    Physical Findings:  vitals were not taken for this visit. .     General: Alert and oriented, in no acute distress HEENT: Head is normocephalic. Extraocular movements are intact. Oropharynx is clear. Neck: Neck is supple, no palpable cervical or supraclavicular lymphadenopathy. Heart: Regular in rate and rhythm with no murmurs, rubs, or gallops. Chest: Clear to auscultation bilaterally, with no rhonchi, wheezes, or rales. Abdomen: Soft, nontender, nondistended, with no rigidity or guarding. Extremities: No cyanosis or edema. Lymphatics: see Neck Exam Musculoskeletal: symmetric strength and muscle tone throughout. Neurologic: No obvious focalities. Speech is fluent.  Psychiatric: Judgment and insight are intact. Affect is appropriate. Breast exam reveals ***  Lab Findings: Lab Results  Component Value Date   WBC 6.0 12/25/2023   HGB 16.3 (H) 12/25/2023   HCT 45.6 12/25/2023   MCV 91.4 12/25/2023   PLT 172 12/25/2023    @LASTCHEMISTRY @  Radiographic Findings: No results found.  Impression/Plan: We discussed adjuvant radiotherapy today.  I recommend *** in order to ***.  I reviewed the logistics, benefits, risks, and potential side effects of this treatment in detail. Risks may include but not necessary be limited to acute and late injury tissue in the radiation fields such as skin irritation (change in color/pigmentation, itching, dryness, pain, peeling). She may experience fatigue. We also discussed possible risk of long term cosmetic changes or scar tissue. There is also a  smaller risk for lung toxicity, ***cardiac toxicity, ***brachial plexopathy, ***lymphedema, ***musculoskeletal changes, ***rib fragility or ***induction of a second malignancy, ***late chronic non-healing soft tissue wound.    The patient asked good questions which I answered to her satisfaction. She is enthusiastic about proceeding with treatment. A consent form has been *** signed and placed in her chart.  A total of *** medically necessary complex treatment devices will be fabricated and supervised by me: *** fields with MLCs for custom blocks to protect heart, and lungs;  and, a Vac-lok. MORE COMPLEX DEVICES MAY BE MADE IN DOSIMETRY FOR FIELD IN FIELD BEAMS FOR DOSE HOMOGENEITY.  I have requested : 3D Simulation which is medically necessary to give adequate dose to at risk tissues while sparing lungs and heart.  I have requested a DVH of the following structures: lungs, heart, *** lumpectomy cavity.    The patient will receive *** Gy in *** fractions to the *** with *** fields.  This will be *** followed by a boost.  On date of service, in total, I spent *** minutes on this encounter. Patient was seen in person.  _____________________________________   Lonie Peak, MD  This document serves as a record of services personally performed by Lonie Peak, MD. It was created on her behalf by Neena Rhymes,  a trained medical scribe. The creation of this record is based on the scribe's personal observations and the provider's statements to them. This document has been checked and approved by the attending provider.

## 2024-01-30 NOTE — Therapy (Signed)
 OUTPATIENT PHYSICAL THERAPY BREAST CANCER POST OP FOLLOW UP   Patient Name: Morgan Frye MRN: [id number] DOB:Nov 22, 1950, 73 y.o., female Today's Date: 01/30/2024  END OF SESSION:  PT End of Session - 01/30/24 1234     Visit Number 2    Number of Visits 2    Date for PT Re-Evaluation 02/19/24    PT Start Time 1200    PT Stop Time 1232    PT Time Calculation (min) 32 min    Activity Tolerance Patient tolerated treatment well    Behavior During Therapy [facility] for tasks assessed/performed             Past Medical History:  Diagnosis Date   Arthritis    osteoarthritis- knees, hips, rt. hip bursitis   Blood dyscrasia    bruises easily and bleeds easy after surgery   Breast cancer (HCC) 11/2023   left breast ILV   Diabetes mellitus type 2 in obese    Hemangioma of liver    very small- evaluated and considered stable- no further problems or follow up in many years   History of kidney stones    x1 lithotripsy-has others not a bother at this time   History of palpitations    during menopause, on metoprolol. Saw cardiologist   Hypertension    Obesity (BMI 30-39.9)    Transfusion history    '1981- s/p childbirth   Past Surgical History:  Procedure Laterality Date   ABDOMINAL HYSTERECTOMY     BREAST LUMPECTOMY WITH RADIOACTIVE SEED AND SENTINEL LYMPH NODE BIOPSY Left 01/02/2024   Procedure: LEFT BREAST SEED BRACKETED LUMPECTOMY, LEFT SENTINEL LYMPH NODE MAPPING;  Surgeon: Harriette Bouillon, MD;  Location: [facility];  Service: General;  Laterality: Left;  PEC BLOCK   CATARACT EXTRACTION, BILATERAL Bilateral    5 yrs ago   DILATION AND CURETTAGE OF UTERUS  2010   perimenopause, caused bleeding, had polyp removed   LITHOTRIPSY     palpitations     had durning menopause, saw cards and given metoprolol   ROBOTIC ASSISTED TOTAL HYSTERECTOMY WITH BILATERAL SALPINGO OOPHERECTOMY N/A 10/03/2022   Procedure: XI ROBOTIC ASSISTED TOTAL HYSTERECTOMY WITH BILATERAL SALPINGO  OOPHORECTOMY;  Surgeon: Carver Fila, MD;  Location: [facility];  Service: Gynecology;  Laterality: N/A;   SENTINEL NODE BIOPSY N/A 10/03/2022   Procedure: SENTINEL NODE BIOPSY;  Surgeon: Carver Fila, MD;  Location: [facility];  Service: Gynecology;  Laterality: N/A;   TONSILLECTOMY     child   TOTAL KNEE ARTHROPLASTY Right 05/24/2014   Procedure: RIGHT TOTAL KNEE ARTHROPLASTY;  Surgeon: Loanne Drilling, MD;  Location: [facility];  Service: Orthopedics;  Laterality: Right;   TOTAL KNEE ARTHROPLASTY Left 05/30/2015   Procedure: LEFT TOTAL KNEE ARTHROPLASTY;  Surgeon: Ollen Gross, MD;  Location: [facility];  Service: Orthopedics;  Laterality: Left;   Patient Active Problem List   Diagnosis Date Noted   Carcinoma of upper-outer quadrant of female breast, left (HCC) 12/25/2023   Malignant 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, estrogen receptor positive (HCC) 12/23/2023   Genetic testing 11/27/2022   Endometrial cancer (HCC) 09/07/2022   Type 2 diabetes mellitus with obesity (HCC) 09/07/2022   Obesity (BMI 30-39.9) 09/07/2022   Palpitation 03/25/2015   Hyperlipidemia 05/28/2014   Essential hypertension, benign 05/28/2014   Constipation 05/28/2014   Hyponatremia 05/26/2014   Postoperative anemia due to acute blood loss 05/26/2014   OA (osteoarthritis) of knee 05/24/2014    PCP: Koirala Dibas,  MD  REFERRING PROVIDER: Harriette Bouillon, MD  REFERRING DIAG: Left breast cancer   THERAPY DIAG:  Malignant neoplasm of upper-outer quadrant of left breast in female, estrogen receptor negative [facility])  Aftercare following surgery for neoplasm  At risk for lymphedema  Rationale for Evaluation and Treatment: Rehabilitation  ONSET DATE: 12/17/23  SUBJECTIVE:                                                                                                                                                                                           SUBJECTIVE STATEMENT: The only  problem I have is the incision that has stayed open at the drain - The other incisions healed well. I had to have a drain for my lumpectomy due to my clotting factor.  They took it out last Monday.   PERTINENT HISTORY:  Lt lumpectomy on 01/02/24 with 1 negative node removed. 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 It measures 3.5 cm and is located in the upper outer quadrant. It is ER/PR positive and HER2 negative with a Ki67 of 20%. She has a history of endometrial cancer 09/04/2022 treated surgically with a hysterectomy and 4 lymph nodes removed. She has a mild risk of bilateral lower extremity lymphedema.   Per MD note: She experienced significant bleeding from the surgical site post-lumpectomy, which has now reduced to minimal pinkish drainage. There is no foul smell or fever, indicating a low likelihood of infection. The wound is being managed with regular dressing changes and mupirocin application. The decision to leave the wound open was made to allow it to heal naturally, which has resulted in minimal bruising.   PATIENT GOALS:  Reassess how my recovery is going related to arm function, pain, and swelling.  PAIN:  Are you having pain? It is just achy.    PRECAUTIONS: Recent Surgery, left UE Lymphedema risk, bleeding trouble.   RED FLAGS: None   ACTIVITY LEVEL / LEISURE: Back to normal activities   OBJECTIVE:   PATIENT SURVEYS:  QUICK DASH: 2.27% from 0%   OBSERVATIONS: Wearing compression cami now , did not assess covered and compressed incisions today.   POSTURE:  Rounded shoulders   LYMPHEDEMA ASSESSMENT:   UPPER EXTREMITY AROM/PROM:   A/PROM RIGHT   eval    Shoulder extension 52  Shoulder flexion 147  Shoulder abduction 149  Shoulder internal rotation 77  Shoulder external rotation 77                          (Blank rows = not tested)   A/PROM LEFT  eval 01/30/24  Shoulder extension 52 60  Shoulder flexion  137 145  Shoulder abduction 151 160  Shoulder internal rotation 67 70  Shoulder external rotation 88 85                          (Blank rows = not tested)   CERVICAL AROM: All within normal limits     UPPER EXTREMITY STRENGTH: WNL   LYMPHEDEMA ASSESSMENTS (in cm):    LANDMARK RIGHT   eval  10 cm proximal to olecranon process 32.6  Olecranon process 26.5  10 cm proximal to ulnar styloid process 15.4  Just proximal to ulnar styloid process 17.5  Across hand at thumb web space 19.5  At base of 2nd digit 7  (Blank rows = not tested)       LANDMARK LEFT   eval 01/30/24  10 cm proximal to olecranon process 33.2 34.2  Olecranon process 26.8 27  10  cm proximal to ulnar styloid process 23.8 23.8  Just proximal to ulnar styloid process 16.9 17  Across hand at thumb web space 19.8 19.5  At base of 2nd digit 6.8 7.0  (Blank rows = not tested)   PATIENT EDUCATION:  Education details: per post op information below Person educated: Patient Education method: Programmer, multimedia, Facilities manager, and Handouts Education comprehension: verbalized understanding  HOME EXERCISE PROGRAM: Reviewed previously given post op HEP.   ASSESSMENT:  CLINICAL IMPRESSION: Pt is doing really well except for a chronically draining drain site from drain that was inserted due to bleeding disorder and reponse to gyn cancer surgery last year.  She has improved her AROM to more than baseline.  She was education per below and knows when to return.   Pt will benefit from skilled therapeutic intervention to improve on the following deficits: Decreased knowledge of precautions, impaired UE functional use, pain, decreased ROM, postural dysfunction.   PT treatment/interventions: ADL/Self care home management, 97110-Therapeutic exercises, 97530- Therapeutic activity, [id number]- Neuromuscular re-education, 97535- Self Care, and 09811- Manual therapy   GOALS: Goals reviewed with patient? Yes  LONG TERM GOALS:   (STG=LTG)  GOALS Name Target Date  Goal status  1 Pt will demonstrate she has regained full shoulder ROM and function post operatively compared to baselines.  Baseline: 01/30/24 INITIAL                    PLAN:  PT FREQUENCY/DURATION: SOZO only  PLAN FOR NEXT SESSION: SOZO   [facility]  [address]  [place] [address]  [phone]  After Breast Cancer Class Video It is recommended you view the ABC class video to be educated on lymphedema risk reduction. This video lasts for about 30 minutes. It can be viewed on our website here: [email or web address]  Scar massage You can begin gentle scar massage to you incision sites. Gently place one hand on the incision and move the skin (without sliding on the skin) in various directions. Do this for a few minutes and then you can gently massage either coconut oil or vitamin E cream into the scars.  Compression garment You should continue wearing your compression bra until you feel like you no longer have swelling.  Home exercise Program Continue doing the exercises you were given until you feel like you can do them without feeling any tightness at the end.   Walking Program Studies show that 30 minutes of walking per day (fast enough to elevate your heart  rate) can significantly reduce the risk of a cancer recurrence. If you can't walk due to other medical reasons, we encourage you to find another activity you could do (like a stationary bike or water exercise).  Posture After breast cancer surgery, people frequently sit with rounded shoulders posture because it puts their incisions on slack and feels better. If you sit like this and scar tissue forms in that position, you can become very tight and have pain sitting or standing with good posture. Try to be aware of your posture and sit and stand up tall to heal properly.  Follow up  PT: It is recommended you return every 3 months for the first 3 years following surgery to be assessed on the SOZO machine for an L-Dex score. This helps prevent clinically significant lymphedema in 95% of patients. These follow up screens are 10 minute appointments that you are not billed for.  Idamae Lusher, PT 01/30/2024, 12:35 PM  PHYSICAL THERAPY DISCHARGE SUMMARY  Visits from Start of Care: 2  Current functional level related to goals / functional outcomes: Per above   Remaining deficits: Lymphema risk    Education / Equipment: Per above.   Plan: Patient agrees to discharge.  Patient is being discharged due to meeting the stated rehab goals.

## 2024-01-31 ENCOUNTER — Ambulatory Visit
Admission: RE | Admit: 2024-01-31 | Discharge: 2024-01-31 | Disposition: A | Discharge status: Home / Self Care | Source: Ambulatory Visit | Attending: Radiation Oncology | Admitting: Radiation Oncology

## 2024-01-31 VITALS — BP 154/80 | HR 72 | Temp 97.9°F | Resp 18 | Ht 65.0 in | Wt 227.4 lb

## 2024-01-31 DIAGNOSIS — Z1721 Progesterone receptor positive status: Secondary | ICD-10-CM | POA: Insufficient documentation

## 2024-01-31 DIAGNOSIS — C50812 Malignant neoplasm of overlapping sites of left female breast: Secondary | ICD-10-CM | POA: Insufficient documentation

## 2024-01-31 DIAGNOSIS — Z1732 Human epidermal growth factor receptor 2 negative status: Secondary | ICD-10-CM | POA: Diagnosis not present

## 2024-01-31 DIAGNOSIS — Z7984 Long term (current) use of oral hypoglycemic drugs: Secondary | ICD-10-CM | POA: Diagnosis not present

## 2024-01-31 DIAGNOSIS — Z510 Encounter for antineoplastic radiation therapy: Secondary | ICD-10-CM | POA: Diagnosis not present

## 2024-01-31 DIAGNOSIS — C50412 Malignant neoplasm of upper-outer quadrant of left female breast: Secondary | ICD-10-CM | POA: Diagnosis not present

## 2024-01-31 DIAGNOSIS — Z170 Estrogen receptor positive status [ER+]: Secondary | ICD-10-CM | POA: Insufficient documentation

## 2024-01-31 DIAGNOSIS — Z79899 Other long term (current) drug therapy: Secondary | ICD-10-CM | POA: Insufficient documentation

## 2024-01-31 DIAGNOSIS — Z923 Personal history of irradiation: Secondary | ICD-10-CM | POA: Insufficient documentation

## 2024-02-10 ENCOUNTER — Encounter: Payer: Self-pay | Admitting: *Deleted

## 2024-02-10 DIAGNOSIS — Z1732 Human epidermal growth factor receptor 2 negative status: Secondary | ICD-10-CM | POA: Diagnosis not present

## 2024-02-10 DIAGNOSIS — Z170 Estrogen receptor positive status [ER+]: Secondary | ICD-10-CM

## 2024-02-10 DIAGNOSIS — C50412 Malignant neoplasm of upper-outer quadrant of left female breast: Secondary | ICD-10-CM | POA: Diagnosis not present

## 2024-02-10 DIAGNOSIS — Z1721 Progesterone receptor positive status: Secondary | ICD-10-CM | POA: Diagnosis not present

## 2024-02-10 DIAGNOSIS — C50812 Malignant neoplasm of overlapping sites of left female breast: Secondary | ICD-10-CM | POA: Diagnosis not present

## 2024-02-10 DIAGNOSIS — Z510 Encounter for antineoplastic radiation therapy: Secondary | ICD-10-CM | POA: Diagnosis not present

## 2024-02-13 ENCOUNTER — Telehealth: Payer: Self-pay | Admitting: Hematology and Oncology

## 2024-02-13 NOTE — Telephone Encounter (Signed)
 Confirmed with pt scheduled appt date and time

## 2024-02-17 ENCOUNTER — Other Ambulatory Visit: Payer: Self-pay

## 2024-02-17 ENCOUNTER — Ambulatory Visit
Admission: RE | Admit: 2024-02-17 | Discharge: 2024-02-17 | Disposition: A | Discharge status: Home / Self Care | Source: Ambulatory Visit | Attending: Radiation Oncology

## 2024-02-17 ENCOUNTER — Ambulatory Visit
Admission: RE | Admit: 2024-02-17 | Discharge: 2024-02-17 | Disposition: A | Discharge status: Home / Self Care | Source: Ambulatory Visit | Attending: Radiation Oncology | Admitting: Radiation Oncology

## 2024-02-17 DIAGNOSIS — Z961 Presence of intraocular lens: Secondary | ICD-10-CM | POA: Diagnosis not present

## 2024-02-17 DIAGNOSIS — Z510 Encounter for antineoplastic radiation therapy: Secondary | ICD-10-CM | POA: Diagnosis not present

## 2024-02-17 DIAGNOSIS — H5203 Hypermetropia, bilateral: Secondary | ICD-10-CM | POA: Diagnosis not present

## 2024-02-17 DIAGNOSIS — Z1732 Human epidermal growth factor receptor 2 negative status: Secondary | ICD-10-CM | POA: Diagnosis not present

## 2024-02-17 DIAGNOSIS — E119 Type 2 diabetes mellitus without complications: Secondary | ICD-10-CM | POA: Diagnosis not present

## 2024-02-17 DIAGNOSIS — C50812 Malignant neoplasm of overlapping sites of left female breast: Secondary | ICD-10-CM | POA: Diagnosis not present

## 2024-02-17 DIAGNOSIS — Z1721 Progesterone receptor positive status: Secondary | ICD-10-CM | POA: Diagnosis not present

## 2024-02-17 DIAGNOSIS — Z170 Estrogen receptor positive status [ER+]: Secondary | ICD-10-CM | POA: Diagnosis not present

## 2024-02-17 DIAGNOSIS — C50412 Malignant neoplasm of upper-outer quadrant of left female breast: Secondary | ICD-10-CM | POA: Diagnosis not present

## 2024-02-17 LAB — RAD ONC ARIA SESSION SUMMARY
Course Elapsed Days: 0
Plan Fractions Treated to Date: 1
Plan Prescribed Dose Per Fraction: 2.67 Gy
Plan Total Fractions Prescribed: 15
Plan Total Prescribed Dose: 40.05 Gy
Reference Point Dosage Given to Date: 2.67 Gy
Reference Point Session Dosage Given: 2.67 Gy
Session Number: 1

## 2024-02-18 ENCOUNTER — Other Ambulatory Visit: Payer: Self-pay

## 2024-02-18 ENCOUNTER — Ambulatory Visit
Admission: RE | Admit: 2024-02-18 | Discharge: 2024-02-18 | Disposition: A | Discharge status: Home / Self Care | Source: Ambulatory Visit | Attending: Radiation Oncology | Admitting: Radiation Oncology

## 2024-02-18 DIAGNOSIS — Z961 Presence of intraocular lens: Secondary | ICD-10-CM | POA: Diagnosis not present

## 2024-02-18 DIAGNOSIS — Z170 Estrogen receptor positive status [ER+]: Secondary | ICD-10-CM | POA: Diagnosis not present

## 2024-02-18 DIAGNOSIS — E119 Type 2 diabetes mellitus without complications: Secondary | ICD-10-CM | POA: Diagnosis not present

## 2024-02-18 DIAGNOSIS — Z510 Encounter for antineoplastic radiation therapy: Secondary | ICD-10-CM | POA: Diagnosis not present

## 2024-02-18 DIAGNOSIS — H5203 Hypermetropia, bilateral: Secondary | ICD-10-CM | POA: Diagnosis not present

## 2024-02-18 DIAGNOSIS — Z1721 Progesterone receptor positive status: Secondary | ICD-10-CM | POA: Diagnosis not present

## 2024-02-18 DIAGNOSIS — Z1732 Human epidermal growth factor receptor 2 negative status: Secondary | ICD-10-CM | POA: Diagnosis not present

## 2024-02-18 DIAGNOSIS — C50812 Malignant neoplasm of overlapping sites of left female breast: Secondary | ICD-10-CM | POA: Diagnosis not present

## 2024-02-18 LAB — RAD ONC ARIA SESSION SUMMARY
Course Elapsed Days: 1
Plan Fractions Treated to Date: 2
Plan Prescribed Dose Per Fraction: 2.67 Gy
Plan Total Fractions Prescribed: 15
Plan Total Prescribed Dose: 40.05 Gy
Reference Point Dosage Given to Date: 5.34 Gy
Reference Point Session Dosage Given: 2.67 Gy
Session Number: 2

## 2024-02-19 ENCOUNTER — Ambulatory Visit
Admission: RE | Admit: 2024-02-19 | Discharge: 2024-02-19 | Disposition: A | Discharge status: Home / Self Care | Source: Ambulatory Visit | Attending: Radiation Oncology | Admitting: Radiation Oncology

## 2024-02-19 ENCOUNTER — Other Ambulatory Visit: Payer: Self-pay

## 2024-02-19 DIAGNOSIS — C50812 Malignant neoplasm of overlapping sites of left female breast: Secondary | ICD-10-CM | POA: Diagnosis not present

## 2024-02-19 DIAGNOSIS — C50412 Malignant neoplasm of upper-outer quadrant of left female breast: Secondary | ICD-10-CM | POA: Diagnosis not present

## 2024-02-19 DIAGNOSIS — Z170 Estrogen receptor positive status [ER+]: Secondary | ICD-10-CM | POA: Diagnosis not present

## 2024-02-19 DIAGNOSIS — Z1732 Human epidermal growth factor receptor 2 negative status: Secondary | ICD-10-CM | POA: Diagnosis not present

## 2024-02-19 DIAGNOSIS — Z510 Encounter for antineoplastic radiation therapy: Secondary | ICD-10-CM | POA: Diagnosis not present

## 2024-02-19 DIAGNOSIS — Z1721 Progesterone receptor positive status: Secondary | ICD-10-CM | POA: Diagnosis not present

## 2024-02-19 LAB — RAD ONC ARIA SESSION SUMMARY
Course Elapsed Days: 2
Plan Fractions Treated to Date: 3
Plan Prescribed Dose Per Fraction: 2.67 Gy
Plan Total Fractions Prescribed: 15
Plan Total Prescribed Dose: 40.05 Gy
Reference Point Dosage Given to Date: 8.01 Gy
Reference Point Session Dosage Given: 2.67 Gy
Session Number: 3

## 2024-02-20 ENCOUNTER — Ambulatory Visit
Admission: RE | Admit: 2024-02-20 | Discharge: 2024-02-20 | Disposition: A | Discharge status: Home / Self Care | Source: Ambulatory Visit | Attending: Radiation Oncology | Admitting: Radiation Oncology

## 2024-02-20 ENCOUNTER — Other Ambulatory Visit: Payer: Self-pay

## 2024-02-20 DIAGNOSIS — C50812 Malignant neoplasm of overlapping sites of left female breast: Secondary | ICD-10-CM | POA: Diagnosis not present

## 2024-02-20 DIAGNOSIS — Z1732 Human epidermal growth factor receptor 2 negative status: Secondary | ICD-10-CM | POA: Diagnosis not present

## 2024-02-20 DIAGNOSIS — Z1721 Progesterone receptor positive status: Secondary | ICD-10-CM | POA: Diagnosis not present

## 2024-02-20 DIAGNOSIS — Z510 Encounter for antineoplastic radiation therapy: Secondary | ICD-10-CM | POA: Diagnosis not present

## 2024-02-20 DIAGNOSIS — Z170 Estrogen receptor positive status [ER+]: Secondary | ICD-10-CM | POA: Diagnosis not present

## 2024-02-20 DIAGNOSIS — C50412 Malignant neoplasm of upper-outer quadrant of left female breast: Secondary | ICD-10-CM | POA: Diagnosis not present

## 2024-02-20 LAB — RAD ONC ARIA SESSION SUMMARY
Course Elapsed Days: 3
Plan Fractions Treated to Date: 4
Plan Prescribed Dose Per Fraction: 2.67 Gy
Plan Total Fractions Prescribed: 15
Plan Total Prescribed Dose: 40.05 Gy
Reference Point Dosage Given to Date: 10.68 Gy
Reference Point Session Dosage Given: 2.67 Gy
Session Number: 4

## 2024-02-21 ENCOUNTER — Other Ambulatory Visit: Payer: Self-pay

## 2024-02-21 ENCOUNTER — Ambulatory Visit
Admission: RE | Admit: 2024-02-21 | Discharge: 2024-02-21 | Disposition: A | Discharge status: Home / Self Care | Source: Ambulatory Visit | Attending: Radiation Oncology | Admitting: Radiation Oncology

## 2024-02-21 DIAGNOSIS — Z1732 Human epidermal growth factor receptor 2 negative status: Secondary | ICD-10-CM | POA: Diagnosis not present

## 2024-02-21 DIAGNOSIS — C50412 Malignant neoplasm of upper-outer quadrant of left female breast: Secondary | ICD-10-CM | POA: Diagnosis not present

## 2024-02-21 DIAGNOSIS — C50812 Malignant neoplasm of overlapping sites of left female breast: Secondary | ICD-10-CM | POA: Diagnosis not present

## 2024-02-21 DIAGNOSIS — Z1721 Progesterone receptor positive status: Secondary | ICD-10-CM | POA: Diagnosis not present

## 2024-02-21 DIAGNOSIS — Z170 Estrogen receptor positive status [ER+]: Secondary | ICD-10-CM | POA: Diagnosis not present

## 2024-02-21 DIAGNOSIS — Z510 Encounter for antineoplastic radiation therapy: Secondary | ICD-10-CM | POA: Diagnosis not present

## 2024-02-21 LAB — RAD ONC ARIA SESSION SUMMARY
Course Elapsed Days: 4
Plan Fractions Treated to Date: 5
Plan Prescribed Dose Per Fraction: 2.67 Gy
Plan Total Fractions Prescribed: 15
Plan Total Prescribed Dose: 40.05 Gy
Reference Point Dosage Given to Date: 13.35 Gy
Reference Point Session Dosage Given: 2.67 Gy
Session Number: 5

## 2024-02-24 ENCOUNTER — Ambulatory Visit
Admission: RE | Admit: 2024-02-24 | Discharge: 2024-02-24 | Disposition: A | Discharge status: Home / Self Care | Source: Ambulatory Visit | Attending: Radiation Oncology | Admitting: Radiation Oncology

## 2024-02-24 ENCOUNTER — Other Ambulatory Visit: Payer: Self-pay

## 2024-02-24 DIAGNOSIS — Z510 Encounter for antineoplastic radiation therapy: Secondary | ICD-10-CM | POA: Diagnosis not present

## 2024-02-24 DIAGNOSIS — C50812 Malignant neoplasm of overlapping sites of left female breast: Secondary | ICD-10-CM | POA: Diagnosis not present

## 2024-02-24 DIAGNOSIS — Z1721 Progesterone receptor positive status: Secondary | ICD-10-CM | POA: Diagnosis not present

## 2024-02-24 DIAGNOSIS — Z1732 Human epidermal growth factor receptor 2 negative status: Secondary | ICD-10-CM | POA: Diagnosis not present

## 2024-02-24 DIAGNOSIS — C50412 Malignant neoplasm of upper-outer quadrant of left female breast: Secondary | ICD-10-CM | POA: Diagnosis not present

## 2024-02-24 DIAGNOSIS — Z170 Estrogen receptor positive status [ER+]: Secondary | ICD-10-CM | POA: Diagnosis not present

## 2024-02-24 LAB — RAD ONC ARIA SESSION SUMMARY
Course Elapsed Days: 7
Plan Fractions Treated to Date: 6
Plan Prescribed Dose Per Fraction: 2.67 Gy
Plan Total Fractions Prescribed: 15
Plan Total Prescribed Dose: 40.05 Gy
Reference Point Dosage Given to Date: 16.02 Gy
Reference Point Session Dosage Given: 2.67 Gy
Session Number: 6

## 2024-02-25 ENCOUNTER — Other Ambulatory Visit: Payer: Self-pay

## 2024-02-25 ENCOUNTER — Ambulatory Visit
Admission: RE | Admit: 2024-02-25 | Discharge: 2024-02-25 | Disposition: A | Discharge status: Home / Self Care | Source: Ambulatory Visit | Attending: Radiation Oncology | Admitting: Radiation Oncology

## 2024-02-25 DIAGNOSIS — C50812 Malignant neoplasm of overlapping sites of left female breast: Secondary | ICD-10-CM | POA: Diagnosis not present

## 2024-02-25 DIAGNOSIS — Z1721 Progesterone receptor positive status: Secondary | ICD-10-CM | POA: Diagnosis not present

## 2024-02-25 DIAGNOSIS — Z170 Estrogen receptor positive status [ER+]: Secondary | ICD-10-CM | POA: Diagnosis not present

## 2024-02-25 DIAGNOSIS — Z1732 Human epidermal growth factor receptor 2 negative status: Secondary | ICD-10-CM | POA: Diagnosis not present

## 2024-02-25 DIAGNOSIS — Z510 Encounter for antineoplastic radiation therapy: Secondary | ICD-10-CM | POA: Diagnosis not present

## 2024-02-25 LAB — RAD ONC ARIA SESSION SUMMARY
Course Elapsed Days: 8
Plan Fractions Treated to Date: 7
Plan Prescribed Dose Per Fraction: 2.67 Gy
Plan Total Fractions Prescribed: 15
Plan Total Prescribed Dose: 40.05 Gy
Reference Point Dosage Given to Date: 18.69 Gy
Reference Point Session Dosage Given: 2.67 Gy
Session Number: 7

## 2024-02-26 ENCOUNTER — Ambulatory Visit
Admission: RE | Admit: 2024-02-26 | Discharge: 2024-02-26 | Disposition: A | Discharge status: Home / Self Care | Source: Ambulatory Visit | Attending: Radiation Oncology

## 2024-02-26 ENCOUNTER — Other Ambulatory Visit: Payer: Self-pay

## 2024-02-26 DIAGNOSIS — Z510 Encounter for antineoplastic radiation therapy: Secondary | ICD-10-CM | POA: Diagnosis not present

## 2024-02-26 DIAGNOSIS — Z1732 Human epidermal growth factor receptor 2 negative status: Secondary | ICD-10-CM | POA: Diagnosis not present

## 2024-02-26 DIAGNOSIS — Z170 Estrogen receptor positive status [ER+]: Secondary | ICD-10-CM | POA: Diagnosis not present

## 2024-02-26 DIAGNOSIS — Z1721 Progesterone receptor positive status: Secondary | ICD-10-CM | POA: Diagnosis not present

## 2024-02-26 DIAGNOSIS — C50812 Malignant neoplasm of overlapping sites of left female breast: Secondary | ICD-10-CM | POA: Diagnosis not present

## 2024-02-26 DIAGNOSIS — C50412 Malignant neoplasm of upper-outer quadrant of left female breast: Secondary | ICD-10-CM | POA: Diagnosis not present

## 2024-02-26 LAB — RAD ONC ARIA SESSION SUMMARY
Course Elapsed Days: 9
Plan Fractions Treated to Date: 8
Plan Prescribed Dose Per Fraction: 2.67 Gy
Plan Total Fractions Prescribed: 15
Plan Total Prescribed Dose: 40.05 Gy
Reference Point Dosage Given to Date: 21.36 Gy
Reference Point Session Dosage Given: 2.67 Gy
Session Number: 8

## 2024-02-27 ENCOUNTER — Ambulatory Visit
Admission: RE | Admit: 2024-02-27 | Discharge: 2024-02-27 | Disposition: A | Discharge status: Home / Self Care | Source: Ambulatory Visit | Attending: Radiation Oncology | Admitting: Radiation Oncology

## 2024-02-27 ENCOUNTER — Other Ambulatory Visit: Payer: Self-pay

## 2024-02-27 DIAGNOSIS — Z1732 Human epidermal growth factor receptor 2 negative status: Secondary | ICD-10-CM | POA: Insufficient documentation

## 2024-02-27 DIAGNOSIS — Z510 Encounter for antineoplastic radiation therapy: Secondary | ICD-10-CM | POA: Diagnosis not present

## 2024-02-27 DIAGNOSIS — Z170 Estrogen receptor positive status [ER+]: Secondary | ICD-10-CM | POA: Diagnosis not present

## 2024-02-27 DIAGNOSIS — C50412 Malignant neoplasm of upper-outer quadrant of left female breast: Secondary | ICD-10-CM | POA: Diagnosis not present

## 2024-02-27 DIAGNOSIS — Z1721 Progesterone receptor positive status: Secondary | ICD-10-CM | POA: Insufficient documentation

## 2024-02-27 DIAGNOSIS — C50812 Malignant neoplasm of overlapping sites of left female breast: Secondary | ICD-10-CM | POA: Insufficient documentation

## 2024-02-27 LAB — RAD ONC ARIA SESSION SUMMARY
Course Elapsed Days: 10
Plan Fractions Treated to Date: 9
Plan Prescribed Dose Per Fraction: 2.67 Gy
Plan Total Fractions Prescribed: 15
Plan Total Prescribed Dose: 40.05 Gy
Reference Point Dosage Given to Date: 24.03 Gy
Reference Point Session Dosage Given: 2.67 Gy
Session Number: 9

## 2024-02-28 ENCOUNTER — Other Ambulatory Visit: Payer: Self-pay

## 2024-02-28 ENCOUNTER — Ambulatory Visit
Admission: RE | Admit: 2024-02-28 | Discharge: 2024-02-28 | Disposition: A | Discharge status: Home / Self Care | Source: Ambulatory Visit | Attending: Radiation Oncology

## 2024-02-28 DIAGNOSIS — Z1721 Progesterone receptor positive status: Secondary | ICD-10-CM | POA: Diagnosis not present

## 2024-02-28 DIAGNOSIS — C50812 Malignant neoplasm of overlapping sites of left female breast: Secondary | ICD-10-CM | POA: Diagnosis not present

## 2024-02-28 DIAGNOSIS — Z170 Estrogen receptor positive status [ER+]: Secondary | ICD-10-CM | POA: Diagnosis not present

## 2024-02-28 DIAGNOSIS — C50412 Malignant neoplasm of upper-outer quadrant of left female breast: Secondary | ICD-10-CM | POA: Diagnosis not present

## 2024-02-28 DIAGNOSIS — Z1732 Human epidermal growth factor receptor 2 negative status: Secondary | ICD-10-CM | POA: Diagnosis not present

## 2024-02-28 DIAGNOSIS — Z510 Encounter for antineoplastic radiation therapy: Secondary | ICD-10-CM | POA: Diagnosis not present

## 2024-02-28 LAB — RAD ONC ARIA SESSION SUMMARY
Course Elapsed Days: 11
Plan Fractions Treated to Date: 10
Plan Prescribed Dose Per Fraction: 2.67 Gy
Plan Total Fractions Prescribed: 15
Plan Total Prescribed Dose: 40.05 Gy
Reference Point Dosage Given to Date: 26.7 Gy
Reference Point Session Dosage Given: 2.67 Gy
Session Number: 10

## 2024-03-02 ENCOUNTER — Ambulatory Visit: Admitting: Radiation Oncology

## 2024-03-02 ENCOUNTER — Other Ambulatory Visit: Payer: Self-pay

## 2024-03-02 ENCOUNTER — Ambulatory Visit
Admission: RE | Admit: 2024-03-02 | Discharge: 2024-03-02 | Disposition: A | Discharge status: Home / Self Care | Source: Ambulatory Visit | Attending: Radiation Oncology

## 2024-03-02 DIAGNOSIS — C50412 Malignant neoplasm of upper-outer quadrant of left female breast: Secondary | ICD-10-CM | POA: Diagnosis not present

## 2024-03-02 DIAGNOSIS — Z1732 Human epidermal growth factor receptor 2 negative status: Secondary | ICD-10-CM | POA: Diagnosis not present

## 2024-03-02 DIAGNOSIS — C50812 Malignant neoplasm of overlapping sites of left female breast: Secondary | ICD-10-CM | POA: Diagnosis not present

## 2024-03-02 DIAGNOSIS — Z170 Estrogen receptor positive status [ER+]: Secondary | ICD-10-CM | POA: Diagnosis not present

## 2024-03-02 DIAGNOSIS — Z1721 Progesterone receptor positive status: Secondary | ICD-10-CM | POA: Diagnosis not present

## 2024-03-02 DIAGNOSIS — Z510 Encounter for antineoplastic radiation therapy: Secondary | ICD-10-CM | POA: Diagnosis not present

## 2024-03-02 LAB — RAD ONC ARIA SESSION SUMMARY
Course Elapsed Days: 14
Plan Fractions Treated to Date: 11
Plan Prescribed Dose Per Fraction: 2.67 Gy
Plan Total Fractions Prescribed: 15
Plan Total Prescribed Dose: 40.05 Gy
Reference Point Dosage Given to Date: 29.37 Gy
Reference Point Session Dosage Given: 2.67 Gy
Session Number: 11

## 2024-03-03 ENCOUNTER — Ambulatory Visit
Admission: RE | Admit: 2024-03-03 | Discharge: 2024-03-03 | Disposition: A | Discharge status: Home / Self Care | Source: Ambulatory Visit | Attending: Radiation Oncology

## 2024-03-03 ENCOUNTER — Other Ambulatory Visit: Payer: Self-pay

## 2024-03-03 DIAGNOSIS — Z1721 Progesterone receptor positive status: Secondary | ICD-10-CM | POA: Diagnosis not present

## 2024-03-03 DIAGNOSIS — Z170 Estrogen receptor positive status [ER+]: Secondary | ICD-10-CM | POA: Diagnosis not present

## 2024-03-03 DIAGNOSIS — C50812 Malignant neoplasm of overlapping sites of left female breast: Secondary | ICD-10-CM | POA: Diagnosis not present

## 2024-03-03 DIAGNOSIS — Z510 Encounter for antineoplastic radiation therapy: Secondary | ICD-10-CM | POA: Diagnosis not present

## 2024-03-03 DIAGNOSIS — Z1732 Human epidermal growth factor receptor 2 negative status: Secondary | ICD-10-CM | POA: Diagnosis not present

## 2024-03-03 DIAGNOSIS — C50412 Malignant neoplasm of upper-outer quadrant of left female breast: Secondary | ICD-10-CM | POA: Diagnosis not present

## 2024-03-03 LAB — RAD ONC ARIA SESSION SUMMARY
Course Elapsed Days: 15
Plan Fractions Treated to Date: 12
Plan Prescribed Dose Per Fraction: 2.67 Gy
Plan Total Fractions Prescribed: 15
Plan Total Prescribed Dose: 40.05 Gy
Reference Point Dosage Given to Date: 32.04 Gy
Reference Point Session Dosage Given: 2.67 Gy
Session Number: 12

## 2024-03-04 ENCOUNTER — Other Ambulatory Visit: Payer: Self-pay

## 2024-03-04 ENCOUNTER — Ambulatory Visit
Admission: RE | Admit: 2024-03-04 | Discharge: 2024-03-04 | Disposition: A | Discharge status: Home / Self Care | Source: Ambulatory Visit | Attending: Radiation Oncology | Admitting: Radiation Oncology

## 2024-03-04 DIAGNOSIS — Z510 Encounter for antineoplastic radiation therapy: Secondary | ICD-10-CM | POA: Diagnosis not present

## 2024-03-04 DIAGNOSIS — C50412 Malignant neoplasm of upper-outer quadrant of left female breast: Secondary | ICD-10-CM | POA: Diagnosis not present

## 2024-03-04 DIAGNOSIS — Z1721 Progesterone receptor positive status: Secondary | ICD-10-CM | POA: Diagnosis not present

## 2024-03-04 DIAGNOSIS — Z170 Estrogen receptor positive status [ER+]: Secondary | ICD-10-CM | POA: Diagnosis not present

## 2024-03-04 DIAGNOSIS — Z1732 Human epidermal growth factor receptor 2 negative status: Secondary | ICD-10-CM | POA: Diagnosis not present

## 2024-03-04 DIAGNOSIS — C50812 Malignant neoplasm of overlapping sites of left female breast: Secondary | ICD-10-CM | POA: Diagnosis not present

## 2024-03-04 LAB — RAD ONC ARIA SESSION SUMMARY
Course Elapsed Days: 16
Plan Fractions Treated to Date: 13
Plan Prescribed Dose Per Fraction: 2.67 Gy
Plan Total Fractions Prescribed: 15
Plan Total Prescribed Dose: 40.05 Gy
Reference Point Dosage Given to Date: 34.71 Gy
Reference Point Session Dosage Given: 2.67 Gy
Session Number: 13

## 2024-03-05 ENCOUNTER — Ambulatory Visit
Admission: RE | Admit: 2024-03-05 | Discharge: 2024-03-05 | Disposition: A | Discharge status: Home / Self Care | Source: Ambulatory Visit | Attending: Radiation Oncology | Admitting: Radiation Oncology

## 2024-03-05 ENCOUNTER — Other Ambulatory Visit: Payer: Self-pay

## 2024-03-05 DIAGNOSIS — Z170 Estrogen receptor positive status [ER+]: Secondary | ICD-10-CM | POA: Diagnosis not present

## 2024-03-05 DIAGNOSIS — Z1732 Human epidermal growth factor receptor 2 negative status: Secondary | ICD-10-CM | POA: Diagnosis not present

## 2024-03-05 DIAGNOSIS — Z1721 Progesterone receptor positive status: Secondary | ICD-10-CM | POA: Diagnosis not present

## 2024-03-05 DIAGNOSIS — C50812 Malignant neoplasm of overlapping sites of left female breast: Secondary | ICD-10-CM | POA: Diagnosis not present

## 2024-03-05 DIAGNOSIS — C50412 Malignant neoplasm of upper-outer quadrant of left female breast: Secondary | ICD-10-CM | POA: Diagnosis not present

## 2024-03-05 DIAGNOSIS — Z510 Encounter for antineoplastic radiation therapy: Secondary | ICD-10-CM | POA: Diagnosis not present

## 2024-03-05 LAB — RAD ONC ARIA SESSION SUMMARY
Course Elapsed Days: 17
Plan Fractions Treated to Date: 14
Plan Prescribed Dose Per Fraction: 2.67 Gy
Plan Total Fractions Prescribed: 15
Plan Total Prescribed Dose: 40.05 Gy
Reference Point Dosage Given to Date: 37.38 Gy
Reference Point Session Dosage Given: 2.67 Gy
Session Number: 14

## 2024-03-06 ENCOUNTER — Other Ambulatory Visit: Payer: Self-pay

## 2024-03-06 ENCOUNTER — Ambulatory Visit
Admission: RE | Admit: 2024-03-06 | Discharge: 2024-03-06 | Disposition: A | Discharge status: Home / Self Care | Source: Ambulatory Visit | Attending: Radiation Oncology | Admitting: Radiation Oncology

## 2024-03-06 DIAGNOSIS — Z1721 Progesterone receptor positive status: Secondary | ICD-10-CM | POA: Diagnosis not present

## 2024-03-06 DIAGNOSIS — Z170 Estrogen receptor positive status [ER+]: Secondary | ICD-10-CM | POA: Diagnosis not present

## 2024-03-06 DIAGNOSIS — C50812 Malignant neoplasm of overlapping sites of left female breast: Secondary | ICD-10-CM | POA: Diagnosis not present

## 2024-03-06 DIAGNOSIS — Z1732 Human epidermal growth factor receptor 2 negative status: Secondary | ICD-10-CM | POA: Diagnosis not present

## 2024-03-06 DIAGNOSIS — Z510 Encounter for antineoplastic radiation therapy: Secondary | ICD-10-CM | POA: Diagnosis not present

## 2024-03-06 DIAGNOSIS — C50412 Malignant neoplasm of upper-outer quadrant of left female breast: Secondary | ICD-10-CM | POA: Diagnosis not present

## 2024-03-06 LAB — RAD ONC ARIA SESSION SUMMARY
Course Elapsed Days: 18
Plan Fractions Treated to Date: 15
Plan Prescribed Dose Per Fraction: 2.67 Gy
Plan Total Fractions Prescribed: 15
Plan Total Prescribed Dose: 40.05 Gy
Reference Point Dosage Given to Date: 40.05 Gy
Reference Point Session Dosage Given: 2.67 Gy
Session Number: 15

## 2024-03-09 ENCOUNTER — Other Ambulatory Visit: Payer: Self-pay

## 2024-03-09 ENCOUNTER — Ambulatory Visit
Admission: RE | Admit: 2024-03-09 | Discharge: 2024-03-09 | Disposition: A | Discharge status: Home / Self Care | Source: Ambulatory Visit | Attending: Radiation Oncology

## 2024-03-09 DIAGNOSIS — Z170 Estrogen receptor positive status [ER+]: Secondary | ICD-10-CM | POA: Diagnosis not present

## 2024-03-09 DIAGNOSIS — Z1732 Human epidermal growth factor receptor 2 negative status: Secondary | ICD-10-CM | POA: Diagnosis not present

## 2024-03-09 DIAGNOSIS — Z510 Encounter for antineoplastic radiation therapy: Secondary | ICD-10-CM | POA: Diagnosis not present

## 2024-03-09 DIAGNOSIS — Z1721 Progesterone receptor positive status: Secondary | ICD-10-CM | POA: Diagnosis not present

## 2024-03-09 DIAGNOSIS — C50412 Malignant neoplasm of upper-outer quadrant of left female breast: Secondary | ICD-10-CM | POA: Diagnosis not present

## 2024-03-09 DIAGNOSIS — C50812 Malignant neoplasm of overlapping sites of left female breast: Secondary | ICD-10-CM | POA: Diagnosis not present

## 2024-03-09 LAB — RAD ONC ARIA SESSION SUMMARY
Course Elapsed Days: 21
Plan Fractions Treated to Date: 1
Plan Prescribed Dose Per Fraction: 2 Gy
Plan Total Fractions Prescribed: 5
Plan Total Prescribed Dose: 10 Gy
Reference Point Dosage Given to Date: 2 Gy
Reference Point Session Dosage Given: 2 Gy
Session Number: 16

## 2024-03-10 ENCOUNTER — Other Ambulatory Visit: Payer: Self-pay

## 2024-03-10 ENCOUNTER — Ambulatory Visit
Admission: RE | Admit: 2024-03-10 | Discharge: 2024-03-10 | Disposition: A | Discharge status: Home / Self Care | Source: Ambulatory Visit | Attending: Radiation Oncology | Admitting: Radiation Oncology

## 2024-03-10 DIAGNOSIS — Z170 Estrogen receptor positive status [ER+]: Secondary | ICD-10-CM | POA: Diagnosis not present

## 2024-03-10 DIAGNOSIS — Z1721 Progesterone receptor positive status: Secondary | ICD-10-CM | POA: Diagnosis not present

## 2024-03-10 DIAGNOSIS — Z1732 Human epidermal growth factor receptor 2 negative status: Secondary | ICD-10-CM | POA: Diagnosis not present

## 2024-03-10 DIAGNOSIS — C50812 Malignant neoplasm of overlapping sites of left female breast: Secondary | ICD-10-CM | POA: Diagnosis not present

## 2024-03-10 DIAGNOSIS — Z510 Encounter for antineoplastic radiation therapy: Secondary | ICD-10-CM | POA: Diagnosis not present

## 2024-03-10 LAB — RAD ONC ARIA SESSION SUMMARY
Course Elapsed Days: 22
Plan Fractions Treated to Date: 2
Plan Prescribed Dose Per Fraction: 2 Gy
Plan Total Fractions Prescribed: 5
Plan Total Prescribed Dose: 10 Gy
Reference Point Dosage Given to Date: 4 Gy
Reference Point Session Dosage Given: 2 Gy
Session Number: 17

## 2024-03-11 ENCOUNTER — Other Ambulatory Visit: Payer: Self-pay

## 2024-03-11 ENCOUNTER — Ambulatory Visit
Admission: RE | Admit: 2024-03-11 | Discharge: 2024-03-11 | Disposition: A | Discharge status: Home / Self Care | Source: Ambulatory Visit | Attending: Radiation Oncology | Admitting: Radiation Oncology

## 2024-03-11 DIAGNOSIS — Z1721 Progesterone receptor positive status: Secondary | ICD-10-CM | POA: Diagnosis not present

## 2024-03-11 DIAGNOSIS — Z1732 Human epidermal growth factor receptor 2 negative status: Secondary | ICD-10-CM | POA: Diagnosis not present

## 2024-03-11 DIAGNOSIS — Z170 Estrogen receptor positive status [ER+]: Secondary | ICD-10-CM | POA: Diagnosis not present

## 2024-03-11 DIAGNOSIS — Z510 Encounter for antineoplastic radiation therapy: Secondary | ICD-10-CM | POA: Diagnosis not present

## 2024-03-11 DIAGNOSIS — C50812 Malignant neoplasm of overlapping sites of left female breast: Secondary | ICD-10-CM | POA: Diagnosis not present

## 2024-03-11 LAB — RAD ONC ARIA SESSION SUMMARY
Course Elapsed Days: 23
Plan Fractions Treated to Date: 3
Plan Prescribed Dose Per Fraction: 2 Gy
Plan Total Fractions Prescribed: 5
Plan Total Prescribed Dose: 10 Gy
Reference Point Dosage Given to Date: 6 Gy
Reference Point Session Dosage Given: 2 Gy
Session Number: 18

## 2024-03-12 ENCOUNTER — Other Ambulatory Visit: Payer: Self-pay

## 2024-03-12 ENCOUNTER — Ambulatory Visit
Admission: RE | Admit: 2024-03-12 | Discharge: 2024-03-12 | Disposition: A | Discharge status: Home / Self Care | Source: Ambulatory Visit | Attending: Radiation Oncology

## 2024-03-12 DIAGNOSIS — C50812 Malignant neoplasm of overlapping sites of left female breast: Secondary | ICD-10-CM | POA: Diagnosis not present

## 2024-03-12 DIAGNOSIS — Z1721 Progesterone receptor positive status: Secondary | ICD-10-CM | POA: Diagnosis not present

## 2024-03-12 DIAGNOSIS — Z510 Encounter for antineoplastic radiation therapy: Secondary | ICD-10-CM | POA: Diagnosis not present

## 2024-03-12 DIAGNOSIS — Z1732 Human epidermal growth factor receptor 2 negative status: Secondary | ICD-10-CM | POA: Diagnosis not present

## 2024-03-12 DIAGNOSIS — Z170 Estrogen receptor positive status [ER+]: Secondary | ICD-10-CM | POA: Diagnosis not present

## 2024-03-12 LAB — RAD ONC ARIA SESSION SUMMARY
Course Elapsed Days: 24
Plan Fractions Treated to Date: 4
Plan Prescribed Dose Per Fraction: 2 Gy
Plan Total Fractions Prescribed: 5
Plan Total Prescribed Dose: 10 Gy
Reference Point Dosage Given to Date: 8 Gy
Reference Point Session Dosage Given: 2 Gy
Session Number: 19

## 2024-03-13 ENCOUNTER — Ambulatory Visit
Admission: RE | Admit: 2024-03-13 | Discharge: 2024-03-13 | Disposition: A | Discharge status: Home / Self Care | Source: Ambulatory Visit | Attending: Radiation Oncology | Admitting: Radiation Oncology

## 2024-03-13 ENCOUNTER — Telehealth: Payer: Self-pay | Admitting: *Deleted

## 2024-03-13 ENCOUNTER — Other Ambulatory Visit: Payer: Self-pay

## 2024-03-13 DIAGNOSIS — Z170 Estrogen receptor positive status [ER+]: Secondary | ICD-10-CM | POA: Diagnosis not present

## 2024-03-13 DIAGNOSIS — Z1721 Progesterone receptor positive status: Secondary | ICD-10-CM | POA: Diagnosis not present

## 2024-03-13 DIAGNOSIS — Z1732 Human epidermal growth factor receptor 2 negative status: Secondary | ICD-10-CM | POA: Diagnosis not present

## 2024-03-13 DIAGNOSIS — C50812 Malignant neoplasm of overlapping sites of left female breast: Secondary | ICD-10-CM | POA: Diagnosis not present

## 2024-03-13 DIAGNOSIS — Z510 Encounter for antineoplastic radiation therapy: Secondary | ICD-10-CM | POA: Diagnosis not present

## 2024-03-13 LAB — RAD ONC ARIA SESSION SUMMARY
Course Elapsed Days: 25
Plan Fractions Treated to Date: 5
Plan Prescribed Dose Per Fraction: 2 Gy
Plan Total Fractions Prescribed: 5
Plan Total Prescribed Dose: 10 Gy
Reference Point Dosage Given to Date: 10 Gy
Reference Point Session Dosage Given: 2 Gy
Session Number: 20

## 2024-03-13 NOTE — Telephone Encounter (Signed)
 Spoke with Ms. Morgan Frye who called the office to schedule a 6 month follow up with Dr. Orvil Bland. Pt was given an appt. On Friday, May 23 rd at 2 pm. Pt agreed to date and time and had no further concerns at this time.

## 2024-03-16 NOTE — Radiation Completion Notes (Signed)
 Patient Name: Morgan Frye, Morgan Frye MRN: [id number] Date of Birth: Jun 07, 1951 Referring Physician: Murleen Arms, M.D. Date of Service: 2024-03-16 Radiation Oncologist: Colie Dawes, M.D. [facility]                             RADIATION ONCOLOGY END OF TREATMENT NOTE     Diagnosis: C50.412 Malignant neoplasm of upper-outer quadrant of left female breast Staging on 2023-12-25: Malignant 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, estrogen receptor positive (HCC) T=cT1c, N=cN0, M=cM0 Intent: Curative     ==========DELIVERED PLANS==========  First Treatment Date: 2024-02-17 Last Treatment Date: 2024-03-13   Plan Name: Breast_L_BH Site: Breast, Left Technique: 3D Mode: Photon Dose Per Fraction: 2.67 Gy Prescribed Dose (Delivered / Prescribed): 40.05 Gy / 40.05 Gy Prescribed Fxs (Delivered / Prescribed): 15 / 15   Plan Name: Brst_L_Bst_BH Site: Breast, Left Technique: 3D Mode: Photon Dose Per Fraction: 2 Gy Prescribed Dose (Delivered / Prescribed): 10 Gy / 10 Gy Prescribed Fxs (Delivered / Prescribed): 5 / 5     ==========ON TREATMENT VISIT DATES========== 2024-02-17, 2024-02-24, 2024-03-02, 2024-03-09     ==========UPCOMING VISITS==========       ==========APPENDIX - ON TREATMENT VISIT NOTES==========   See weekly On Treatment Notes in Epic for details in the Media tab (listed as Progress notes on the On Treatment Visit Dates listed above).

## 2024-03-20 ENCOUNTER — Inpatient Hospital Stay: Attending: Hematology and Oncology | Admitting: Gynecologic Oncology

## 2024-03-20 ENCOUNTER — Encounter: Payer: Self-pay | Admitting: Gynecologic Oncology

## 2024-03-20 VITALS — BP 124/60 | HR 76 | Temp 97.8°F | Resp 16 | Ht 65.0 in | Wt 226.8 lb

## 2024-03-20 DIAGNOSIS — Z90722 Acquired absence of ovaries, bilateral: Secondary | ICD-10-CM | POA: Diagnosis not present

## 2024-03-20 DIAGNOSIS — Z8542 Personal history of malignant neoplasm of other parts of uterus: Secondary | ICD-10-CM | POA: Insufficient documentation

## 2024-03-20 DIAGNOSIS — D699 Hemorrhagic condition, unspecified: Secondary | ICD-10-CM | POA: Insufficient documentation

## 2024-03-20 DIAGNOSIS — C50812 Malignant neoplasm of overlapping sites of left female breast: Secondary | ICD-10-CM | POA: Insufficient documentation

## 2024-03-20 DIAGNOSIS — C541 Malignant neoplasm of endometrium: Secondary | ICD-10-CM | POA: Diagnosis not present

## 2024-03-20 DIAGNOSIS — Z90710 Acquired absence of both cervix and uterus: Secondary | ICD-10-CM | POA: Diagnosis not present

## 2024-03-20 DIAGNOSIS — Z9079 Acquired absence of other genital organ(s): Secondary | ICD-10-CM | POA: Insufficient documentation

## 2024-03-20 NOTE — Patient Instructions (Signed)
 It was good to see you today.  I do not see or feel any evidence of cancer recurrence on your exam.  Please reach out to your OB/GYN's office to schedule a follow-up visit in 6 months.  We will see you for follow-up in 12 months.  As always, if you develop any new and concerning symptoms before your next visit, please call to see me sooner.

## 2024-03-20 NOTE — Progress Notes (Signed)
 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 No evidence of metastatic disease in the abdomen or pelvis. No obvious endometrial or cervical mass demonstrated. 2. 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 3. Cholelithiasis without evidence of cholecystitis. 4. Nonobstructing bilateral renal calculi. 5.  Aortic Atherosclerosis (ICD10-I70.   10/03/2022 Surgery   Robotic-assisted laparoscopic total hysterectomy with bilateral salpingoophorectomy, SLN biopsy bilaterally    Findings:  On EUA, 8 cm mobile uterus. Small cervix flush with the vaginal apex. On intra-abdominal entry, normal upper abdominal survey. Normal small and large bowel. Multiple fecaliths within the pelvis (removed). Uterus 8cm and normal in appearance although posterior serosa tore very easily with any sort of manipulation concerning for possible deeper myometrial invasion. Bilateral ovaries and tubes normal in appearance. SLN mapping successful to two pelvic lymph nodes bilaterally. Some fibrosis of the retroperitoneum, more on the left than right. Dense adhesions between the bladder and LUS/cervix.     10/03/2022 Pathology Results   A. LYMPH NODE, SENTINEL, RIGHT OBTURATOR, BIOPSY: - Negative for carcinoma (0/1)  B. LYMPH NODE, SENTINEL, RIGHT EXTERNAL ILIAC, BIOPSY: - Negative for carcinoma (0/1)  C. LYMPH NODE, SENTINEL, LEFT OBTURATOR, BIOPSY: - Negative for carcinoma (0/1)  D. LYMPH NODE, SENTINEL, LEFT OBTURATOR AND ADJACENT NON GREEN LYMPH NODES: - Negative for carcinoma (0/1)  E. UTERUS, CERVIX,  BILATERAL FALLOPIAN TUBES AND OVARIES: - Endometrioid carcinoma, FIGO grade 1, pT1a - Myometrial invasion less than 50% - No lymphovascular invasion identified - Cervix: Benign, nabothian cyst - Bilateral fallopian tubes: Benign, paratubal cysts - Bilateral ovaries: No significant pathologic changes - See oncology table  ONCOLOGY TABLE:   UTERUS, CARCINOMA OR CARCINOSARCOMA: Resection  Procedure: Total hysterectomy and bilateral salpingo-oophorectomy Histologic Type: Endometrioid adenocarcinoma Histologic Grade: FIGO grade 1 Myometrial Invasion:      Depth of Myometrial Invasion (mm): 3 mm      Myometrial Thickness (mm): 15 mm      Percentage of Myometrial Invasion: 20% Uterine Serosa Involvement: Not identified Cervical stromal Involvement: Not identified Extent of involvement of other tissue/organs: Not applicable Peritoneal/Ascitic Fluid: Not submitted Lymphovascular Invasion: Not identified Regional Lymph Nodes:      Pelvic Lymph Nodes Examined: 4 Sentinel                               0 non-sentinel                                  4: total      Pelvic Lymph Nodes with Metastasis: 0 Distant Metastasis:      Distant Site(s) Involved: Not applicable Pathologic Stage Classification (pTNM, AJCC 8th Edition): pT1a, pN0 Ancillary Studies: MMR / MSI testing will be ordered Representative Tumor Block: E8 Comment(s): Appropriately controlled p53 immunohistochemical stain shows wild-type staining - Pancytokeratin stain was performed on the lymph nodes and is negative.     Genetic Testing   Negative genetic testing. No pathogenic variants identified on the [facility] Common Hereditary Cancers+RNA panel. The report date is 11/23/2022.  The Common Hereditary Cancers Panel + RNA offered by [facility] includes sequencing and/or deletion duplication testing of the following 48 genes: APC*, ATM*, AXIN2, BAP1, BARD1, BMPR1A, BRCA1, BRCA2, BRIP1, CDH1, CDK4, CDKN2A (p14ARF), CDKN2A (p16INK4a),  CHEK2, CTNNA1, DICER1*, EPCAM*, FH*, GREM1*, HOXB13, KIT, MBD4, MEN1*, MLH1*, MSH2*, MSH3*, MSH6*, MUTYH, NF1*, NTHL1, PALB2, PDGFRA, PMS2*, POLD1*, POLE, PTEN*, RAD51C, RAD51D, SDHA*, SDHB, SDHC*, SDHD, SMAD4, SMARCA4, STK11, TP53, TSC1*, TSC2, VHL.    Malignant 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, estrogen receptor positive (HCC)  12/11/2023 Mammogram   She had a mammogram in February 2025 and this showed a 1.1 cm irregular mass in the left breast at 3:00 posterior depth, 1 cm irregular mass in the left breast at 3:00 middle to posterior depth, the 2 masses span 3.5 cm anteroposteriorly.   12/18/2023 Pathology Results   She has had 3 biopsies of the left breast, at 3:00, 8 cm from the nipple, 9 cm from the centimeters from the nipple.  Prognostics from the 10 cm from the nipple showed ER 99% positive strong staining, PR 70% weak to strong staining, Ki-67 20% HER2 1+.  Prognostics from the 3:00 mass at 9 cm from nipple once again showed ER 100% positive strong staining PR 90% positive to strong staining HER2 1+ prognostics from the left breast needle biopsy at 3:00 8 cm from the nipple also showed ER 100% positive strong staining PR 75% positive weak to strong staining.   12/23/2023 Initial Diagnosis   Malignant 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, estrogen receptor positive (HCC)   12/25/2023 Cancer Staging   Staging form: Breast, AJCC 8th Edition - Clinical stage from 12/25/2023: Stage IA (cT1c, cN0, cM0, G2, ER+, PR+, HER2-) - Signed by Murleen Arms, MD on 12/25/2023 Stage prefix: Initial diagnosis Histologic grading system: 3 grade system     Interval History: She was diagnosed with breast cancer since last time I saw her.  She underwent surgery followed by radiation at she completed last week.  Plan is for her to start an aromatase inhibitor.  She denies any vaginal bleeding.  Reports baseline bowel bladder function.  Denies any abdominal or pelvic  pain.  Past Medical/Surgical History: Past Medical History:  Diagnosis Date   Arthritis    osteoarthritis- knees, hips, rt. hip bursitis   Blood dyscrasia    bruises easily and bleeds easy after surgery   Breast cancer (HCC) 11/2023   left breast ILV   Diabetes mellitus type 2 in obese    Hemangioma of liver    very small- evaluated and considered stable- no further problems or follow up in many years   History of kidney stones    x1 lithotripsy-has others not a bother at this time   History of palpitations    during menopause, on metoprolol . Saw cardiologist   Hypertension    Obesity (BMI 30-39.9)    Transfusion history    '1981- s/p childbirth    Past Surgical History:  Procedure Laterality Date   ABDOMINAL HYSTERECTOMY     BREAST LUMPECTOMY WITH RADIOACTIVE SEED AND SENTINEL LYMPH NODE BIOPSY Left 01/02/2024   Procedure: LEFT BREAST SEED BRACKETED LUMPECTOMY, LEFT SENTINEL LYMPH NODE MAPPING;  Surgeon: Sim Dryer, MD;  Location: [facility];  Service: General;  Laterality: Left;  PEC BLOCK   CATARACT EXTRACTION, BILATERAL Bilateral    5 yrs ago   DILATION AND CURETTAGE OF UTERUS  2010   perimenopause, caused bleeding, had polyp removed  LITHOTRIPSY     palpitations     had durning menopause, saw cards and given metoprolol    ROBOTIC ASSISTED TOTAL HYSTERECTOMY WITH BILATERAL SALPINGO OOPHERECTOMY N/A 10/03/2022   Procedure: XI ROBOTIC ASSISTED TOTAL HYSTERECTOMY WITH BILATERAL SALPINGO OOPHORECTOMY;  Surgeon: Suzi Essex, MD;  Location: [facility];  Service: Gynecology;  Laterality: N/A;   SENTINEL NODE BIOPSY N/A 10/03/2022   Procedure: SENTINEL NODE BIOPSY;  Surgeon: Suzi Essex, MD;  Location: [facility];  Service: Gynecology;  Laterality: N/A;   TONSILLECTOMY     child   TOTAL KNEE ARTHROPLASTY Right 05/24/2014   Procedure: RIGHT TOTAL KNEE ARTHROPLASTY;  Surgeon: Aurther Blue, MD;  Location: [facility];  Service: Orthopedics;  Laterality:  Right;   TOTAL KNEE ARTHROPLASTY Left 05/30/2015   Procedure: LEFT TOTAL KNEE ARTHROPLASTY;  Surgeon: Liliane Rei, MD;  Location: [facility];  Service: Orthopedics;  Laterality: Left;    Family History  Problem Relation Age of Onset   Liver disease Mother    Heart disease Father        ICD.  Father is 67   Prostate cancer Father 48   Colon cancer Neg Hx    Breast cancer Neg Hx    Ovarian cancer Neg Hx    Endometrial cancer Neg Hx    Pancreatic cancer Neg Hx     Social History   Socioeconomic History   Marital status: Married    Spouse name: Not on file   Number of children: 2   Years of education: Not on file   Highest education level: Not on file  Occupational History   Occupation: retired  Tobacco Use   Smoking status: Never   Smokeless tobacco: Never  Vaping Use   Vaping status: Never Used  Substance and Sexual Activity   Alcohol use: Never   Drug use: No   Sexual activity: Yes  Other Topics Concern   Not on file  Social History Narrative   Lives with husband.     Social Drivers of Corporate investment banker Strain: Low Risk  (09/13/2022)   Overall Financial Resource Strain (CARDIA)    Difficulty of Paying Living Expenses: Not hard at all  Food Insecurity: No Food Insecurity (01/30/2024)   Hunger Vital Sign    Worried About Running Out of Food in the Last Year: Never true    Ran Out of Food in the Last Year: Never true  Transportation Needs: No Transportation Needs (01/30/2024)   PRAPARE - Administrator, Civil Service (Medical): No    Lack of Transportation (Non-Medical): No  Physical Activity: Not on file  Stress: Not on file  Social Connections: Not on file    Current Medications:  Current Outpatient Medications:    Accu-Chek FastClix Lancets MISC, Apply topically daily., Disp: , Rfl:    ACCU-CHEK GUIDE test strip, daily., Disp: , Rfl:    atorvastatin  (LIPITOR) 20 MG tablet, Take 20 mg by mouth at bedtime., Disp: , Rfl:    Calcium   Carb-Cholecalciferol (CALCIUM  + VITAMIN D3 PO), Take 1 tablet by mouth every evening., Disp: , Rfl:    cetirizine (ZYRTEC) 10 MG tablet, Take 10 mg by mouth daily., Disp: , Rfl:    Cholecalciferol (VITAMIN D3) 50 MCG (2000 UT) CAPS, Take 2,000 Units by mouth daily., Disp: , Rfl:    ciclopirox (LOPROX) 0.77 % cream, Apply 1 application  topically 2 (two) times daily as needed (yeast under breasts)., Disp: , Rfl:    lisinopril-hydrochlorothiazide (  PRINZIDE,ZESTORETIC) 20-12.5 MG per tablet, Take 1 tablet by mouth every morning., Disp: , Rfl:    metFORMIN (GLUCOPHAGE-XR) 500 MG 24 hr tablet, Take 500 mg by mouth every evening., Disp: , Rfl:    metoprolol  succinate (TOPROL -XL) 50 MG 24 hr tablet, Take 50 mg by mouth every evening. Take with or immediately following a meal., Disp: , Rfl:    Multiple Vitamin (MULTIVITAMIN) tablet, Take 1 tablet by mouth daily., Disp: , Rfl:   Review of Systems: + fatigue Denies appetite changes, fevers, chills, unexplained weight changes. Denies hearing loss, neck lumps or masses, mouth sores, ringing in ears or voice changes. Denies cough or wheezing.  Denies shortness of breath. Denies chest pain or palpitations. Denies leg swelling. Denies abdominal distention, pain, blood in stools, constipation, diarrhea, nausea, vomiting, or early satiety. Denies pain with intercourse, dysuria, frequency, hematuria or incontinence. Denies hot flashes, pelvic pain, vaginal bleeding or vaginal discharge.   Denies joint pain, back pain or muscle pain/cramps. Denies itching, rash, or wounds. Denies dizziness, headaches, numbness or seizures. Denies swollen lymph nodes or glands, denies easy bruising or bleeding. Denies anxiety, depression, confusion, or decreased concentration.  Physical Exam: BP 124/60 (BP Location: Right Arm, Patient Position: Sitting)   Pulse 76   Temp 97.8 F (36.6 C) (Oral)   Resp 16   Ht 5\' 5"  (1.651 m)   Wt 226 lb 12.8 oz (102.9 kg)   SpO2 95%    BMI 37.74 kg/m  General: Alert, oriented, no acute distress. HEENT: Atraumatic, normocephalic, sclera anicteric. Chest: Clear to auscultation bilaterally.  No wheezes or rhonchi. Cardiovascular: Regular rate and rhythm, no murmurs. Abdomen: Obese, soft, nontender.  Normoactive bowel sounds.  No masses or hepatosplenomegaly appreciated.  Well-healed incisions. Extremities: Grossly normal range of motion.  Warm, well perfused.  No edema bilaterally. Skin: No rashes or lesions noted. Lymphatics: No cervical, supraclavicular, or inguinal adenopathy. GU: Normal appearing external genitalia without erythema, excoriation, or lesions.  Speculum exam reveals mildly atrophic vaginal mucosa, stable from last exam.  No atypical vascularity or lesions noted.  Cuff is intact with minimal adhesions.  Bimanual exam reveals cuff intact, no nodularity or masses.  Rectovaginal exam confirms findings.  Laboratory & Radiologic Studies: None new  Assessment & Plan: Morgan Frye is a 73 y.o. 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 MLH1 promoter hypermethylation testing is absent.  Surgery 09/2022. Germline genetic testing.   The patient is doing well and is NED on exam today.   Per NCCN surveillance recommendations, we will plan on surveillance visits every 6 months for 5 years and then yearly after that.  We will plan to alternate visits between my office and her OB/GYN.  I have asked her to call her OB/GYN to schedule visit in 6 months.  I will plan to see her back in a year.  We reviewed signs and symptoms that would be concerning for cancer recurrence.  I stressed the importance of calling if she develops any of these.  20 minutes of total time was spent for this patient encounter, including preparation, face-to-face counseling with the patient and coordination of care, and documentation of the  encounter.  Wiley Hanger, MD  Division of Gynecologic Oncology  Department of Obstetrics and Gynecology  [facility]

## 2024-03-25 ENCOUNTER — Telehealth: Payer: Self-pay

## 2024-03-25 NOTE — Telephone Encounter (Signed)
 Verbally confirmed appointment for 5/29

## 2024-03-26 ENCOUNTER — Inpatient Hospital Stay: Admitting: Hematology and Oncology

## 2024-03-26 ENCOUNTER — Inpatient Hospital Stay

## 2024-03-26 VITALS — BP 134/65 | HR 84 | Temp 97.4°F | Resp 18 | Wt 227.2 lb

## 2024-03-26 DIAGNOSIS — C50812 Malignant neoplasm of overlapping sites of left female breast: Secondary | ICD-10-CM | POA: Diagnosis not present

## 2024-03-26 DIAGNOSIS — Z170 Estrogen receptor positive status [ER+]: Secondary | ICD-10-CM

## 2024-03-26 DIAGNOSIS — D699 Hemorrhagic condition, unspecified: Secondary | ICD-10-CM

## 2024-03-26 DIAGNOSIS — Z9079 Acquired absence of other genital organ(s): Secondary | ICD-10-CM | POA: Diagnosis not present

## 2024-03-26 DIAGNOSIS — Z90710 Acquired absence of both cervix and uterus: Secondary | ICD-10-CM | POA: Diagnosis not present

## 2024-03-26 DIAGNOSIS — Z90722 Acquired absence of ovaries, bilateral: Secondary | ICD-10-CM | POA: Diagnosis not present

## 2024-03-26 DIAGNOSIS — Z8542 Personal history of malignant neoplasm of other parts of uterus: Secondary | ICD-10-CM | POA: Diagnosis not present

## 2024-03-26 LAB — CBC WITH DIFFERENTIAL/PLATELET
Abs Immature Granulocytes: 0.01 10*3/uL (ref 0.00–0.07)
Basophils Absolute: 0 10*3/uL (ref 0.0–0.1)
Basophils Relative: 1 %
Eosinophils Absolute: 0.2 10*3/uL (ref 0.0–0.5)
Eosinophils Relative: 4 %
HCT: 42.6 % (ref 36.0–46.0)
Hemoglobin: 15.4 g/dL — ABNORMAL HIGH (ref 12.0–15.0)
Immature Granulocytes: 0 %
Lymphocytes Relative: 17 %
Lymphs Abs: 0.7 10*3/uL (ref 0.7–4.0)
MCH: 32.3 pg (ref 26.0–34.0)
MCHC: 36.2 g/dL — ABNORMAL HIGH (ref 30.0–36.0)
MCV: 89.3 fL (ref 80.0–100.0)
Monocytes Absolute: 0.3 10*3/uL (ref 0.1–1.0)
Monocytes Relative: 8 %
Neutro Abs: 2.9 10*3/uL (ref 1.7–7.7)
Neutrophils Relative %: 70 %
Platelets: 125 10*3/uL — ABNORMAL LOW (ref 150–400)
RBC: 4.77 MIL/uL (ref 3.87–5.11)
RDW: 12.9 % (ref 11.5–15.5)
WBC: 4.2 10*3/uL (ref 4.0–10.5)
nRBC: 0 % (ref 0.0–0.2)

## 2024-03-26 LAB — PROTIME-INR
INR: 1.3 — ABNORMAL HIGH (ref 0.8–1.2)
Prothrombin Time: 16.7 s — ABNORMAL HIGH (ref 11.4–15.2)

## 2024-03-26 LAB — PLATELET FUNCTION ASSAY: Collagen / Epinephrine: 134 s (ref 0–193)

## 2024-03-26 LAB — APTT: aPTT: 29 s (ref 24–36)

## 2024-03-26 MED ORDER — ANASTROZOLE 1 MG PO TABS: 1.0000 mg | ORAL_TABLET | Freq: Every day | ORAL | 3 refills | Status: AC

## 2024-03-26 NOTE — Progress Notes (Signed)
 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 All margins are negative, and the sentinel lymph node is clear. The oncotype score is 13, indicating that chemotherapy is not necessary. The cancer is estrogen-receptor positive, and the plan is to proceed with radiation therapy followed by antiestrogen therapy. Radiation therapy is planned for 4-6 weeks, 5 days a week, with each session lasting few minutes. Anastrozole  therapy is recommended for a minimum of 7 years due to lobular histology, as lobular cancers rely heavily on estrogen for growth.  - She is now s/p adjuvant radiation, completed 03/13/2024.  Assessment and Plan Assessment & Plan Invasive lobular breast cancer Post-radiation therapy , tolerated it well. Anastrozole  recommended to reduce estrogen levels and prevent recurrence. Bone density monitoring essential due to potential negative effects on bone health. - Prescribed anastrozole  and sent prescription to pharmacy on [address]. - Ordered bone density screening at [facility] within the next couple of months. - Encouraged weight-bearing exercises, vitamin D, and calcium  supplements for bone health. - Scheduled follow-up visit in three months for survivorship visit to assess long-term effects and tolerance to anastrozole .  Bleeding  diathesis Increased bleeding tendency during surgeries suggests mild bleeding diathesis. Bleeding workup necessary to assess platelet function and clotting ability. Differential includes von Willebrand's disease. Mild diathesis suspected as severe outcomes not observed post-surgery. - Ordered platelet function assay, prothrombin time, partial thromboplastin time, and von Willebrand's panel. - Sent to lab for bleeding workup today.     Orders Placed This Encounter  Procedures   DG Bone Density    Standing Status:   Future    Expected Date:   04/26/2024    Expiration Date:   03/26/2025    Reason for Exam (SYMPTOM  OR DIAGNOSIS REQUIRED):   encounter for screening for osteoporosis.    Preferred imaging location?:   External   CBC with Differential/Platelet    Standing Status:   Standing    Number of Occurrences:   22    Expiration Date:   03/26/2025   Platelet function assay    Standing Status:   Future    Number of Occurrences:   1    Expiration Date:   03/26/2025   Protime-INR    Standing Status:   Future    Number of Occurrences:   1    Expiration Date:   03/26/2025   APTT    Standing Status:   Future    Number of Occurrences:   1    Expiration Date:   03/26/2025   Von Willebrand panel    Standing Status:   Future    Number of Occurrences:   1    Expiration Date:   03/26/2025     HISTORY OF PRESENTING ILLNESS:  Morgan Frye 73 y.o. female is here because of new diagnosis of left sided multifocal breast cancer.  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 No evidence of metastatic disease in the abdomen or pelvis. No obvious endometrial or cervical mass demonstrated. 2. Morphologic changes of hepatic  cirrhosis with stable left hepatic hemangioma. 3. Cholelithiasis without evidence of cholecystitis. 4. Nonobstructing bilateral renal calculi. 5.  Aortic Atherosclerosis (ICD10-I70.   10/03/2022 Surgery   Robotic-assisted laparoscopic total hysterectomy with bilateral salpingoophorectomy, SLN biopsy bilaterally    Findings:  On EUA, 8 cm mobile uterus. Small cervix flush with the vaginal apex. On intra-abdominal entry, normal upper abdominal survey. Normal small and large bowel. Multiple fecaliths within the pelvis (removed). Uterus 8cm and normal in appearance although posterior serosa tore very easily with any sort of manipulation concerning for possible deeper myometrial invasion. Bilateral ovaries and tubes normal in appearance. SLN mapping successful to two pelvic lymph nodes bilaterally. Some fibrosis of the retroperitoneum, more on the left than right. Dense adhesions between the bladder and LUS/cervix.     10/03/2022 Pathology Results   A. LYMPH NODE, SENTINEL, RIGHT OBTURATOR, BIOPSY: - Negative for carcinoma (0/1)  B. LYMPH NODE, SENTINEL, RIGHT EXTERNAL ILIAC, BIOPSY: - Negative for carcinoma (0/1)  C. LYMPH NODE, SENTINEL, LEFT OBTURATOR, BIOPSY: - Negative for carcinoma (0/1)  D. LYMPH NODE, SENTINEL, LEFT OBTURATOR AND ADJACENT NON GREEN LYMPH NODES: - Negative for carcinoma (0/1)  E. UTERUS, CERVIX, BILATERAL FALLOPIAN TUBES AND OVARIES: - Endometrioid carcinoma, FIGO grade 1, pT1a - Myometrial invasion less than 50% - No lymphovascular invasion identified - Cervix: Benign, nabothian cyst - Bilateral fallopian tubes: Benign, paratubal cysts - Bilateral ovaries: No significant pathologic changes - See oncology table  ONCOLOGY TABLE:   UTERUS, CARCINOMA OR CARCINOSARCOMA: Resection  Procedure: Total hysterectomy and bilateral salpingo-oophorectomy Histologic Type: Endometrioid adenocarcinoma Histologic Grade: FIGO grade 1 Myometrial Invasion:      Depth of  Myometrial Invasion (mm): 3 mm      Myometrial Thickness (mm): 15 mm      Percentage of Myometrial Invasion: 20% Uterine Serosa Involvement: Not identified Cervical stromal Involvement: Not identified Extent of involvement of other tissue/organs: Not applicable Peritoneal/Ascitic Fluid: Not submitted Lymphovascular Invasion: Not identified Regional Lymph Nodes:      Pelvic Lymph Nodes Examined: 4 Sentinel                               0 non-sentinel                                  4: total      Pelvic Lymph Nodes with Metastasis: 0 Distant Metastasis:      Distant Site(s) Involved: Not applicable Pathologic Stage Classification (pTNM, AJCC 8th Edition): pT1a, pN0 Ancillary Studies: MMR / MSI testing will be ordered Representative Tumor Block: E8 Comment(s): Appropriately controlled p53 immunohistochemical stain shows wild-type staining - Pancytokeratin stain was performed on the lymph nodes and is negative.     Genetic Testing   Negative genetic testing. No pathogenic variants identified on the [facility] Common Hereditary Cancers+RNA panel. The report date is 11/23/2022.  The Common Hereditary Cancers Panel + RNA offered by [facility] includes sequencing and/or deletion duplication testing of the following 48 genes: APC*, ATM*, AXIN2, BAP1, BARD1, BMPR1A, BRCA1,  BRCA2, BRIP1, CDH1, CDK4, CDKN2A (p14ARF), CDKN2A (p16INK4a), CHEK2, CTNNA1, DICER1*, EPCAM*, FH*, GREM1*, HOXB13, KIT, MBD4, MEN1*, MLH1*, MSH2*, MSH3*, MSH6*, MUTYH, NF1*, NTHL1, PALB2, PDGFRA, PMS2*, POLD1*, POLE, PTEN*, RAD51C, RAD51D, SDHA*, SDHB, SDHC*, SDHD, SMAD4, SMARCA4, STK11, TP53, TSC1*, TSC2, VHL.    Malignant 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, estrogen receptor positive (HCC)  12/11/2023 Mammogram   She had a mammogram in February 2025 and this showed a 1.1 cm irregular mass in the left breast at 3:00 posterior depth, 1 cm irregular mass in the left breast at 3:00 middle to posterior depth, the 2  masses span 3.5 cm anteroposteriorly.   12/18/2023 Pathology Results   She has had 3 biopsies of the left breast, at 3:00, 8 cm from the nipple, 9 cm from the centimeters from the nipple.  Prognostics from the 10 cm from the nipple showed ER 99% positive strong staining, PR 70% weak to strong staining, Ki-67 20% HER2 1+.  Prognostics from the 3:00 mass at 9 cm from nipple once again showed ER 100% positive strong staining PR 90% positive to strong staining HER2 1+ prognostics from the left breast needle biopsy at 3:00 8 cm from the nipple also showed ER 100% positive strong staining PR 75% positive weak to strong staining.   12/23/2023 Initial Diagnosis   Malignant 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, estrogen receptor positive (HCC)   12/25/2023 Cancer Staging   Staging form: Breast, AJCC 8th Edition - Clinical stage from 12/25/2023: Stage IA (cT1c, cN0, cM0, G2, ER+, PR+, HER2-) - Signed by Murleen Arms, MD on 12/25/2023 Stage prefix: Initial diagnosis Histologic grading system: 3 grade system   Discussed the use of AI scribe software for clinical note transcription with the patient, who gave verbal consent to proceed.  History of Present Illness  Morgan Frye is a 73 year old female with invasive lobular breast cancer who presents for follow-up after radiation therapy.  She completed radiation therapy approximately ten days ago. Since then, she has experienced occasional yellow fluid drainage from a small opening under her breast, which is now almost closed. She wears a compression camisole and observes the fluid infrequently.  She has a history of bleeding diathesis, which has been noted during previous surgeries, including knee replacement and hysterectomy, where she experienced excessive bleeding. Her anesthetist expressed concerns about her bleeding tendency during her last knee replacement.  She has had two bone density screenings in the past and plans to continue  with regular screenings every two years.  [place] after her appointment. She has a house between [place] and [place], which she visits frequently. Her son lives in [place], and she has two grand dogs. Her social history includes a supportive family network, with her son being a Land and her daughter-in-law a Garment/textile technologist.   MEDICAL HISTORY:  Past Medical History:  Diagnosis Date   Arthritis    osteoarthritis- knees, hips, rt. hip bursitis   Blood dyscrasia    bruises easily and bleeds easy after surgery   Breast cancer (HCC) 11/2023   left breast ILV   Diabetes mellitus type 2 in obese    Hemangioma of liver    very small- evaluated and considered stable- no further problems or follow up in many years   History of kidney stones    x1 lithotripsy-has others not a bother at this time   History of palpitations  during menopause, on metoprolol . Saw cardiologist   Hypertension    Obesity (BMI 30-39.9)    Transfusion history    '1981- s/p childbirth    SURGICAL HISTORY: Past Surgical History:  Procedure Laterality Date   ABDOMINAL HYSTERECTOMY     BREAST LUMPECTOMY WITH RADIOACTIVE SEED AND SENTINEL LYMPH NODE BIOPSY Left 01/02/2024   Procedure: LEFT BREAST SEED BRACKETED LUMPECTOMY, LEFT SENTINEL LYMPH NODE MAPPING;  Surgeon: Sim Dryer, MD;  Location: [facility];  Service: General;  Laterality: Left;  PEC BLOCK   CATARACT EXTRACTION, BILATERAL Bilateral    5 yrs ago   DILATION AND CURETTAGE OF UTERUS  2010   perimenopause, caused bleeding, had polyp removed   LITHOTRIPSY     palpitations     had durning menopause, saw cards and given metoprolol    ROBOTIC ASSISTED TOTAL HYSTERECTOMY WITH BILATERAL SALPINGO OOPHERECTOMY N/A 10/03/2022   Procedure: XI ROBOTIC ASSISTED TOTAL HYSTERECTOMY WITH BILATERAL SALPINGO OOPHORECTOMY;  Surgeon: Suzi Essex, MD;  Location: [facility];  Service: Gynecology;   Laterality: N/A;   SENTINEL NODE BIOPSY N/A 10/03/2022   Procedure: SENTINEL NODE BIOPSY;  Surgeon: Suzi Essex, MD;  Location: [facility];  Service: Gynecology;  Laterality: N/A;   TONSILLECTOMY     child   TOTAL KNEE ARTHROPLASTY Right 05/24/2014   Procedure: RIGHT TOTAL KNEE ARTHROPLASTY;  Surgeon: Aurther Blue, MD;  Location: [facility];  Service: Orthopedics;  Laterality: Right;   TOTAL KNEE ARTHROPLASTY Left 05/30/2015   Procedure: LEFT TOTAL KNEE ARTHROPLASTY;  Surgeon: Liliane Rei, MD;  Location: [facility];  Service: Orthopedics;  Laterality: Left;    SOCIAL HISTORY: Social History   Socioeconomic History   Marital status: Married    Spouse name: Not on file   Number of children: 2   Years of education: Not on file   Highest education level: Not on file  Occupational History   Occupation: retired  Tobacco Use   Smoking status: Never   Smokeless tobacco: Never  Vaping Use   Vaping status: Never Used  Substance and Sexual Activity   Alcohol use: Never   Drug use: No   Sexual activity: Yes  Other Topics Concern   Not on file  Social History Narrative   Lives with husband.     Social Drivers of Corporate investment banker Strain: Low Risk  (09/13/2022)   Overall Financial Resource Strain (CARDIA)    Difficulty of Paying Living Expenses: Not hard at all  Food Insecurity: No Food Insecurity (01/30/2024)   Hunger Vital Sign    Worried About Running Out of Food in the Last Year: Never true    Ran Out of Food in the Last Year: Never true  Transportation Needs: No Transportation Needs (01/30/2024)   PRAPARE - Administrator, Civil Service (Medical): No    Lack of Transportation (Non-Medical): No  Physical Activity: Not on file  Stress: Not on file  Social Connections: Not on file  Intimate Partner Violence: Not At Risk (01/30/2024)   Humiliation, Afraid, Rape, and Kick questionnaire    Fear of Current or Ex-Partner: No    Emotionally Abused: No     Physically Abused: No    Sexually Abused: No    FAMILY HISTORY: Family History  Problem Relation Age of Onset   Liver disease Mother    Heart disease Father        ICD.  Father is 50   Prostate cancer Father 21   Colon  cancer Neg Hx    Breast cancer Neg Hx    Ovarian cancer Neg Hx    Endometrial cancer Neg Hx    Pancreatic cancer Neg Hx     ALLERGIES:  is allergic to penicillins and tylenol  [acetaminophen ].  MEDICATIONS:  Current Outpatient Medications  Medication Sig Dispense Refill   anastrozole  (ARIMIDEX ) 1 MG tablet Take 1 tablet (1 mg total) by mouth daily. 90 tablet 3   Accu-Chek FastClix Lancets MISC Apply topically daily.     ACCU-CHEK GUIDE test strip daily.     atorvastatin  (LIPITOR) 20 MG tablet Take 20 mg by mouth at bedtime.     Calcium  Carb-Cholecalciferol (CALCIUM  + VITAMIN D3 PO) Take 1 tablet by mouth every evening.     cetirizine (ZYRTEC) 10 MG tablet Take 10 mg by mouth daily.     Cholecalciferol (VITAMIN D3) 50 MCG (2000 UT) CAPS Take 2,000 Units by mouth daily.     ciclopirox (LOPROX) 0.77 % cream Apply 1 application  topically 2 (two) times daily as needed (yeast under breasts).     lisinopril-hydrochlorothiazide (PRINZIDE,ZESTORETIC) 20-12.5 MG per tablet Take 1 tablet by mouth every morning.     metFORMIN (GLUCOPHAGE-XR) 500 MG 24 hr tablet Take 500 mg by mouth every evening.     metoprolol  succinate (TOPROL -XL) 50 MG 24 hr tablet Take 50 mg by mouth every evening. Take with or immediately following a meal.     Multiple Vitamin (MULTIVITAMIN) tablet Take 1 tablet by mouth daily.     No current facility-administered medications for this visit.     PHYSICAL EXAMINATION: ECOG PERFORMANCE STATUS: 0 - Asymptomatic  Vitals:   03/26/24 0947  BP: 134/65  Pulse: 84  Resp: 18  Temp: (!) 97.4 F (36.3 C)  SpO2: 94%    Filed Weights   03/26/24 0947  Weight: 227 lb 3.2 oz (103.1 kg)     GENERAL:alert, no distress and comfortable Left breast with  post rad changes. Dehiscence at the edge of the scar is nearly healed.  LABORATORY DATA:  I have reviewed the data as listed Lab Results  Component Value Date   WBC 4.2 03/26/2024   HGB 15.4 (H) 03/26/2024   HCT 42.6 03/26/2024   MCV 89.3 03/26/2024   PLT 125 (L) 03/26/2024     Chemistry      Component Value Date/Time   NA 139 12/25/2023 1206   K 3.7 12/25/2023 1206   CL 105 12/25/2023 1206   CO2 27 12/25/2023 1206   BUN 16 12/25/2023 1206   CREATININE 0.71 12/25/2023 1206      Component Value Date/Time   CALCIUM  9.7 12/25/2023 1206   ALKPHOS 73 12/25/2023 1206   AST 23 12/25/2023 1206   ALT 21 12/25/2023 1206   BILITOT 1.0 12/25/2023 1206       RADIOGRAPHIC STUDIES: I have personally reviewed the radiological images as listed and agreed with the findings in the report. No results found.  All questions were answered. The patient knows to call the clinic with any problems, questions or concerns. I spent 30 minutes in the care of this patient including H and P, review of records, counseling and coordination of care.     Murleen Arms, MD 03/26/2024 12:43 PM

## 2024-03-26 NOTE — Assessment & Plan Note (Addendum)
 Multifocal lobular breast cancer She has multifocal lobular breast cancer with three areas, the largest being 2.1 cm. All margins are negative, and the sentinel lymph node is clear. The oncotype score is 13, indicating that chemotherapy is not necessary. The cancer is estrogen-receptor positive, and the plan is to proceed with radiation therapy followed by antiestrogen therapy. Radiation therapy is planned for 4-6 weeks, 5 days a week, with each session lasting few minutes. Anastrozole therapy is recommended for a minimum of 7 years due to lobular histology, as lobular cancers rely heavily on estrogen for growth.  - She is now s/p adjuvant radiation, completed 03/13/2024.  Assessment and Plan Assessment & Plan Invasive lobular breast cancer Post-radiation therapy , tolerated it well. Anastrozole recommended to reduce estrogen levels and prevent recurrence. Bone density monitoring essential due to potential negative effects on bone health. - Prescribed anastrozole and sent prescription to pharmacy on [facility]. - Ordered bone density screening at [facility] within the next couple of months. - Encouraged weight-bearing exercises, vitamin D, and calcium  supplements for bone health. - Scheduled follow-up visit in three months for survivorship visit to assess long-term effects and tolerance to anastrozole.  Bleeding diathesis Increased bleeding tendency during surgeries suggests mild bleeding diathesis. Bleeding workup necessary to assess platelet function and clotting ability. Differential includes von Willebrand's disease. Mild diathesis suspected as severe outcomes not observed post-surgery. - Ordered platelet function assay, prothrombin time, partial thromboplastin time, and von Willebrand's panel. - Sent to lab for bleeding workup today.

## 2024-03-27 ENCOUNTER — Telehealth: Payer: Self-pay

## 2024-03-27 LAB — VON WILLEBRAND PANEL
Coagulation Factor VIII: 127 % (ref 56–140)
Ristocetin Co-factor, Plasma: 177 % (ref 50–200)
Von Willebrand Antigen, Plasma: 202 % — ABNORMAL HIGH (ref 50–200)

## 2024-03-27 LAB — COAG STUDIES INTERP REPORT

## 2024-03-27 NOTE — Telephone Encounter (Signed)
 Orders faxed to [facility] per MD. Fax confirmation received.

## 2024-03-27 NOTE — Telephone Encounter (Signed)
-----   Message from [place] sent at 03/26/2024  9:54 AM EDT ----- Morgan Frye  Can we fax the bone density request to [facility]

## 2024-04-03 NOTE — Progress Notes (Addendum)
  Morgan Frye presents today for follow-up after completing radiation to her left breast on 03/13/2024. Patient is doing well post radiation,  Pain: Patient denies Skin: Skin is coming back to normal. Incision is tender, a small amount  ROM: None Lymphedema: Yes, wears compression sleeve, causes some minimal discomfort. MedOnc F/U: 06/30/2024 Other issues of note:  None  Pt reports Yes No Comments  Tamoxifen []  []    Letrozole []  []    Anastrazole [x]  []  Tolerating medication well with no side effects  Mammogram []  Date:  []        Encouraged patient to maintain upcoming appointments and to call our office with any questions or concerns.

## 2024-04-06 ENCOUNTER — Ambulatory Visit: Payer: Self-pay | Attending: Surgery

## 2024-04-06 VITALS — Wt 226.1 lb

## 2024-04-06 DIAGNOSIS — Z483 Aftercare following surgery for neoplasm: Secondary | ICD-10-CM | POA: Insufficient documentation

## 2024-04-06 NOTE — Therapy (Signed)
 OUTPATIENT PHYSICAL THERAPY SOZO SCREENING NOTE   Patient Name: Morgan Frye MRN: [id number] DOB:December 21, 1950, 73 y.o., female Today's Date: 04/06/2024  PCP: Lanae Pinal, MD REFERRING PROVIDER: Sim Dryer, MD   PT End of Session - 04/06/24 0850     Visit Number 2   # unchanged due to screen only   PT Start Time 0848    PT Stop Time 0859    PT Time Calculation (min) 11 min    Activity Tolerance Patient tolerated treatment well    Behavior During Therapy [facility] for tasks assessed/performed             Past Medical History:  Diagnosis Date   Arthritis    osteoarthritis- knees, hips, rt. hip bursitis   Blood dyscrasia    bruises easily and bleeds easy after surgery   Breast cancer (HCC) 11/2023   left breast ILV   Diabetes mellitus type 2 in obese    Hemangioma of liver    very small- evaluated and considered stable- no further problems or follow up in many years   History of kidney stones    x1 lithotripsy-has others not a bother at this time   History of palpitations    during menopause, on metoprolol . Saw cardiologist   Hypertension    Obesity (BMI 30-39.9)    Transfusion history    '1981- s/p childbirth   Past Surgical History:  Procedure Laterality Date   ABDOMINAL HYSTERECTOMY     BREAST LUMPECTOMY WITH RADIOACTIVE SEED AND SENTINEL LYMPH NODE BIOPSY Left 01/02/2024   Procedure: LEFT BREAST SEED BRACKETED LUMPECTOMY, LEFT SENTINEL LYMPH NODE MAPPING;  Surgeon: Sim Dryer, MD;  Location: [facility];  Service: General;  Laterality: Left;  PEC BLOCK   CATARACT EXTRACTION, BILATERAL Bilateral    5 yrs ago   DILATION AND CURETTAGE OF UTERUS  2010   perimenopause, caused bleeding, had polyp removed   LITHOTRIPSY     palpitations     had durning menopause, saw cards and given metoprolol    ROBOTIC ASSISTED TOTAL HYSTERECTOMY WITH BILATERAL SALPINGO OOPHERECTOMY N/A 10/03/2022   Procedure: XI ROBOTIC ASSISTED TOTAL HYSTERECTOMY WITH BILATERAL  SALPINGO OOPHORECTOMY;  Surgeon: Suzi Essex, MD;  Location: [facility];  Service: Gynecology;  Laterality: N/A;   SENTINEL NODE BIOPSY N/A 10/03/2022   Procedure: SENTINEL NODE BIOPSY;  Surgeon: Suzi Essex, MD;  Location: [facility];  Service: Gynecology;  Laterality: N/A;   TONSILLECTOMY     child   TOTAL KNEE ARTHROPLASTY Right 05/24/2014   Procedure: RIGHT TOTAL KNEE ARTHROPLASTY;  Surgeon: Aurther Blue, MD;  Location: [facility];  Service: Orthopedics;  Laterality: Right;   TOTAL KNEE ARTHROPLASTY Left 05/30/2015   Procedure: LEFT TOTAL KNEE ARTHROPLASTY;  Surgeon: Liliane Rei, MD;  Location: [facility];  Service: Orthopedics;  Laterality: Left;   Patient Active Problem List   Diagnosis Date Noted   Carcinoma of upper-outer quadrant of female breast, left (HCC) 12/25/2023   Malignant 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, estrogen receptor positive (HCC) 12/23/2023   Genetic testing 11/27/2022   Endometrial cancer (HCC) 09/07/2022   Type 2 diabetes mellitus with obesity (HCC) 09/07/2022   Obesity (BMI 30-39.9) 09/07/2022   Palpitation 03/25/2015   Hyperlipidemia 05/28/2014   Essential hypertension, benign 05/28/2014   Constipation 05/28/2014   Hyponatremia 05/26/2014   Postoperative anemia due to acute blood loss 05/26/2014   OA (osteoarthritis) of knee 05/24/2014    REFERRING DIAG: left breast cancer at risk  for lymphedema  THERAPY DIAG: Aftercare following surgery for neoplasm  PERTINENT HISTORY: Lt lumpectomy on 01/02/24 with 1 negative node removed. 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 It measures 3.5 cm and is located in the upper outer quadrant. It is ER/PR positive and HER2 negative with a Ki67 of 20%. She has a history of endometrial cancer 09/04/2022 treated surgically with a hysterectomy and 4 lymph nodes removed. She has a mild risk of bilateral lower extremity lymphedema.  PRECAUTIONS: left  UE Lymphedema risk, None  SUBJECTIVE: Pt returns for her first 3 month L-Dex screen. "I just finished radiation last week."  PAIN:  Are you having pain? No  SOZO SCREENING: Patient was assessed today using the SOZO machine to determine the lymphedema index score. This was compared to her baseline score. It was determined that she is NOT within the recommended range when compared to her baseline and so she was fitted for a compression garment while in the clinic today. It is recommended she return in 1 month to be reassessed. If she continues to measure outside the recommended range, physical therapy treatment will be recommended at that time and a referral requested.   L-DEX FLOWSHEETS - 04/06/24 0800       L-DEX LYMPHEDEMA SCREENING   Measurement Type Unilateral    L-DEX MEASUREMENT EXTREMITY Upper Extremity    POSITION  Standing    DOMINANT SIDE Left    At Risk Side Left    BASELINE SCORE (UNILATERAL) -3.5    L-DEX SCORE (UNILATERAL) 6.1    VALUE CHANGE (UNILAT) 9.6            P: Pt to return in 1 month for reassess after subclinical lymphedema detected.   Denyce Flank, PTA 04/06/2024, 10:17 AM     PLEASE KEEP YOUR COMPRESSION GARMENT ON DURING THE DAY TO GET THE BEST SWELLING REDUCTION. HERE ARE SOME ADDITIONAL TIPS: Do not sleep in your garment. If you have pain or notice swelling in your hand or at the top of your shoulder, call your therapist. This may be a sign that you need a different garment. 3.  Take good care of your garment so it lasts longer: Follow washing instructions on your garment label or box. Wash periodically using a mild detergent in warm water .  Do not use fabric softener or bleach.  Place garment in a mesh lingerie bag and use the gentle cycle of the washing machine or hand wash. Tumble dry low or lay flat to dry. TAKE CARE OF YOUR SKIN Apply a low pH moisturizing lotion to your skin daily Avoid scratching your skin Treat skin irritations  quickly  Know the 5 warning signs of infection: redness, pain, warmth to touch, fever and increased swelling.  Call your physician immediately if you notice any of these signs of a possible infection.   [facility] [phone]

## 2024-04-13 DIAGNOSIS — M8588 Other specified disorders of bone density and structure, other site: Secondary | ICD-10-CM | POA: Diagnosis not present

## 2024-04-16 ENCOUNTER — Ambulatory Visit
Admission: RE | Admit: 2024-04-16 | Discharge: 2024-04-16 | Disposition: A | Discharge status: Home / Self Care | Source: Ambulatory Visit | Attending: Radiation Oncology | Admitting: Radiation Oncology

## 2024-04-21 DIAGNOSIS — M8580 Other specified disorders of bone density and structure, unspecified site: Secondary | ICD-10-CM | POA: Diagnosis not present

## 2024-05-06 ENCOUNTER — Ambulatory Visit: Attending: Surgery | Admitting: Rehabilitation

## 2024-05-06 ENCOUNTER — Encounter: Payer: Self-pay | Admitting: Rehabilitation

## 2024-05-06 DIAGNOSIS — Z9189 Other specified personal risk factors, not elsewhere classified: Secondary | ICD-10-CM | POA: Insufficient documentation

## 2024-05-06 DIAGNOSIS — C50412 Malignant neoplasm of upper-outer quadrant of left female breast: Secondary | ICD-10-CM | POA: Insufficient documentation

## 2024-05-06 DIAGNOSIS — Z171 Estrogen receptor negative status [ER-]: Secondary | ICD-10-CM | POA: Insufficient documentation

## 2024-05-06 DIAGNOSIS — Z483 Aftercare following surgery for neoplasm: Secondary | ICD-10-CM | POA: Insufficient documentation

## 2024-05-06 NOTE — Therapy (Signed)
 OUTPATIENT PHYSICAL THERAPY SOZO SCREENING NOTE   Patient Name: Morgan Frye MRN: [id number] DOB:Nov 05, 1950, 73 y.o., female Today's Date: 05/06/2024  PCP: Regino Slater, MD REFERRING PROVIDER: Vanderbilt Ned, MD   PT End of Session - 05/06/24 0858     Visit Number 2   screen   PT Start Time 0850    PT Stop Time 0858    PT Time Calculation (min) 8 min    Activity Tolerance Patient tolerated treatment well    Behavior During Therapy [facility] for tasks assessed/performed          Past Medical History:  Diagnosis Date   Arthritis    osteoarthritis- knees, hips, rt. hip bursitis   Blood dyscrasia    bruises easily and bleeds easy after surgery   Breast cancer (HCC) 11/2023   left breast ILV   Diabetes mellitus type 2 in obese    Hemangioma of liver    very small- evaluated and considered stable- no further problems or follow up in many years   History of kidney stones    x1 lithotripsy-has others not a bother at this time   History of palpitations    during menopause, on metoprolol . Saw cardiologist   Hypertension    Obesity (BMI 30-39.9)    Transfusion history    '1981- s/p childbirth   Past Surgical History:  Procedure Laterality Date   ABDOMINAL HYSTERECTOMY     BREAST LUMPECTOMY WITH RADIOACTIVE SEED AND SENTINEL LYMPH NODE BIOPSY Left 01/02/2024   Procedure: LEFT BREAST SEED BRACKETED LUMPECTOMY, LEFT SENTINEL LYMPH NODE MAPPING;  Surgeon: Vanderbilt Ned, MD;  Location: [facility];  Service: General;  Laterality: Left;  PEC BLOCK   CATARACT EXTRACTION, BILATERAL Bilateral    5 yrs ago   DILATION AND CURETTAGE OF UTERUS  2010   perimenopause, caused bleeding, had polyp removed   LITHOTRIPSY     palpitations     had durning menopause, saw cards and given metoprolol    ROBOTIC ASSISTED TOTAL HYSTERECTOMY WITH BILATERAL SALPINGO OOPHERECTOMY N/A 10/03/2022   Procedure: XI ROBOTIC ASSISTED TOTAL HYSTERECTOMY WITH BILATERAL SALPINGO OOPHORECTOMY;   Surgeon: Viktoria Comer SAUNDERS, MD;  Location: [facility];  Service: Gynecology;  Laterality: N/A;   SENTINEL NODE BIOPSY N/A 10/03/2022   Procedure: SENTINEL NODE BIOPSY;  Surgeon: Viktoria Comer SAUNDERS, MD;  Location: [facility];  Service: Gynecology;  Laterality: N/A;   TONSILLECTOMY     child   TOTAL KNEE ARTHROPLASTY Right 05/24/2014   Procedure: RIGHT TOTAL KNEE ARTHROPLASTY;  Surgeon: Dempsey Melodi GAILS, MD;  Location: [facility];  Service: Orthopedics;  Laterality: Right;   TOTAL KNEE ARTHROPLASTY Left 05/30/2015   Procedure: LEFT TOTAL KNEE ARTHROPLASTY;  Surgeon: Dempsey Melodi, MD;  Location: [facility];  Service: Orthopedics;  Laterality: Left;   Patient Active Problem List   Diagnosis Date Noted   Carcinoma of upper-outer quadrant of female breast, left (HCC) 12/25/2023   Malignant 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, estrogen receptor positive (HCC) 12/23/2023   Genetic testing 11/27/2022   Endometrial cancer (HCC) 09/07/2022   Type 2 diabetes mellitus with obesity (HCC) 09/07/2022   Obesity (BMI 30-39.9) 09/07/2022   Palpitation 03/25/2015   Hyperlipidemia 05/28/2014   Essential hypertension, benign 05/28/2014   Constipation 05/28/2014   Hyponatremia 05/26/2014   Postoperative anemia due to acute blood loss 05/26/2014   OA (osteoarthritis) of knee 05/24/2014    REFERRING DIAG: left breast cancer at risk for lymphedema  THERAPY DIAG: Aftercare following surgery  for neoplasm  Malignant neoplasm of upper-outer quadrant of left breast in female, estrogen receptor negative (HCC)  At risk for lymphedema  PERTINENT HISTORY: Lt lumpectomy on 01/02/24 with 1 negative node removed. 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 It measures 3.5 cm and is located in the upper outer quadrant. It is ER/PR positive and HER2 negative with a Ki67 of 20%. She has a history of endometrial cancer 09/04/2022 treated surgically with a hysterectomy  and 4 lymph nodes removed. She has a mild risk of bilateral lower extremity lymphedema.  PRECAUTIONS: left UE Lymphedema risk, None  SUBJECTIVE: Pt returns for her first 3 month L-Dex screen. I just finished radiation last week.  PAIN:  Are you having pain? No  SOZO SCREENING: Patient was assessed today using the SOZO machine to determine the lymphedema index score. This was compared to her baseline score. It was determined that she is back within the recommended range when compared to her baseline. She will now wear her sleeve with high levels of activity and exercise.    L-DEX FLOWSHEETS - 05/06/24 0800       L-DEX LYMPHEDEMA SCREENING   Measurement Type Unilateral    L-DEX MEASUREMENT EXTREMITY Upper Extremity    POSITION  Standing    DOMINANT SIDE Left    At Risk Side Left    BASELINE SCORE (UNILATERAL) -3.5    L-DEX SCORE (UNILATERAL) 2.2    VALUE CHANGE (UNILAT) 5.7         P: Pt to return in 1 month for reassess after subclinical lymphedema detected.   Larue Saddie SAUNDERS, PT 05/06/2024, 8:58 AM     PLEASE KEEP YOUR COMPRESSION GARMENT ON DURING THE DAY TO GET THE BEST SWELLING REDUCTION. HERE ARE SOME ADDITIONAL TIPS: Do not sleep in your garment. If you have pain or notice swelling in your hand or at the top of your shoulder, call your therapist. This may be a sign that you need a different garment. 3.  Take good care of your garment so it lasts longer: Follow washing instructions on your garment label or box. Wash periodically using a mild detergent in warm water .  Do not use fabric softener or bleach.  Place garment in a mesh lingerie bag and use the gentle cycle of the washing machine or hand wash. Tumble dry low or lay flat to dry. TAKE CARE OF YOUR SKIN Apply a low pH moisturizing lotion to your skin daily Avoid scratching your skin Treat skin irritations quickly  Know the 5 warning signs of infection: redness, pain, warmth to touch, fever and increased swelling.   Call your physician immediately if you notice any of these signs of a possible infection.   [facility] [phone]

## 2024-05-25 ENCOUNTER — Ambulatory Visit

## 2024-05-28 DIAGNOSIS — Z170 Estrogen receptor positive status [ER+]: Secondary | ICD-10-CM | POA: Diagnosis not present

## 2024-05-28 DIAGNOSIS — C50812 Malignant neoplasm of overlapping sites of left female breast: Secondary | ICD-10-CM | POA: Diagnosis not present

## 2024-06-11 DIAGNOSIS — R921 Mammographic calcification found on diagnostic imaging of breast: Secondary | ICD-10-CM | POA: Diagnosis not present

## 2024-06-11 DIAGNOSIS — Z853 Personal history of malignant neoplasm of breast: Secondary | ICD-10-CM | POA: Diagnosis not present

## 2024-06-11 DIAGNOSIS — N6489 Other specified disorders of breast: Secondary | ICD-10-CM | POA: Diagnosis not present

## 2024-06-15 DIAGNOSIS — Z79899 Other long term (current) drug therapy: Secondary | ICD-10-CM | POA: Diagnosis not present

## 2024-06-15 DIAGNOSIS — E1165 Type 2 diabetes mellitus with hyperglycemia: Secondary | ICD-10-CM | POA: Diagnosis not present

## 2024-06-15 DIAGNOSIS — K7460 Unspecified cirrhosis of liver: Secondary | ICD-10-CM | POA: Diagnosis not present

## 2024-06-15 DIAGNOSIS — I10 Essential (primary) hypertension: Secondary | ICD-10-CM | POA: Diagnosis not present

## 2024-06-15 DIAGNOSIS — E7800 Pure hypercholesterolemia, unspecified: Secondary | ICD-10-CM | POA: Diagnosis not present

## 2024-06-22 DIAGNOSIS — C50812 Malignant neoplasm of overlapping sites of left female breast: Secondary | ICD-10-CM | POA: Diagnosis not present

## 2024-06-22 DIAGNOSIS — Z170 Estrogen receptor positive status [ER+]: Secondary | ICD-10-CM | POA: Diagnosis not present

## 2024-06-22 DIAGNOSIS — Z4889 Encounter for other specified surgical aftercare: Secondary | ICD-10-CM | POA: Diagnosis not present

## 2024-06-30 ENCOUNTER — Inpatient Hospital Stay: Attending: Adult Health | Admitting: Adult Health

## 2024-06-30 ENCOUNTER — Encounter: Payer: Self-pay | Admitting: Adult Health

## 2024-06-30 VITALS — BP 145/72 | HR 92 | Temp 97.0°F | Resp 16 | Wt 227.8 lb

## 2024-06-30 DIAGNOSIS — Z8542 Personal history of malignant neoplasm of other parts of uterus: Secondary | ICD-10-CM | POA: Diagnosis not present

## 2024-06-30 DIAGNOSIS — Z1721 Progesterone receptor positive status: Secondary | ICD-10-CM | POA: Diagnosis not present

## 2024-06-30 DIAGNOSIS — Z1732 Human epidermal growth factor receptor 2 negative status: Secondary | ICD-10-CM | POA: Insufficient documentation

## 2024-06-30 DIAGNOSIS — Z923 Personal history of irradiation: Secondary | ICD-10-CM | POA: Insufficient documentation

## 2024-06-30 DIAGNOSIS — Z90722 Acquired absence of ovaries, bilateral: Secondary | ICD-10-CM | POA: Diagnosis not present

## 2024-06-30 DIAGNOSIS — Z170 Estrogen receptor positive status [ER+]: Secondary | ICD-10-CM | POA: Insufficient documentation

## 2024-06-30 DIAGNOSIS — C50812 Malignant neoplasm of overlapping sites of left female breast: Secondary | ICD-10-CM | POA: Diagnosis not present

## 2024-06-30 DIAGNOSIS — Z90710 Acquired absence of both cervix and uterus: Secondary | ICD-10-CM | POA: Insufficient documentation

## 2024-06-30 DIAGNOSIS — Z8042 Family history of malignant neoplasm of prostate: Secondary | ICD-10-CM | POA: Insufficient documentation

## 2024-06-30 DIAGNOSIS — Z79811 Long term (current) use of aromatase inhibitors: Secondary | ICD-10-CM | POA: Diagnosis not present

## 2024-06-30 NOTE — Progress Notes (Signed)
 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 No evidence of metastatic disease in the abdomen or pelvis. No obvious endometrial or cervical mass demonstrated. 2. 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 3. Cholelithiasis without evidence of cholecystitis. 4. Nonobstructing bilateral renal calculi. 5.  Aortic Atherosclerosis (ICD10-I70.   10/03/2022 Surgery   Robotic-assisted laparoscopic total hysterectomy with bilateral salpingoophorectomy, SLN biopsy bilaterally    Findings:  On EUA, 8 cm mobile uterus. Small cervix flush with the vaginal apex. On intra-abdominal entry, normal upper abdominal survey. Normal small and large bowel. Multiple fecaliths within the pelvis (removed). Uterus 8cm and normal in appearance although posterior serosa tore very easily with any sort of manipulation concerning for possible deeper myometrial invasion. Bilateral ovaries and tubes normal in appearance. SLN mapping successful to two pelvic lymph nodes bilaterally. Some fibrosis of the retroperitoneum, more on the left than right. Dense adhesions between the bladder and LUS/cervix.     10/03/2022 Pathology Results   A. LYMPH NODE, SENTINEL, RIGHT OBTURATOR, BIOPSY: - Negative for carcinoma (0/1)  B. LYMPH NODE, SENTINEL, RIGHT EXTERNAL ILIAC, BIOPSY: - Negative for carcinoma (0/1)  C. LYMPH NODE, SENTINEL, LEFT OBTURATOR, BIOPSY: - Negative for carcinoma (0/1)  D. LYMPH NODE, SENTINEL, LEFT OBTURATOR AND ADJACENT NON GREEN LYMPH NODES: - Negative for carcinoma (0/1)  E. UTERUS, CERVIX, BILATERAL FALLOPIAN TUBES AND OVARIES: - Endometrioid carcinoma,  FIGO grade 1, pT1a - Myometrial invasion less than 50% - No lymphovascular invasion identified - Cervix: Benign, nabothian cyst - Bilateral fallopian tubes: Benign, paratubal cysts - Bilateral ovaries: No significant pathologic changes - See oncology table  ONCOLOGY TABLE:   UTERUS, CARCINOMA OR CARCINOSARCOMA: Resection  Procedure: Total hysterectomy and bilateral salpingo-oophorectomy Histologic Type: Endometrioid adenocarcinoma Histologic Grade: FIGO grade 1 Myometrial Invasion:      Depth of Myometrial Invasion (mm): 3 mm      Myometrial Thickness (mm): 15 mm      Percentage of Myometrial Invasion: 20% Uterine Serosa Involvement: Not identified Cervical stromal Involvement: Not identified Extent of involvement of other tissue/organs: Not applicable Peritoneal/Ascitic Fluid: Not submitted Lymphovascular Invasion: Not identified Regional Lymph Nodes:      Pelvic Lymph Nodes Examined: 4 Sentinel                               0 non-sentinel                                  4: total      Pelvic Lymph Nodes with Metastasis: 0 Distant Metastasis:      Distant Site(s) Involved: Not applicable Pathologic Stage Classification (pTNM, AJCC 8th Edition): pT1a, pN0 Ancillary Studies: MMR / MSI testing will be ordered Representative Tumor Block: E8 Comment(s): Appropriately controlled p53 immunohistochemical stain shows wild-type staining - Pancytokeratin stain was performed on the lymph nodes and is negative.     Genetic Testing   Negative genetic testing. No pathogenic variants identified on the [facility] Common Hereditary Cancers+RNA panel. The report date is 11/23/2022.  The Common Hereditary Cancers Panel + RNA offered by  [facility] includes sequencing and/or deletion duplication testing of the following 48 genes: APC*, ATM*, AXIN2, BAP1, BARD1, BMPR1A, BRCA1, BRCA2, BRIP1, CDH1, CDK4, CDKN2A (p14ARF), CDKN2A (p16INK4a), CHEK2, CTNNA1, DICER1*, EPCAM*, FH*, GREM1*, HOXB13, KIT, MBD4,  MEN1*, MLH1*, MSH2*, MSH3*, MSH6*, MUTYH, NF1*, NTHL1, PALB2, PDGFRA, PMS2*, POLD1*, POLE, PTEN*, RAD51C, RAD51D, SDHA*, SDHB, SDHC*, SDHD, SMAD4, SMARCA4, STK11, TP53, TSC1*, TSC2, VHL.    Malignant 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, estrogen receptor positive (HCC)  12/11/2023 Mammogram   She had a mammogram in February 2025 and this showed a 1.1 cm irregular mass in the left breast at 3:00 posterior depth, 1 cm irregular mass in the left breast at 3:00 middle to posterior depth, the 2 masses span 3.5 cm anteroposteriorly.   12/18/2023 Pathology Results   She has had 3 biopsies of the left breast, at 3:00, 8 cm from the nipple, 9 cm from the centimeters from the nipple.  Prognostics from the 10 cm from the nipple showed ER 99% positive strong staining, PR 70% weak to strong staining, Ki-67 20% HER2 1+.  Prognostics from the 3:00 mass at 9 cm from nipple once again showed ER 100% positive strong staining PR 90% positive to strong staining HER2 1+ prognostics from the left breast needle biopsy at 3:00 8 cm from the nipple also showed ER 100% positive strong staining PR 75% positive weak to strong staining.   12/23/2023 Initial Diagnosis   Malignant 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, estrogen receptor positive (HCC)   12/25/2023 Cancer Staging   Staging form: Breast, AJCC 8th Edition - Clinical stage from 12/25/2023: Stage IA (cT1c, cN0, cM0, G2, ER+, PR+, HER2-) - Signed by Loretha Ash, MD on 12/25/2023 Stage prefix: Initial diagnosis Histologic grading system: 3 grade system   02/17/2024 - 03/13/2024 Radiation Therapy   Plan Name: Breast_L_BH Site: Breast, Left Technique: 3D Mode: Photon Dose Per Fraction: 2.67 Gy Prescribed Dose (Delivered / Prescribed): 40.05 Gy / 40.05 Gy Prescribed Fxs (Delivered / Prescribed): 15 / 15   Plan Name: Brst_L_Bst_BH Site: Breast, Left Technique: 3D Mode: Photon Dose Per Fraction: 2 Gy Prescribed Dose (Delivered /  Prescribed): 10 Gy / 10 Gy Prescribed Fxs (Delivered / Prescribed): 5 / 5   03/2024 -  Anti-estrogen oral therapy   Anastrozole      INTERVAL HISTORY:  Discussed the use of AI scribe software for clinical note transcription with the patient, who gave verbal consent to proceed.  History of Present Illness Morgan Frye is a 73 year old female with stage 1A breast cancer who presents for follow-up regarding post-surgical seroma and ongoing treatment.  She experiences persistent drainage from her surgical incision, which has not fully healed. Monthly mammograms and ultrasounds show no fluid buildup. She underwent a lumpectomy with removal of one lymph node and experiences scars and fluid buildup at the lumpectomy site. She attends the lymphedema clinic with results within normal limits.  She is on anastrozole  for the past three to four months. Initially, she experienced nausea after eating and some tiredness, which have since improved. Her blood sugar levels have increased, particularly in the mornings, but her A1c remains at 6.0.  REVIEW OF SYSTEMS:  Review of Systems  Constitutional:  Positive for fatigue. Negative for appetite change, chills, fever and unexpected weight change.  HENT:   Negative for hearing loss, lump/mass and trouble swallowing.   Eyes:  Negative for eye problems and icterus.  Respiratory:  Negative for chest tightness, cough and  shortness of breath.   Cardiovascular:  Negative for chest pain, leg swelling and palpitations.  Gastrointestinal:  Negative for abdominal distention, abdominal pain, constipation, diarrhea, nausea and vomiting.  Endocrine: Negative for hot flashes.  Genitourinary:  Negative for difficulty urinating.   Musculoskeletal:  Negative for arthralgias.  Skin:  Negative for itching and rash.  Neurological:  Negative for dizziness, extremity weakness, headaches and numbness.  Hematological:  Negative for adenopathy. Does not bruise/bleed easily.   Psychiatric/Behavioral:  Negative for depression. The patient is not nervous/anxious.    Breast: Denies any new nodularity, masses, tenderness, nipple changes, or nipple discharge.       PAST MEDICAL/SURGICAL HISTORY:  Past Medical History:  Diagnosis Date   Arthritis    osteoarthritis- knees, hips, rt. hip bursitis   Blood dyscrasia    bruises easily and bleeds easy after surgery   Breast cancer (HCC) 11/2023   left breast ILV   Diabetes mellitus type 2 in obese    Hemangioma of liver    very small- evaluated and considered stable- no further problems or follow up in many years   History of kidney stones    x1 lithotripsy-has others not a bother at this time   History of palpitations    during menopause, on metoprolol . Saw cardiologist   Hypertension    Obesity (BMI 30-39.9)    Transfusion history    '1981- s/p childbirth   Past Surgical History:  Procedure Laterality Date   ABDOMINAL HYSTERECTOMY     BREAST LUMPECTOMY WITH RADIOACTIVE SEED AND SENTINEL LYMPH NODE BIOPSY Left 01/02/2024   Procedure: LEFT BREAST SEED BRACKETED LUMPECTOMY, LEFT SENTINEL LYMPH NODE MAPPING;  Surgeon: Vanderbilt Ned, MD;  Location: [facility];  Service: General;  Laterality: Left;  PEC BLOCK   CATARACT EXTRACTION, BILATERAL Bilateral    5 yrs ago   DILATION AND CURETTAGE OF UTERUS  2010   perimenopause, caused bleeding, had polyp removed   LITHOTRIPSY     palpitations     had durning menopause, saw cards and given metoprolol    ROBOTIC ASSISTED TOTAL HYSTERECTOMY WITH BILATERAL SALPINGO OOPHERECTOMY N/A 10/03/2022   Procedure: XI ROBOTIC ASSISTED TOTAL HYSTERECTOMY WITH BILATERAL SALPINGO OOPHORECTOMY;  Surgeon: Viktoria Comer SAUNDERS, MD;  Location: [facility];  Service: Gynecology;  Laterality: N/A;   SENTINEL NODE BIOPSY N/A 10/03/2022   Procedure: SENTINEL NODE BIOPSY;  Surgeon: Viktoria Comer SAUNDERS, MD;  Location: [facility];  Service: Gynecology;  Laterality: N/A;   TONSILLECTOMY      child   TOTAL KNEE ARTHROPLASTY Right 05/24/2014   Procedure: RIGHT TOTAL KNEE ARTHROPLASTY;  Surgeon: Dempsey Melodi GAILS, MD;  Location: [facility];  Service: Orthopedics;  Laterality: Right;   TOTAL KNEE ARTHROPLASTY Left 05/30/2015   Procedure: LEFT TOTAL KNEE ARTHROPLASTY;  Surgeon: Dempsey Melodi, MD;  Location: [facility];  Service: Orthopedics;  Laterality: Left;     ALLERGIES:  Allergies  Allergen Reactions   Penicillins Hives and Swelling   Tylenol  [Acetaminophen ] Other (See Comments)    Patient states she had kidney issues after this, had coke colored urine     CURRENT MEDICATIONS:  Outpatient Encounter Medications as of 06/30/2024  Medication Sig Note   Accu-Chek FastClix Lancets MISC Apply topically daily.    ACCU-CHEK GUIDE test strip daily.    anastrozole  (ARIMIDEX ) 1 MG tablet Take 1 tablet (1 mg total) by mouth daily.    atorvastatin  (LIPITOR) 20 MG tablet Take 20 mg by mouth at bedtime.    Calcium  Carb-Cholecalciferol (CALCIUM  +  VITAMIN D3 PO) Take 1 tablet by mouth every evening.    cetirizine (ZYRTEC) 10 MG tablet Take 10 mg by mouth daily. 09/18/2022: Allertec   Cholecalciferol (VITAMIN D3) 50 MCG (2000 UT) CAPS Take 2,000 Units by mouth daily.    ciclopirox (LOPROX) 0.77 % cream Apply 1 application  topically 2 (two) times daily as needed (yeast under breasts).    lisinopril-hydrochlorothiazide (PRINZIDE,ZESTORETIC) 20-12.5 MG per tablet Take 1 tablet by mouth every morning.    metFORMIN (GLUCOPHAGE-XR) 500 MG 24 hr tablet Take 500 mg by mouth every evening.    metoprolol  succinate (TOPROL -XL) 50 MG 24 hr tablet Take 50 mg by mouth every evening. Take with or immediately following a meal.    Multiple Vitamin (MULTIVITAMIN) tablet Take 1 tablet by mouth daily.    No facility-administered encounter medications on file as of 06/30/2024.     ONCOLOGIC FAMILY HISTORY:  Family History  Problem Relation Age of Onset   Liver disease Mother    Heart disease Father         ICD.  Father is 73   Prostate cancer Father 75   Colon cancer Neg Hx    Breast cancer Neg Hx    Ovarian cancer Neg Hx    Endometrial cancer Neg Hx    Pancreatic cancer Neg Hx      SOCIAL HISTORY:  Social History   Socioeconomic History   Marital status: Married    Spouse name: Not on file   Number of children: 2   Years of education: Not on file   Highest education level: Not on file  Occupational History   Occupation: retired  Tobacco Use   Smoking status: Never   Smokeless tobacco: Never  Vaping Use   Vaping status: Never Used  Substance and Sexual Activity   Alcohol use: Never   Drug use: No   Sexual activity: Yes  Other Topics Concern   Not on file  Social History Narrative   Lives with husband.     Social Drivers of Corporate investment banker Strain: Low Risk  (09/13/2022)   Overall Financial Resource Strain (CARDIA)    Difficulty of Paying Living Expenses: Not hard at all  Food Insecurity: No Food Insecurity (01/30/2024)   Hunger Vital Sign    Worried About Running Out of Food in the Last Year: Never true    Ran Out of Food in the Last Year: Never true  Transportation Needs: No Transportation Needs (01/30/2024)   PRAPARE - Administrator, Civil Service (Medical): No    Lack of Transportation (Non-Medical): No  Physical Activity: Not on file  Stress: Not on file  Social Connections: Not on file  Intimate Partner Violence: Not At Risk (01/30/2024)   Humiliation, Afraid, Rape, and Kick questionnaire    Fear of Current or Ex-Partner: No    Emotionally Abused: No    Physically Abused: No    Sexually Abused: No     OBSERVATIONS/OBJECTIVE:  BP (!) 145/72 (BP Location: Right Arm, Patient Position: Sitting)   Pulse 92   Temp (!) 97 F (36.1 C) (Temporal)   Resp 16   Wt 227 lb 12.8 oz (103.3 kg)   SpO2 97%   BMI 37.91 kg/m  GENERAL: Patient is a well appearing female in no acute distress HEENT:  Sclerae anicteric.  Oropharynx clear and moist.  No ulcerations or evidence of oropharyngeal candidiasis. Neck is supple.  NODES:  No cervical, supraclavicular, or axillary lymphadenopathy palpated.  BREAST EXAM: left breast s/p lumpectomy and radiation, no sign of local recurrence, right breast benign.   LUNGS:  Clear to auscultation bilaterally.  No wheezes or rhonchi. HEART:  Regular rate and rhythm. No murmur appreciated. ABDOMEN:  Soft, nontender.  Positive, normoactive bowel sounds. No organomegaly palpated. MSK:  No focal spinal tenderness to palpation. Full range of motion bilaterally in the upper extremities. EXTREMITIES:  No peripheral edema.   SKIN:  Clear with no obvious rashes or skin changes. No nail dyscrasia. NEURO:  Nonfocal. Well oriented.  Appropriate affect.   LABORATORY DATA:  None for this visit.  DIAGNOSTIC IMAGING:  None for this visit.      ASSESSMENT AND PLAN:  Ms.. Clute is a pleasant 73 y.o. female with Stage IA left breast invasive ductal carcinoma, ER+/PR+/HER2-, diagnosed in 11/2023, treated with lumpectomy, adjuvant radiation therapy, and anti-estrogen therapy with Anastrozole  beginning in 03/2024.  She presents to the Survivorship Clinic for our initial meeting and routine follow-up post-completion of treatment for breast cancer.    1. Stage IA left breast cancer:  Ms. Walla is continuing to recover from definitive treatment for breast cancer. She will follow-up with her medical oncologist, Dr.  Loretha in 6 months with history and physical exam per surveillance protocol.  She will continue her anti-estrogen therapy with Anastrozole . Thus far, she is tolerating the Anastrozole  well, with minimal side effects. Her mammogram is due 12/2024 she is being followed closely by Dr. Vanderbilt for Seroma management and is undergoing frequent breast imaging--so will defer to their office for mammogram ordering at [facility].   Today, a comprehensive survivorship care plan and treatment summary was reviewed with the patient today  detailing her breast cancer diagnosis, treatment course, potential late/long-term effects of treatment, appropriate follow-up care with recommendations for the future, and patient education resources.  A copy of this summary, along with a letter will be sent to the patient's primary care provider via mail/fax/In Basket message after today's visit.    2. Bone health:  Given Ms. Coone's age/history of breast cancer and her current treatment regimen including anti-estrogen therapy with Anastrozole , she is at risk for bone demineralization.  Her last DEXA scan was 04/13/2024 and demonstrated osteopenia with a t score of -2.1 in the right femoral neck.  She is recommended to undergo repeat testing in 03/2026.She was given education on specific activities to promote bone health.  3. Cancer screening:  Due to Ms. Waggoner's history and her age, she should receive screening for skin cancers, colon cancer, and gynecologic cancers.  The information and recommendations are listed on the patient's comprehensive care plan/treatment summary and were reviewed in detail with the patient.    4. Health maintenance and wellness promotion: Ms. Butkus was encouraged to consume 5-7 servings of fruits and vegetables per day. We reviewed the Nutrition Rainbow handout.  She was also encouraged to engage in moderate to vigorous exercise for 30 minutes per day most days of the week.  She was instructed to limit her alcohol consumption and continue to abstain from tobacco use.     5. Support services/counseling: It is not uncommon for this period of the patient's cancer care trajectory to be one of many emotions and stressors.   She was given information regarding our available services and encouraged to contact me with any questions or for help enrolling in any of our support group/programs.    Follow up instructions:    -Return to cancer center in 6 months for f/u with  Dr. Loretha  -Mammogram due in 12/2024 -DEXA 03/2026 -She is  welcome to return back to the Survivorship Clinic at any time; no additional follow-up needed at this time.  -Consider referral back to survivorship as a long-term survivor for continued surveillance  The patient was provided an opportunity to ask questions and all were answered. The patient agreed with the plan and demonstrated an understanding of the instructions.   Total encounter time:40 minutes*in face-to-face visit time, chart review, lab review, care coordination, order entry, and documentation of the encounter time.    Morna Kendall, NP 06/30/24 10:21 AM Medical Oncology and Hematology [facility] [address] [place], [place] [address] Tel. [phone]    Fax. [phone]  *Total Encounter Time as defined by the Centers for Medicare and Medicaid Services includes, in addition to the face-to-face time of a patient visit (documented in the note above) non-face-to-face time: obtaining and reviewing outside history, ordering and reviewing medications, tests or procedures, care coordination (communications with other health care professionals or caregivers) and documentation in the medical record.

## 2024-07-02 ENCOUNTER — Encounter: Payer: Self-pay | Admitting: Adult Health

## 2024-07-30 DIAGNOSIS — Z9889 Other specified postprocedural states: Secondary | ICD-10-CM | POA: Diagnosis not present

## 2024-07-30 DIAGNOSIS — C50812 Malignant neoplasm of overlapping sites of left female breast: Secondary | ICD-10-CM | POA: Diagnosis not present

## 2024-07-30 DIAGNOSIS — Z170 Estrogen receptor positive status [ER+]: Secondary | ICD-10-CM | POA: Diagnosis not present

## 2024-08-10 ENCOUNTER — Ambulatory Visit

## 2024-08-17 ENCOUNTER — Emergency Department (HOSPITAL_COMMUNITY)

## 2024-08-17 ENCOUNTER — Other Ambulatory Visit: Payer: Self-pay

## 2024-08-17 ENCOUNTER — Emergency Department (HOSPITAL_COMMUNITY): Admission: EM | Admit: 2024-08-17 | Discharge: 2024-08-17 | Disposition: A | Discharge status: Home / Self Care

## 2024-08-17 ENCOUNTER — Encounter (HOSPITAL_COMMUNITY): Payer: Self-pay

## 2024-08-17 DIAGNOSIS — M19012 Primary osteoarthritis, left shoulder: Secondary | ICD-10-CM | POA: Diagnosis not present

## 2024-08-17 DIAGNOSIS — S42202A Unspecified fracture of upper end of left humerus, initial encounter for closed fracture: Secondary | ICD-10-CM | POA: Diagnosis not present

## 2024-08-17 DIAGNOSIS — S42262A Displaced fracture of lesser tuberosity of left humerus, initial encounter for closed fracture: Secondary | ICD-10-CM | POA: Diagnosis not present

## 2024-08-17 DIAGNOSIS — S42252A Displaced fracture of greater tuberosity of left humerus, initial encounter for closed fracture: Secondary | ICD-10-CM | POA: Insufficient documentation

## 2024-08-17 DIAGNOSIS — S7012XA Contusion of left thigh, initial encounter: Secondary | ICD-10-CM | POA: Diagnosis not present

## 2024-08-17 DIAGNOSIS — W19XXXA Unspecified fall, initial encounter: Secondary | ICD-10-CM | POA: Diagnosis not present

## 2024-08-17 DIAGNOSIS — S42212A Unspecified displaced fracture of surgical neck of left humerus, initial encounter for closed fracture: Secondary | ICD-10-CM | POA: Diagnosis not present

## 2024-08-17 DIAGNOSIS — W010XXA Fall on same level from slipping, tripping and stumbling without subsequent striking against object, initial encounter: Secondary | ICD-10-CM | POA: Diagnosis not present

## 2024-08-17 DIAGNOSIS — I10 Essential (primary) hypertension: Secondary | ICD-10-CM | POA: Diagnosis not present

## 2024-08-17 DIAGNOSIS — R58 Hemorrhage, not elsewhere classified: Secondary | ICD-10-CM | POA: Diagnosis not present

## 2024-08-17 DIAGNOSIS — M25519 Pain in unspecified shoulder: Secondary | ICD-10-CM | POA: Diagnosis not present

## 2024-08-17 DIAGNOSIS — M25512 Pain in left shoulder: Secondary | ICD-10-CM | POA: Diagnosis not present

## 2024-08-17 MED ORDER — OXYCODONE HCL 5 MG PO TABS: 5.0000 mg | ORAL_TABLET | ORAL | 0 refills | Status: AC | PRN

## 2024-08-17 MED ORDER — IBUPROFEN 200 MG PO TABS
400.0000 mg | ORAL_TABLET | Freq: Once | ORAL | Status: AC
  Administered 2024-08-17: 400 mg via ORAL
  Filled 2024-08-17: qty 2

## 2024-08-17 NOTE — ED Triage Notes (Addendum)
 Pt BIB EMS with reports of a fall in her backyard this morning, no loc, no blood thinners. Pt is having left shoulder pain. Pt has dehiscence to a surgical wound under her breast after falling. Sx in March.

## 2024-08-17 NOTE — ED Provider Triage Note (Signed)
 Emergency Medicine Provider Triage Evaluation Note  Morgan Frye , a 73 y.o. female  was evaluated in triage.  Pt complains of L shoulder pain after mechanical fall this morning after tripping on stump in yard while chasing after dog and landed on L shoulder. Says that surgical site for lumpectomy in march opened up again after recently seeing surgeon last week and had been healing.   Did not hit head, no LOC. Was able to ambulate without difficulty.   Denies numbness, weakness, tingling, neck pain.   Review of Systems  Positive: N/a Negative: N/a  Physical Exam  BP (!) 140/105 (BP Location: Right Arm)   Pulse 85   Temp 98.2 F (36.8 C) (Oral)   Resp 16   SpO2 98%  Gen:   Awake, no distress   Resp:  Normal effort  MSK:   Moves extremities without difficulty  Other:    Medical Decision Making  Medically screening exam initiated at 11:45 AM.  Appropriate orders placed.  Hae H Sanna was informed that the remainder of the evaluation will be completed by another provider, this initial triage assessment does not replace that evaluation, and the importance of remaining in the ED until their evaluation is complete.     Beola Terrall RAMAN, [place] 08/17/24 1149

## 2024-08-17 NOTE — Discharge Instructions (Signed)
 Take over-the-counter medication such as ibuprofen every 6 hours for baseline pain control.  We are also prescribing you narcotic she can take for breakthrough pain.  Remain in the sling until you are seen by the orthopedic doctor.  Return for sudden onset headache, vision loss, facial droop, seizures, uncontrolled nausea or vomiting, unilateral weakness, extreme lethargy or any new or worsening symptoms that are concerning to you.

## 2024-08-17 NOTE — ED Provider Notes (Signed)
 [place] [facility]MENT AT [facility] Provider Note   CSN: [id number] Arrival date & time: 08/17/24  1129     Patient presents with: Morgan Frye is a 73 y.o. female.   73 year old female presenting emergency department with pain to her left shoulder after mechanical fall.  She tripped while taking dogs out.  Did not hit her head no LOC.  Denies headache, vision changes, aphasia, seizures, nausea or vomiting.  Only complains of pain to her left shoulder.  No numbness tingling changes in sensation.  She also notes there was some minor bleeding 2 hours.  Prior lumpectomy site done in March that has had prolonged healing.   Fall       Prior to Admission medications   Medication Sig Start Date End Date Taking? Authorizing Provider  oxyCODONE  (ROXICODONE ) 5 MG immediate release tablet Take 1 tablet (5 mg total) by mouth every 4 (four) hours as needed for up to 4 days for severe pain (pain score 7-10). 08/17/24 08/21/24 Yes Leveda Kendrix, Caron PARAS, DO  Accu-Chek FastClix Lancets MISC Apply topically daily. 08/17/22   [provider]  ACCU-CHEK GUIDE test strip daily. 08/28/22   [provider]  anastrozole  (ARIMIDEX ) 1 MG tablet Take 1 tablet (1 mg total) by mouth daily. 03/26/24   Iruku, Praveena, MD  atorvastatin  (LIPITOR) 20 MG tablet Take 20 mg by mouth at bedtime.    [provider]  Calcium  Carb-Cholecalciferol (CALCIUM  + VITAMIN D3 PO) Take 1 tablet by mouth every evening.    [provider]  cetirizine (ZYRTEC) 10 MG tablet Take 10 mg by mouth daily.    [provider]  Cholecalciferol (VITAMIN D3) 50 MCG (2000 UT) CAPS Take 2,000 Units by mouth daily.    [provider]  ciclopirox (LOPROX) 0.77 % cream Apply 1 application  topically 2 (two) times daily as needed (yeast under breasts). 08/28/22   [provider]  lisinopril-hydrochlorothiazide (PRINZIDE,ZESTORETIC) 20-12.5 MG per tablet Take 1  tablet by mouth every morning.    [provider]  metFORMIN (GLUCOPHAGE-XR) 500 MG 24 hr tablet Take 500 mg by mouth every evening. 09/03/22   [provider]  metoprolol  succinate (TOPROL -XL) 50 MG 24 hr tablet Take 50 mg by mouth every evening. Take with or immediately following a meal.    [provider]  Multiple Vitamin (MULTIVITAMIN) tablet Take 1 tablet by mouth daily.    [provider]    Allergies: Penicillins and Tylenol  [acetaminophen ]    Review of Systems  Updated Vital Signs BP 121/77 (BP Location: Right Arm)   Pulse 89   Temp 98.6 F (37 C) (Oral)   Resp 15   SpO2 96%   Physical Exam Vitals and nursing note reviewed.  Constitutional:      General: She is not in acute distress.    Appearance: She is not toxic-appearing.  HENT:     Head: Normocephalic and atraumatic.     Nose: Nose normal.     Mouth/Throat:     Mouth: Mucous membranes are moist.  Eyes:     Conjunctiva/sclera: Conjunctivae normal.  Cardiovascular:     Rate and Rhythm: Normal rate and regular rhythm.  Pulmonary:     Effort: Pulmonary effort is normal.     Breath sounds: Normal breath sounds.  Abdominal:     General: Abdomen is flat. There is no distension.     Tenderness: There is no abdominal tenderness. There is no guarding  or rebound.  Musculoskeletal:     Right lower leg: No edema.     Left lower leg: No edema.     Comments: Tenderness to the left shoulder.  Normal sensation to upper extremity.  Strong pulses.  Neurovascularly intact.  Has no other bony tenderness.  Chest wall stable nontender.  Has been ambulatory without issues.  Normal sensation in lower extremities.  Skin:    General: Skin is warm.     Capillary Refill: Capillary refill takes less than 2 seconds.     Comments: Does have some bruising to her inner thigh on the left extending down to calf from a fall several days ago, but notes that she is not having any pain and the bruising is  improving.  Neurological:     Mental Status: She is alert and oriented to person, place, and time.  Psychiatric:        Mood and Affect: Mood normal.        Behavior: Behavior normal.     (all labs ordered are listed, but only abnormal results are displayed) Labs Reviewed - No data to display  EKG: None  Radiology: CT Shoulder Left Wo Contrast Result Date: 08/17/2024 EXAM: CT LEFT SHOULDER, WITHOUT IV CONTRAST 08/17/2024 06:25:40 PM TECHNIQUE: Axial images were acquired through the left shoulder without IV contrast. Reformatted images were reviewed. Automated exposure control, iterative reconstruction, and/or weight based adjustment of the mA/kV was utilized to reduce the radiation dose to as low as reasonably achievable. COMPARISON: Left shoulder radiographs 08/17/2024. CLINICAL HISTORY: Shoulder trauma, fracture of humerus or scapula. FINDINGS: BONES: Comminuted fracture of the left humeral head with fracture margins extending through the greater and lesser tuberosities, demonstrating approximately 6 mm of medial displacement of the humeral shaft along the level of the inferior greater tuberosity fracture. Transverse oblique-oriented fracture extending through the surgical neck of the left proximal humerus. No additional fracture identified. No dislocation. Acromioclavicular joint is anatomically aligned. SOFT TISSUES: Intramuscular and soft tissue edema surrounding the left proximal humerus fracture. Soft tissue swelling along the left shoulder. No acute abnormality identified in the visualized portions of the lungs. IMPRESSION: 1. Comminuted fracture of the left humeral head with fracture margins extending through the greater and lesser tuberosities and approximately 6 mm of medial displacement of the humeral shaft along the level of the inferior greater tuberosity fracture. Transverse oblique-oriented fracture extending through the surgical neck of the left proximal humerus. 2. No additional  fracture identified. 3. Soft tissue edema and swelling about the left shoulder. Electronically signed by: Harrietta Sherry MD 08/17/2024 06:43 PM EDT RP Workstation: [id number]   DG Clavicle Left Result Date: 08/17/2024 CLINICAL DATA:  Clemens this morning, left shoulder pain EXAM: LEFT CLAVICLE - 2+ VIEWS COMPARISON:  08/17/2024 FINDINGS: Frontal and lordotic views of the left clavicle are obtained. There are no acute clavicular fractures. Degenerative hypertrophic changes are seen at the acromioclavicular joint. The displaced greater tuberosity fracture of the proximal left humerus is again noted, please see preceding shoulder exam for detailed findings. Visualized portions of the lungs are clear. IMPRESSION: 1. No acute clavicular fracture. 2. Degenerative hypertrophic changes of the acromioclavicular joint. 3. Stable proximal left humeral fracture. Please see preceding shoulder exam for detailed findings. Electronically Signed   By: Ozell Daring M.D.   On: 08/17/2024 16:13   DG Shoulder Left Result Date: 08/17/2024 EXAM: 1 VIEW XRAY OF THE LEFT SHOULDER 08/17/2024 12:37:00 PM COMPARISON: None available. CLINICAL HISTORY: fall. Fall; left shoulder pain. FINDINGS: BONES  AND JOINTS: displaced fracture involving the greater tuberosity. Irregularity involving the distal clavicle and acromion could be degenerative. The scapular y views are technically degraded. SOFT TISSUES: No abnormal calcifications. Visualized lung is unremarkable. IMPRESSION: 1. Displaced  fracture of the greater tuberosity. 2. Likely degenerative irregularity of the distal clavicle and acromion. In the presence of point tenderness, recommend dedicated clavicular radiograph. Electronically signed by: Rockey Kilts MD 08/17/2024 02:03 PM EDT RP Workstation: [id number]     Procedures   Medications Ordered in the ED  ibuprofen (ADVIL) tablet 400 mg (400 mg Oral Given 08/17/24 1744)    Clinical Course as of 08/17/24 2137  Mon Aug 17, 2024  1632 DG Shoulder Left MPRESSION: 1. Displaced  fracture of the greater tuberosity. 2. Likely degenerative irregularity of the distal clavicle and acromion. In the presence of point tenderness, recommend dedicated clavicular radiograph.  Electronically signed by: Rockey Kilts MD 08/17/2024 02:03 PM EDT RP Workstation: [id number]   [TY]  1642 I examined wound to left chest wall with chaperone in room.  It appears to be chronic.  granulation tissue around edges.  There was some dried blood on the bandage, but not actively bleeding. [TY]  1643 Waiting Ortho callback. [TY]  1712 Case discussed with Dr. Elsa who is recommending CT scan for planning purposes, but can follow-up in office with Dr. Dozier next week [TY]    Clinical Course User Index [TY] Neysa Caron PARAS, DO                                 Medical Decision Making This is a 73 year old female with history of hypertension breast cancer, presenting emergency department after fall with left shoulder pain.  She is afebrile vital signs reassuring.  X-ray with displaced fracture of the greater tuberosity of humerus.  She is neurovascularly intact otherwise.  No evidence of head trauma injury.  Mentating well without focal neurologic deficits.  Did offer CT scan to evaluate for possible intracranial trauma/bleed as she does have advanced age as risk factor.  However with shared decision making, patient declined.  Feel this is reasonable given her fall was where they almost 7 hours prior to my evaluation.  Was placed in sling.  Ortho consulted.  See ED course.  Given pain medications.  Plan for discharge with Ortho follow-up.  Amount and/or Complexity of Data Reviewed Independent Historian:     Details: Neighbor notes that patient is at her baseline. External Data Reviewed:     Details: I have appears to have had prior lumpectomies and has had some difficulty healing this. Labs:     Details: Considered labs, however appears to have  isolated orthopedic injury with stable vitals and no other signs of trauma.  Low suspicion for intra-abdominal trauma pathology. Radiology: ordered. Decision-making details documented in ED Course.    Details: I do appreciate fracture to the left shoulder  Risk OTC drugs. Prescription drug management. Decision regarding hospitalization. Diagnosis or treatment significantly limited by social determinants of health.       Final diagnoses:  Closed displaced fracture of greater tuberosity of left humerus, initial encounter    ED Discharge Orders          Ordered    oxyCODONE  (ROXICODONE ) 5 MG immediate release tablet  Every 4 hours PRN        08/17/24 1659  Neysa Caron PARAS, DO 08/17/24 2137

## 2024-08-26 DIAGNOSIS — S42292A Other displaced fracture of upper end of left humerus, initial encounter for closed fracture: Secondary | ICD-10-CM | POA: Diagnosis not present

## 2024-09-07 DIAGNOSIS — S42292D Other displaced fracture of upper end of left humerus, subsequent encounter for fracture with routine healing: Secondary | ICD-10-CM | POA: Diagnosis not present

## 2024-09-21 ENCOUNTER — Ambulatory Visit

## 2024-09-28 DIAGNOSIS — S42202D Unspecified fracture of upper end of left humerus, subsequent encounter for fracture with routine healing: Secondary | ICD-10-CM | POA: Diagnosis not present

## 2024-09-29 DIAGNOSIS — M8580 Other specified disorders of bone density and structure, unspecified site: Secondary | ICD-10-CM | POA: Diagnosis not present

## 2024-09-29 DIAGNOSIS — C541 Malignant neoplasm of endometrium: Secondary | ICD-10-CM | POA: Diagnosis not present

## 2024-09-29 DIAGNOSIS — B372 Candidiasis of skin and nail: Secondary | ICD-10-CM | POA: Diagnosis not present

## 2024-10-01 DIAGNOSIS — S42202D Unspecified fracture of upper end of left humerus, subsequent encounter for fracture with routine healing: Secondary | ICD-10-CM | POA: Diagnosis not present

## 2024-10-01 DIAGNOSIS — M25612 Stiffness of left shoulder, not elsewhere classified: Secondary | ICD-10-CM | POA: Diagnosis not present

## 2024-10-13 ENCOUNTER — Telehealth: Payer: Self-pay | Admitting: Hematology and Oncology

## 2024-10-13 NOTE — Telephone Encounter (Signed)
 Spoke w patient regarding 12/28/24 appt being rescheduled to 01/05/25. Patient is aware of new appt date and time.

## 2024-12-28 ENCOUNTER — Ambulatory Visit: Admitting: Hematology and Oncology

## 2025-01-05 ENCOUNTER — Inpatient Hospital Stay: Admitting: Hematology and Oncology

## 2025-03-30 ENCOUNTER — Ambulatory Visit: Admitting: Gynecologic Oncology
